# Patient Record
Sex: Female | Born: 1950 | Race: White | Hispanic: No | Smoking: Never smoker
Health system: Southern US, Community
[De-identification: ages and names within clinical notes are randomized; demographics above are authoritative.]

## PROBLEM LIST (undated history)

## (undated) DIAGNOSIS — M797 Fibromyalgia: Secondary | ICD-10-CM

## (undated) DIAGNOSIS — K589 Irritable bowel syndrome without diarrhea: Secondary | ICD-10-CM

## (undated) DIAGNOSIS — M1990 Unspecified osteoarthritis, unspecified site: Secondary | ICD-10-CM

## (undated) DIAGNOSIS — K219 Gastro-esophageal reflux disease without esophagitis: Secondary | ICD-10-CM

## (undated) DIAGNOSIS — J45909 Unspecified asthma, uncomplicated: Secondary | ICD-10-CM

## (undated) DIAGNOSIS — Z5189 Encounter for other specified aftercare: Secondary | ICD-10-CM

## (undated) DIAGNOSIS — K5790 Diverticulosis of intestine, part unspecified, without perforation or abscess without bleeding: Secondary | ICD-10-CM

## (undated) DIAGNOSIS — Z9889 Other specified postprocedural states: Secondary | ICD-10-CM

## (undated) DIAGNOSIS — R011 Cardiac murmur, unspecified: Secondary | ICD-10-CM

## (undated) DIAGNOSIS — I493 Ventricular premature depolarization: Secondary | ICD-10-CM

## (undated) DIAGNOSIS — I499 Cardiac arrhythmia, unspecified: Secondary | ICD-10-CM

## (undated) DIAGNOSIS — R112 Nausea with vomiting, unspecified: Secondary | ICD-10-CM

## (undated) DIAGNOSIS — J301 Allergic rhinitis due to pollen: Secondary | ICD-10-CM

## (undated) DIAGNOSIS — F5104 Psychophysiologic insomnia: Secondary | ICD-10-CM

## (undated) DIAGNOSIS — E785 Hyperlipidemia, unspecified: Secondary | ICD-10-CM

## (undated) DIAGNOSIS — D649 Anemia, unspecified: Secondary | ICD-10-CM

## (undated) DIAGNOSIS — D689 Coagulation defect, unspecified: Secondary | ICD-10-CM

## (undated) DIAGNOSIS — E039 Hypothyroidism, unspecified: Secondary | ICD-10-CM

## (undated) DIAGNOSIS — I10 Essential (primary) hypertension: Secondary | ICD-10-CM

## (undated) DIAGNOSIS — J349 Unspecified disorder of nose and nasal sinuses: Secondary | ICD-10-CM

## (undated) DIAGNOSIS — I4891 Unspecified atrial fibrillation: Secondary | ICD-10-CM

## (undated) HISTORY — DX: Cardiac murmur, unspecified: R01.1

## (undated) HISTORY — PX: HERNIA REPAIR: SHX51

## (undated) HISTORY — DX: Coagulation defect, unspecified: D68.9

## (undated) HISTORY — PX: BACK SURGERY: SHX140

## (undated) HISTORY — DX: Encounter for other specified aftercare: Z51.89

## (undated) HISTORY — PX: EYE SURGERY: SHX253

## (undated) HISTORY — PX: TUBAL LIGATION: SHX77

## (undated) HISTORY — PX: GIVENS CAPSULE STUDY: SHX5432

## (undated) HISTORY — PX: JOINT REPLACEMENT: SHX530

---

## 2005-07-05 ENCOUNTER — Other Ambulatory Visit: Payer: Self-pay

## 2005-07-05 ENCOUNTER — Emergency Department: Payer: Self-pay | Admitting: Unknown Physician Specialty

## 2006-11-07 HISTORY — PX: JOINT REPLACEMENT: SHX530

## 2007-09-11 ENCOUNTER — Other Ambulatory Visit: Payer: Self-pay

## 2007-09-11 ENCOUNTER — Ambulatory Visit: Payer: Self-pay | Admitting: Unknown Physician Specialty

## 2007-09-24 ENCOUNTER — Inpatient Hospital Stay: Payer: Self-pay | Admitting: Unknown Physician Specialty

## 2007-10-26 ENCOUNTER — Encounter: Payer: Self-pay | Admitting: Unknown Physician Specialty

## 2007-11-08 ENCOUNTER — Encounter: Payer: Self-pay | Admitting: Unknown Physician Specialty

## 2007-12-09 ENCOUNTER — Encounter: Payer: Self-pay | Admitting: Unknown Physician Specialty

## 2008-01-06 ENCOUNTER — Encounter: Payer: Self-pay | Admitting: Unknown Physician Specialty

## 2010-06-19 ENCOUNTER — Emergency Department: Payer: Self-pay | Admitting: Unknown Physician Specialty

## 2011-05-23 ENCOUNTER — Ambulatory Visit: Payer: Self-pay

## 2014-02-10 ENCOUNTER — Institutional Professional Consult (permissible substitution): Payer: Self-pay | Admitting: Pulmonary Disease

## 2016-10-11 ENCOUNTER — Other Ambulatory Visit: Payer: Self-pay | Admitting: Family Medicine

## 2016-10-11 DIAGNOSIS — I480 Paroxysmal atrial fibrillation: Secondary | ICD-10-CM | POA: Insufficient documentation

## 2016-10-11 DIAGNOSIS — K219 Gastro-esophageal reflux disease without esophagitis: Secondary | ICD-10-CM | POA: Insufficient documentation

## 2016-10-11 DIAGNOSIS — J301 Allergic rhinitis due to pollen: Secondary | ICD-10-CM | POA: Insufficient documentation

## 2016-10-11 DIAGNOSIS — M797 Fibromyalgia: Secondary | ICD-10-CM | POA: Insufficient documentation

## 2016-10-11 DIAGNOSIS — F5104 Psychophysiologic insomnia: Secondary | ICD-10-CM | POA: Insufficient documentation

## 2016-10-11 DIAGNOSIS — F3342 Major depressive disorder, recurrent, in full remission: Secondary | ICD-10-CM | POA: Insufficient documentation

## 2016-10-11 DIAGNOSIS — Z1231 Encounter for screening mammogram for malignant neoplasm of breast: Secondary | ICD-10-CM

## 2016-10-18 ENCOUNTER — Emergency Department
Admission: EM | Admit: 2016-10-18 | Discharge: 2016-10-18 | Disposition: A | Discharge status: Home / Self Care | Payer: Medicare Other | Attending: Emergency Medicine | Admitting: Emergency Medicine

## 2016-10-18 ENCOUNTER — Encounter: Payer: Self-pay | Admitting: Emergency Medicine

## 2016-10-18 ENCOUNTER — Ambulatory Visit (INDEPENDENT_AMBULATORY_CARE_PROVIDER_SITE_OTHER)
Admission: EM | Admit: 2016-10-18 | Discharge: 2016-10-18 | Disposition: A | Discharge status: Other Acute Inpatient Hospital | Payer: Medicare Other | Source: Home / Self Care | Attending: Family Medicine | Admitting: Family Medicine

## 2016-10-18 ENCOUNTER — Emergency Department: Payer: Medicare Other

## 2016-10-18 DIAGNOSIS — R0789 Other chest pain: Secondary | ICD-10-CM

## 2016-10-18 DIAGNOSIS — I498 Other specified cardiac arrhythmias: Secondary | ICD-10-CM | POA: Insufficient documentation

## 2016-10-18 DIAGNOSIS — Z79899 Other long term (current) drug therapy: Secondary | ICD-10-CM | POA: Insufficient documentation

## 2016-10-18 DIAGNOSIS — Z8679 Personal history of other diseases of the circulatory system: Secondary | ICD-10-CM

## 2016-10-18 DIAGNOSIS — J45909 Unspecified asthma, uncomplicated: Secondary | ICD-10-CM | POA: Insufficient documentation

## 2016-10-18 DIAGNOSIS — I10 Essential (primary) hypertension: Secondary | ICD-10-CM | POA: Insufficient documentation

## 2016-10-18 DIAGNOSIS — R002 Palpitations: Secondary | ICD-10-CM | POA: Insufficient documentation

## 2016-10-18 HISTORY — DX: Unspecified atrial fibrillation: I48.91

## 2016-10-18 HISTORY — DX: Unspecified asthma, uncomplicated: J45.909

## 2016-10-18 HISTORY — DX: Essential (primary) hypertension: I10

## 2016-10-18 LAB — CBC
HCT: 37.1 % (ref 35.0–47.0)
Hemoglobin: 12.3 g/dL (ref 12.0–16.0)
MCH: 28.1 pg (ref 26.0–34.0)
MCHC: 33.2 g/dL (ref 32.0–36.0)
MCV: 84.7 fL (ref 80.0–100.0)
PLATELETS: 340 10*3/uL (ref 150–440)
RBC: 4.39 MIL/uL (ref 3.80–5.20)
RDW: 13.7 % (ref 11.5–14.5)
WBC: 9.4 10*3/uL (ref 3.6–11.0)

## 2016-10-18 LAB — BASIC METABOLIC PANEL
Anion gap: 8 (ref 5–15)
BUN: 12 mg/dL (ref 6–20)
CHLORIDE: 109 mmol/L (ref 101–111)
CO2: 23 mmol/L (ref 22–32)
CREATININE: 0.7 mg/dL (ref 0.44–1.00)
Calcium: 9.9 mg/dL (ref 8.9–10.3)
GFR calc non Af Amer: >60 mL/min (ref >60)
Glucose, Bld: 92 mg/dL (ref 65–99)
Potassium: 3.7 mmol/L (ref 3.5–5.1)
SODIUM: 140 mmol/L (ref 135–145)

## 2016-10-18 LAB — TROPONIN I

## 2016-10-18 MED ORDER — ASPIRIN 81 MG PO CHEW
324.0000 mg | CHEWABLE_TABLET | Freq: Once | ORAL | Status: AC
  Administered 2016-10-18: 324 mg via ORAL

## 2016-10-18 NOTE — ED Triage Notes (Signed)
Patient c/o chest pain and palpitation that started 7am this morning.

## 2016-10-18 NOTE — Discharge Instructions (Signed)
Please seek medical attention for any high fevers, chest pain, shortness of breath, change in behavior, persistent vomiting, bloody stool or any other new or concerning symptoms.  

## 2016-10-18 NOTE — ED Provider Notes (Signed)
[facility] Emergency Department Provider Note  ____________________________________________   I have reviewed the triage vital signs and the nursing notes.   HISTORY  Chief Complaint Palpitations   History limited by: Not Limited   HPI Mary Irwin is a 65 y.o. female who presents to the emergency department today because of concern for palpitations. The palpitations started roughly 12 hours prior to my evaluation. The patient describes them as an odd feeling in her chest. There was no associated chest pain. There was no associated shortness of breath. The patient had just woken up when she first appreciated the odd feeling. The patient states that they were worsened when she sat up. The patient has had palpitations in the past. History of atrial fibrillation. Is on Cardizem. No recent illness or fevers. No excessive caffeine or alcohol use recently.   Past Medical History:  Diagnosis Date  . Asthma   . Atrial fibrillation ([place])   . Hypertension     There are no active problems to display for this patient.   History reviewed. No pertinent surgical history.  Prior to Admission medications   Medication Sig Start Date End Date Taking? Authorizing Provider  albuterol (PROVENTIL HFA;VENTOLIN HFA) 108 (90 Base) MCG/ACT inhaler Inhale 2 puffs into the lungs every 6 (six) hours as needed for wheezing or shortness of breath.    Historical Provider, MD  diltiazem (CARDIZEM CD) 120 MG 24 hr capsule Take 120 mg by mouth daily.    Historical Provider, MD  fluticasone furoate-vilanterol (BREO ELLIPTA) 200-25 MCG/INH AEPB Inhale 1 puff into the lungs daily.    Historical Provider, MD  gabapentin (NEURONTIN) 300 MG capsule Take 300 mg by mouth 2 (two) times daily.    Historical Provider, MD  lisinopril (PRINIVIL,ZESTRIL) 20 MG tablet Take 20 mg by mouth daily.    Historical Provider, MD  omeprazole (PRILOSEC) 40 MG capsule Take 40 mg by mouth daily.    Historical  Provider, MD    Allergies Patient has no known allergies.  No family history on file.  Social History Social History  Substance Use Topics  . Smoking status: Never Smoker  . Smokeless tobacco: Never Used  . Alcohol use No    Review of Systems  Constitutional: Negative for fever. Cardiovascular: Positive for palpitations. Negative for chest pain. Respiratory: Negative for shortness of breath. Gastrointestinal: Negative for abdominal pain, vomiting and diarrhea. Neurological: Negative for headaches, focal weakness or numbness.  10-point ROS otherwise negative.  ____________________________________________   PHYSICAL EXAM:  VITAL SIGNS: ED Triage Vitals  Enc Vitals Group     BP 10/18/16 1544 (!) 154/62     Pulse Rate 10/18/16 1544 75     Resp 10/18/16 1544 20     Temp 10/18/16 1544 98.1 F (36.7 C)     Temp Source 10/18/16 1544 Oral     SpO2 10/18/16 1544 99 %     Weight 10/18/16 1545 204 lb (92.5 kg)     Height 10/18/16 1545 5\' 5"  (1.651 m)     Head Circumference --      Peak Flow --      Pain Score --      Pain Loc --      Pain Edu? --      Excl. in [place]? --      Constitutional: Alert and oriented. Well appearing and in no distress. Eyes: Conjunctivae are normal. Normal extraocular movements. ENT   Head: Normocephalic and atraumatic.   Nose: No congestion/rhinnorhea.  Mouth/Throat: Mucous membranes are moist.   Neck: No stridor. Hematological/Lymphatic/Immunilogical: No cervical lymphadenopathy. Cardiovascular: Normal rate, regular rhythm.  No murmurs, rubs, or gallops. Did appreciate one skipped beat while listening to the heart. Respiratory: Normal respiratory effort without tachypnea nor retractions. Breath sounds are clear and equal bilaterally. No wheezes/rales/rhonchi. Musculoskeletal: Normal range of motion in all extremities. Neurologic:  Normal speech and language. No gross focal neurologic deficits are appreciated.  Skin:  Skin is  warm, dry and intact. No rash noted. Psychiatric: Mood and affect are normal. Speech and behavior are normal. Patient exhibits appropriate insight and judgment.  ____________________________________________    LABS (pertinent positives/negatives)  Labs Reviewed  BASIC METABOLIC PANEL  CBC  TROPONIN I     ____________________________________________   EKG  I, Nance Pear, attending physician, personally viewed and interpreted this EKG  EKG Time: 1424 Rate: 84 Rhythm: normal sinus rhythm Axis: normal Intervals: qtc 441 QRS: narrow ST changes: no st elevation Impression: normal ekg  ____________________________________________    RADIOLOGY  CXR IMPRESSION:  Mild chronic bronchitic changes. No alveolar pneumonia nor CHF.    Thoracic aortic atherosclerosis.   ____________________________________________   PROCEDURES  Procedures  ____________________________________________   INITIAL IMPRESSION / ASSESSMENT AND PLAN / ED COURSE  Pertinent labs & imaging results that were available during my care of the patient were reviewed by me and considered in my medical decision making (see chart for details).  Patient presented to the emergency department today because of concerns for palpitations. Patient states she does have history of A. fib. EKG here without any concerning findings. Blood work without any elevation troponin. Chest x-ray normal. This point did it sound like I heard one likely PVC on auscultation. The patient patient's EKG without any concerning findings. Will discharge. Will give patient cardiology follow-up information.  ____________________________________________   FINAL CLINICAL IMPRESSION(S) / ED DIAGNOSES  Palpitations  Note: This dictation was prepared with Dragon dictation. Any transcriptional errors that result from this process are unintentional      Nance Pear, MD 10/18/16 1920

## 2016-10-18 NOTE — ED Provider Notes (Signed)
[facility]    CSN: [id number] Arrival date & time: 10/18/16  1404     History   Chief Complaint Chief Complaint  Patient presents with  . Chest Pain  . Tachycardia    HPI Mary Irwin is a 65 y.o. female.   Patient is a 65 year old white female who's complaining of atypical chest pain. Patient reports the chest pain started this morning. She reports is a chest heaviness versus a true chest pain. It does not radiate. Should she feel heart fluttering and jumping. She does have a new diagnosis of A. fib which occurred this summer when she was living in [place]. She was transported by EMS to the local hospital there and actually admitted and started on Cardizem. She states that she was going up reported on a blood thinner but her insurance would not by it or pain for so she was put on 1 baby aspirin a day. Habits she does not smoke or dip snuff her to tobacco. She does have a history of multiple medical problems she has asthma A. fib fibromyalgia and hypothyroidism. 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 She has a sisters had A. fib but no history of sudden death in the immediate family. She once again reports this is a chest tightness and not really pain but she can't feel her heart skipping she states he has associated with a new primary care doctor since she's moved from [place] and they were going to refer to cardiologist referral has not taken place for the recent. Called her PCP twice today but she received no answer she was at the urgent care taking her sister to have an ultrasound of her hip who had recent hip replacement so she decided to come to the urgent care to be seen and evaluated.   The history is provided by the patient. No language interpreter was used.  Chest Pain  Pain location:  Unable to specify Pain quality: aching and shooting   Pain severity:  Moderate Onset quality:  Sudden Timing:  Constant Progression:  Waxing and  waning Chronicity:  New Context: breathing   Relieved by:  Nothing Worsened by:  Nothing Ineffective treatments:  None tried Associated symptoms: palpitations and shortness of breath     Past Medical History:  Diagnosis Date  . Asthma   . Atrial fibrillation ([place])   . Hypertension     There are no active problems to display for this patient.   History reviewed. No pertinent surgical history.  OB History    No data available       Home Medications    Prior to Admission medications   Medication Sig Start Date End Date Taking? Authorizing Provider  albuterol (PROVENTIL HFA;VENTOLIN HFA) 108 (90 Base) MCG/ACT inhaler Inhale 2 puffs into the lungs every 6 (six) hours as needed for wheezing or shortness of breath.   Yes Historical Provider, MD  diltiazem (CARDIZEM CD) 120 MG 24 hr capsule Take 120 mg by mouth daily.   Yes Historical Provider, MD  fluticasone furoate-vilanterol (BREO ELLIPTA) 200-25 MCG/INH AEPB Inhale 1 puff into the lungs daily.   Yes Historical Provider, MD  gabapentin (NEURONTIN) 300 MG capsule Take 300 mg by mouth 2 (two) times daily.   Yes Historical Provider, MD  lisinopril (PRINIVIL,ZESTRIL) 20 MG tablet Take 20 mg by mouth daily.   Yes Historical Provider, MD  omeprazole (PRILOSEC) 40 MG capsule Take 40 mg by mouth daily.   Yes Historical  Provider, MD    Family History History reviewed. No pertinent family history.  Social History Social History  Substance Use Topics  . Smoking status: Never Smoker  . Smokeless tobacco: Never Used  . Alcohol use No     Allergies   Patient has no known allergies.   Review of Systems Review of Systems  Respiratory: Positive for chest tightness and shortness of breath.   Cardiovascular: Positive for chest pain and palpitations.  All other systems reviewed and are negative.    Physical Exam Triage Vital Signs ED Triage Vitals  Enc Vitals Group     BP 10/18/16 1426 (!) 171/70     Pulse Rate 10/18/16  1426 83     Resp 10/18/16 1426 17     Temp 10/18/16 1426 98.4 F (36.9 C)     Temp Source 10/18/16 1426 Oral     SpO2 10/18/16 1426 100 %     Weight 10/18/16 1420 204 lb (92.5 kg)     Height 10/18/16 1420 5\' 5"  (1.651 m)     Head Circumference --      Peak Flow --      Pain Score 10/18/16 1422 4     Pain Loc --      Pain Edu? --      Excl. in [place]? --    No data found.   Updated Vital Signs BP (!) 171/70 (BP Location: Left Arm)   Pulse 83   Temp 98.4 F (36.9 C) (Oral)   Resp 17   Ht 5\' 5"  (1.651 m)   Wt 204 lb (92.5 kg)   SpO2 100%   BMI 33.95 kg/m   Visual Acuity Right Eye Distance:   Left Eye Distance:   Bilateral Distance:    Right Eye Near:   Left Eye Near:    Bilateral Near:     Physical Exam  Constitutional: She is oriented to person, place, and time. She appears well-developed and well-nourished.  HENT:  Head: Normocephalic and atraumatic.  Left Ear: External ear normal.  Eyes: Pupils are equal, round, and reactive to light.  Neck: Normal range of motion. Neck supple.  Cardiovascular: Normal rate and normal heart sounds.  An irregular rhythm present.  Pulmonary/Chest: Effort normal and breath sounds normal. No respiratory distress.  Abdominal: Soft. Bowel sounds are normal. She exhibits no distension.  Musculoskeletal: Normal range of motion. She exhibits no edema or tenderness.  Neurological: She is alert and oriented to person, place, and time. No cranial nerve deficit.  Skin: Skin is warm and dry.  Psychiatric: She has a normal mood and affect. Her behavior is normal.  Vitals reviewed.    UC Treatments / Results  Labs (all labs ordered are listed, but only abnormal results are displayed) Labs Reviewed - No data to display  EKG  EKG Interpretation None      ED ECG REPORT I, Nikoleta Dady H, the attending physician, personally viewed and interpreted this ECG.   Date: 10/18/2016  EKG Time:14:24:06  Rate: 82  Rhythm: there are no previous  tracings available for comparison, nonspecific ST and T waves changes, sinus arrthymia  Axis: 82  Intervals:none and none  ST&T Change:Nonspecific ST changes  Radiology No results found.  Procedures Procedures (including critical care time)  Medications Ordered in UC Medications  aspirin chewable tablet 324 mg (324 mg Oral Given 10/18/16 1457)     Initial Impression / Assessment and Plan / UC Course  I have reviewed the triage vital  signs and the nursing notes.  Pertinent labs & imaging results that were available during my care of the patient were reviewed by me and considered in my medical decision making (see chart for details).  Clinical Course    Explained patient EKG showed normal sinus rhythm with some sinus arrhythmia and nonspecific ST changes. No EKG from [place] is available. Explained this could be the beginning of something worse strongly suggest she goes to the ED of her choice [facility] ED was notified of patient's admit arrival and discussed with charge nurse Colletta [place]. Patient was given 4 baby aspirin as before she left. Patient states that she wants to take her sister home I suggested that she go to [facility] ED by ambulance which she refuses and since she's gone go by PCP I recommend that she gets we'll also drop her sister off and that she sees [facility] ED ASAP.   Final Clinical Impressions(s) / UC Diagnoses   Final diagnoses:  None    New Prescriptions Discharge Medication List as of 10/18/2016  3:00 PM       Note: This dictation was prepared with Dragon dictation along with smaller phrase technology. Any transcriptional errors that result from this process are unintentional.   Frederich Cha, MD 10/18/16 1534

## 2016-10-25 DIAGNOSIS — I10 Essential (primary) hypertension: Secondary | ICD-10-CM | POA: Insufficient documentation

## 2016-10-25 DIAGNOSIS — J45909 Unspecified asthma, uncomplicated: Secondary | ICD-10-CM | POA: Insufficient documentation

## 2016-10-25 DIAGNOSIS — E079 Disorder of thyroid, unspecified: Secondary | ICD-10-CM | POA: Insufficient documentation

## 2016-10-25 DIAGNOSIS — E785 Hyperlipidemia, unspecified: Secondary | ICD-10-CM | POA: Insufficient documentation

## 2017-01-03 ENCOUNTER — Ambulatory Visit: Admission: RE | Admit: 2017-01-03 | Payer: Medicare Other | Source: Ambulatory Visit

## 2017-01-05 ENCOUNTER — Ambulatory Visit
Admission: RE | Admit: 2017-01-05 | Discharge: 2017-01-05 | Disposition: A | Discharge status: Home / Self Care | Payer: Medicare Other | Source: Ambulatory Visit | Attending: Medical Oncology | Admitting: Medical Oncology

## 2017-01-05 ENCOUNTER — Other Ambulatory Visit: Payer: Self-pay | Admitting: Medical Oncology

## 2017-01-05 DIAGNOSIS — Z1231 Encounter for screening mammogram for malignant neoplasm of breast: Secondary | ICD-10-CM | POA: Diagnosis present

## 2017-02-01 ENCOUNTER — Ambulatory Visit
Admission: RE | Admit: 2017-02-01 | Discharge: 2017-02-01 | Disposition: A | Discharge status: Home / Self Care | Payer: Medicare Other | Source: Ambulatory Visit | Attending: Medical Oncology | Admitting: Medical Oncology

## 2017-02-01 ENCOUNTER — Other Ambulatory Visit: Payer: Self-pay | Admitting: Medical Oncology

## 2017-02-01 ENCOUNTER — Ambulatory Visit: Admission: RE | Admit: 2017-02-01 | Payer: Medicare Other | Source: Ambulatory Visit

## 2017-02-01 DIAGNOSIS — M79604 Pain in right leg: Secondary | ICD-10-CM

## 2017-02-01 DIAGNOSIS — M25562 Pain in left knee: Secondary | ICD-10-CM | POA: Diagnosis not present

## 2017-02-01 DIAGNOSIS — R2243 Localized swelling, mass and lump, lower limb, bilateral: Secondary | ICD-10-CM

## 2017-02-01 DIAGNOSIS — R609 Edema, unspecified: Secondary | ICD-10-CM | POA: Diagnosis not present

## 2017-02-01 DIAGNOSIS — M79605 Pain in left leg: Principal | ICD-10-CM

## 2017-02-01 DIAGNOSIS — Z780 Asymptomatic menopausal state: Secondary | ICD-10-CM

## 2017-02-01 DIAGNOSIS — Z0000 Encounter for general adult medical examination without abnormal findings: Secondary | ICD-10-CM

## 2017-02-01 DIAGNOSIS — Z13820 Encounter for screening for osteoporosis: Secondary | ICD-10-CM

## 2017-02-12 ENCOUNTER — Ambulatory Visit
Admission: EM | Admit: 2017-02-12 | Discharge: 2017-02-12 | Disposition: A | Discharge status: Home / Self Care | Payer: Medicare Other | Attending: Family | Admitting: Family

## 2017-02-12 DIAGNOSIS — M25551 Pain in right hip: Secondary | ICD-10-CM | POA: Diagnosis not present

## 2017-02-12 DIAGNOSIS — J4541 Moderate persistent asthma with (acute) exacerbation: Secondary | ICD-10-CM | POA: Diagnosis not present

## 2017-02-12 DIAGNOSIS — J069 Acute upper respiratory infection, unspecified: Secondary | ICD-10-CM | POA: Diagnosis not present

## 2017-02-12 DIAGNOSIS — B9789 Other viral agents as the cause of diseases classified elsewhere: Secondary | ICD-10-CM

## 2017-02-12 MED ORDER — BENZONATATE 100 MG PO CAPS: 100.0000 mg | ORAL_CAPSULE | Freq: Three times a day (TID) | ORAL | 0 refills | Status: DC | PRN

## 2017-02-12 MED ORDER — PREDNISONE 10 MG (21) PO TBPK: ORAL_TABLET | ORAL | 0 refills | Status: DC

## 2017-02-12 NOTE — Discharge Instructions (Signed)
Recommend start Prednisone 10mg  6 day dose pack as directed. May take Tessalon cough pills 1 every 8 hours as needed. Increase fluid intake to help loosen up mucus. Use Albuterol inhaler sparingly since it has caused irregular heart rate in the past. May continue Tylenol as needed for fever. Follow-up with your primary care provider in 3 to 4 days if not improving.

## 2017-02-12 NOTE — ED Provider Notes (Signed)
CSN: [id number]     Arrival date & time 02/12/17  1405 History   First MD Initiated Contact with Patient 02/12/17 1525     Chief Complaint  Patient presents with  . Cough  . Hip Pain    right   (Consider location/radiation/quality/duration/timing/severity/associated sxs/prior Treatment) 66 year old female presents with 2 major concerns. 1st is low grade fever and cough for the past 3 days. Cough was productive yesterday but now more of a dry cough. Denies any nasal congestion, sore throat, vomiting or diarrhea. Has taken Tylenol with some relief. No other family members have been ill. Has history of asthma and the cough is causing an increase in shortness of breath and wheezes. She is currently on Breo but is unable to take her Albuterol due to increase in palpitations from the inhaler recently. 2nd concern is right sided hip pain that has been occurring for the past week. She was sitting in a patio chair and when she tried to get up, she started experiencing low back pain that has spread to her right hip. No distinct injury but has had chronic back problems as well as both hips replaced about 10 years ago. She has been applying ice to her hip with some success. 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    The history is provided by the patient.    Past Medical History:  Diagnosis Date  . Asthma   . Atrial fibrillation ([place])   . Hypertension    History reviewed. No pertinent surgical history. Family History  Problem Relation Age of Onset  . Breast cancer Maternal Aunt 64   Social History  Substance Use Topics  . Smoking status: Never Smoker  . Smokeless tobacco: Never Used  . Alcohol use No   OB History    No data available     Review of Systems  Constitutional: Positive for fatigue and fever. Negative for appetite change and chills.  HENT: Positive for postnasal drip. Negative for congestion, ear discharge, ear pain, rhinorrhea, sinus pain,  sinus pressure, sore throat and trouble swallowing.   Eyes: Negative for discharge.  Respiratory: Positive for cough, chest tightness, shortness of breath and wheezing.   Cardiovascular: Negative for chest pain.  Gastrointestinal: Positive for nausea. Negative for abdominal pain, diarrhea and vomiting.  Genitourinary: Negative for decreased urine volume, difficulty urinating, dysuria and vaginal discharge.  Musculoskeletal: Positive for arthralgias, back pain and myalgias. Negative for joint swelling, neck pain and neck stiffness.  Skin: Negative for rash and wound.  Neurological: Negative for dizziness, seizures, syncope, weakness and headaches.  Hematological: Negative for adenopathy.    Allergies  Patient has no known allergies.  Home Medications   Prior to Admission medications   Medication Sig Start Date End Date Taking? Authorizing Provider  albuterol (PROVENTIL HFA;VENTOLIN HFA) 108 (90 Base) MCG/ACT inhaler Inhale 2 puffs into the lungs every 6 (six) hours as needed for wheezing or shortness of breath.    Historical Provider, MD  benzonatate (TESSALON) 100 MG capsule Take 1 capsule (100 mg total) by mouth 3 (three) times daily as needed for cough. 02/12/17   Katy Apo, NP  diltiazem (CARDIZEM CD) 120 MG 24 hr capsule Take 120 mg by mouth daily.    Historical Provider, MD  fluticasone furoate-vilanterol (BREO ELLIPTA) 200-25 MCG/INH AEPB Inhale 1 puff into the lungs daily.    Historical Provider, MD  gabapentin (NEURONTIN) 300 MG capsule Take 300 mg by mouth  2 (two) times daily.    Historical Provider, MD  lisinopril (PRINIVIL,ZESTRIL) 20 MG tablet Take 20 mg by mouth daily.    Historical Provider, MD  omeprazole (PRILOSEC) 40 MG capsule Take 40 mg by mouth daily.    Historical Provider, MD  predniSONE (STERAPRED UNI-PAK 21 TAB) 10 MG (21) TBPK tablet Take 6 tabs by mouth 1st day then decrease by 1 tablet daily until finished. 02/12/17   Katy Apo, NP   Meds Ordered and  Administered this Visit  Medications - No data to display  BP (!) 135/55 (BP Location: Left Arm)   Pulse (!) 105   Temp 98.4 F (36.9 C) (Oral)   Resp 18   Ht 5\' 4"  (1.626 m)   Wt 200 lb (90.7 kg)   SpO2 100%   BMI 34.33 kg/m  No data found.   Physical Exam  Constitutional: She is oriented to person, place, and time. She appears well-developed and well-nourished. No distress.  HENT:  Head: Normocephalic and atraumatic.  Right Ear: Hearing, tympanic membrane, external ear and ear canal normal.  Left Ear: Hearing, tympanic membrane, external ear and ear canal normal.  Nose: Nose normal. Right sinus exhibits no maxillary sinus tenderness and no frontal sinus tenderness. Left sinus exhibits no maxillary sinus tenderness and no frontal sinus tenderness.  Mouth/Throat: Uvula is midline, oropharynx is clear and moist and mucous membranes are normal.  Neck: Normal range of motion. Neck supple.  Cardiovascular: Regular rhythm and normal heart sounds.  Tachycardia present.   No murmur heard. Pulmonary/Chest: Effort normal. No respiratory distress. She has decreased breath sounds in the right upper field, the right lower field, the left upper field and the left lower field. She has no wheezes. She has rhonchi in the right upper field and the left upper field. She has no rales.  Musculoskeletal: She exhibits tenderness. She exhibits no edema or deformity.       Right hip: She exhibits tenderness. She exhibits normal range of motion, normal strength, no swelling, no crepitus and no deformity.       Legs: Has full range of motion of right hip but with pain on flexion and full extension. Tender along lower lumbar area as well as at iliac crest. No swelling, redness or distinct muscle spasms present. Pulses are normal distally with no distinct edema. No neuro deficits noted.   Lymphadenopathy:    She has no cervical adenopathy.  Neurological: She is alert and oriented to person, place, and time.  She has normal strength. No cranial nerve deficit or sensory deficit.  Skin: Skin is warm and dry. Capillary refill takes less than 2 seconds. No rash noted.  Psychiatric: She has a normal mood and affect. Her behavior is normal. Judgment and thought content normal.    Urgent Care Course     Procedures (including critical care time)  Labs Review Labs Reviewed - No data to display  Imaging Review No results found.   Visual Acuity Review  Right Eye Distance:   Left Eye Distance:   Bilateral Distance:    Right Eye Near:   Left Eye Near:    Bilateral Near:         MDM   1. Right hip pain   2. Moderate persistent asthma with acute exacerbation   3. Viral URI with cough    Discussed that she probably has a upper respiratory viral infection causing her cough. Recommend Tessalon cough pills 1 every 8 hours as needed. Increase  fluid intake to help loosen up mucus. May continue Tylenol as needed for fever. Due to asthma exacerbation and right hip pain, recommend trial Prednisone 10mg  6 day dose pack as directed. Minimize use of Albuterol since it aggravates irregular heart rate. Discussed that right lower back pain and hip pain are probably due to musculoskeletal strain. May continue applying ice as needed. Declines muscle relaxer. May need to see the Orthopedic again for further evaluation if hip pain persists. Also recommend follow-up with her PCP if cough does not improve within 3 to 4 days.     Katy Apo, NP 02/12/17 2233

## 2017-02-12 NOTE — ED Triage Notes (Addendum)
Pt c/o cough for the last 3 days, 101 fever at home yesterday. Pt has disc problems but about a week ago she was getting out of a chair and her back started to hurt, she has had both hips replaced about 10 years ago. and she has been keeping ice on it. But states her right hip is hurting. Feels like a pulled muscle in the hip

## 2017-02-13 ENCOUNTER — Ambulatory Visit: Admission: RE | Admit: 2017-02-13 | Payer: Medicare Other | Source: Ambulatory Visit

## 2017-02-20 ENCOUNTER — Ambulatory Visit
Admission: RE | Admit: 2017-02-20 | Discharge: 2017-02-20 | Disposition: A | Discharge status: Home / Self Care | Payer: Medicare Other | Source: Ambulatory Visit | Attending: Medical Oncology | Admitting: Medical Oncology

## 2017-02-20 DIAGNOSIS — Z13820 Encounter for screening for osteoporosis: Secondary | ICD-10-CM | POA: Insufficient documentation

## 2017-02-20 DIAGNOSIS — Z0000 Encounter for general adult medical examination without abnormal findings: Secondary | ICD-10-CM

## 2017-02-20 DIAGNOSIS — Z780 Asymptomatic menopausal state: Secondary | ICD-10-CM | POA: Insufficient documentation

## 2017-02-28 ENCOUNTER — Other Ambulatory Visit: Payer: Self-pay | Admitting: Orthopedic Surgery

## 2017-02-28 DIAGNOSIS — S22060A Wedge compression fracture of T7-T8 vertebra, initial encounter for closed fracture: Secondary | ICD-10-CM

## 2017-03-08 ENCOUNTER — Ambulatory Visit
Admission: RE | Admit: 2017-03-08 | Discharge: 2017-03-08 | Disposition: A | Discharge status: Home / Self Care | Payer: Medicare Other | Source: Ambulatory Visit | Attending: Orthopedic Surgery | Admitting: Orthopedic Surgery

## 2017-03-08 DIAGNOSIS — M47814 Spondylosis without myelopathy or radiculopathy, thoracic region: Secondary | ICD-10-CM | POA: Insufficient documentation

## 2017-03-08 DIAGNOSIS — S22060A Wedge compression fracture of T7-T8 vertebra, initial encounter for closed fracture: Secondary | ICD-10-CM | POA: Diagnosis present

## 2017-05-18 ENCOUNTER — Other Ambulatory Visit: Payer: Self-pay | Admitting: Orthopedic Surgery

## 2017-05-18 DIAGNOSIS — M5416 Radiculopathy, lumbar region: Secondary | ICD-10-CM

## 2017-05-22 ENCOUNTER — Encounter: Payer: Self-pay | Admitting: *Deleted

## 2017-05-23 ENCOUNTER — Ambulatory Visit
Admission: RE | Admit: 2017-05-23 | Discharge: 2017-05-23 | Disposition: A | Discharge status: Home / Self Care | Payer: Medicare Other | Source: Ambulatory Visit | Attending: Gastroenterology | Admitting: Gastroenterology

## 2017-05-23 ENCOUNTER — Ambulatory Visit: Payer: Medicare Other | Admitting: Anesthesiology

## 2017-05-23 ENCOUNTER — Encounter
Admission: RE | Disposition: A | Discharge status: Home / Self Care | Payer: Self-pay | Source: Ambulatory Visit | Attending: Gastroenterology

## 2017-05-23 ENCOUNTER — Encounter: Payer: Self-pay | Admitting: *Deleted

## 2017-05-23 DIAGNOSIS — K644 Residual hemorrhoidal skin tags: Secondary | ICD-10-CM | POA: Diagnosis not present

## 2017-05-23 DIAGNOSIS — R1013 Epigastric pain: Secondary | ICD-10-CM | POA: Insufficient documentation

## 2017-05-23 DIAGNOSIS — I10 Essential (primary) hypertension: Secondary | ICD-10-CM | POA: Insufficient documentation

## 2017-05-23 DIAGNOSIS — Q438 Other specified congenital malformations of intestine: Secondary | ICD-10-CM | POA: Diagnosis not present

## 2017-05-23 DIAGNOSIS — R1011 Right upper quadrant pain: Secondary | ICD-10-CM | POA: Insufficient documentation

## 2017-05-23 DIAGNOSIS — K219 Gastro-esophageal reflux disease without esophagitis: Secondary | ICD-10-CM | POA: Diagnosis not present

## 2017-05-23 DIAGNOSIS — Z7982 Long term (current) use of aspirin: Secondary | ICD-10-CM | POA: Diagnosis not present

## 2017-05-23 DIAGNOSIS — Z1211 Encounter for screening for malignant neoplasm of colon: Secondary | ICD-10-CM | POA: Diagnosis present

## 2017-05-23 DIAGNOSIS — E039 Hypothyroidism, unspecified: Secondary | ICD-10-CM | POA: Diagnosis not present

## 2017-05-23 DIAGNOSIS — J385 Laryngeal spasm: Secondary | ICD-10-CM | POA: Insufficient documentation

## 2017-05-23 DIAGNOSIS — Z79899 Other long term (current) drug therapy: Secondary | ICD-10-CM | POA: Diagnosis not present

## 2017-05-23 DIAGNOSIS — K5730 Diverticulosis of large intestine without perforation or abscess without bleeding: Secondary | ICD-10-CM | POA: Insufficient documentation

## 2017-05-23 DIAGNOSIS — R0902 Hypoxemia: Secondary | ICD-10-CM | POA: Insufficient documentation

## 2017-05-23 DIAGNOSIS — Z791 Long term (current) use of non-steroidal anti-inflammatories (NSAID): Secondary | ICD-10-CM | POA: Diagnosis not present

## 2017-05-23 DIAGNOSIS — I4891 Unspecified atrial fibrillation: Secondary | ICD-10-CM | POA: Diagnosis not present

## 2017-05-23 DIAGNOSIS — F329 Major depressive disorder, single episode, unspecified: Secondary | ICD-10-CM | POA: Diagnosis not present

## 2017-05-23 HISTORY — DX: Cardiac arrhythmia, unspecified: I49.9

## 2017-05-23 HISTORY — DX: Hypothyroidism, unspecified: E03.9

## 2017-05-23 HISTORY — PX: COLONOSCOPY WITH PROPOFOL: SHX5780

## 2017-05-23 HISTORY — PX: ESOPHAGOGASTRODUODENOSCOPY (EGD) WITH PROPOFOL: SHX5813

## 2017-05-23 SURGERY — COLONOSCOPY WITH PROPOFOL
Anesthesia: General

## 2017-05-23 MED ORDER — GLYCOPYRROLATE 0.2 MG/ML IJ SOLN
INTRAMUSCULAR | Status: AC
  Filled 2017-05-23: qty 1

## 2017-05-23 MED ORDER — ROCURONIUM BROMIDE 50 MG/5ML IV SOLN
INTRAVENOUS | Status: AC
  Filled 2017-05-23: qty 1

## 2017-05-23 MED ORDER — ESMOLOL HCL 100 MG/10ML IV SOLN
INTRAVENOUS | Status: DC | PRN
  Administered 2017-05-23: 30 mg via INTRAVENOUS

## 2017-05-23 MED ORDER — PROPOFOL 10 MG/ML IV BOLUS
INTRAVENOUS | Status: DC | PRN
  Administered 2017-05-23: 20 mg via INTRAVENOUS
  Administered 2017-05-23 (×2): 30 mg via INTRAVENOUS

## 2017-05-23 MED ORDER — SODIUM CHLORIDE 0.9 % IV SOLN: INTRAVENOUS | Status: DC

## 2017-05-23 MED ORDER — LIDOCAINE HCL (PF) 2 % IJ SOLN
INTRAMUSCULAR | Status: AC
  Filled 2017-05-23: qty 2

## 2017-05-23 MED ORDER — ESMOLOL HCL 100 MG/10ML IV SOLN
INTRAVENOUS | Status: AC
  Filled 2017-05-23: qty 10

## 2017-05-23 MED ORDER — PROPOFOL 10 MG/ML IV BOLUS
INTRAVENOUS | Status: AC
  Filled 2017-05-23: qty 20

## 2017-05-23 MED ORDER — PROPOFOL 500 MG/50ML IV EMUL
INTRAVENOUS | Status: AC
  Filled 2017-05-23: qty 50

## 2017-05-23 MED ORDER — LIDOCAINE HCL (CARDIAC) 20 MG/ML IV SOLN
INTRAVENOUS | Status: DC | PRN
  Administered 2017-05-23: 2 mL via INTRAVENOUS

## 2017-05-23 MED ORDER — SODIUM CHLORIDE 0.9 % IV SOLN
INTRAVENOUS | Status: DC
  Administered 2017-05-23: 1000 mL via INTRAVENOUS

## 2017-05-23 MED ORDER — PROPOFOL 500 MG/50ML IV EMUL
INTRAVENOUS | Status: DC | PRN
  Administered 2017-05-23: 120 ug/kg/min via INTRAVENOUS

## 2017-05-23 NOTE — Anesthesia Preprocedure Evaluation (Signed)
Anesthesia Evaluation  Patient identified by MRN, date of birth, ID band Patient awake    Reviewed: Allergy & Precautions, NPO status , Patient's Chart, lab work & pertinent test results  Airway Mallampati: III       Dental  (+) Teeth Intact   Pulmonary asthma , sleep apnea ,     + decreased breath sounds      Cardiovascular Exercise Tolerance: Good hypertension, Pt. on medications + dysrhythmias Atrial Fibrillation  Rhythm:Regular     Neuro/Psych negative neurological ROS     GI/Hepatic negative GI ROS, Neg liver ROS,   Endo/Other  Hypothyroidism Morbid obesity  Renal/GU negative Renal ROS     Musculoskeletal   Abdominal (+) + obese,   Peds  Hematology   Anesthesia Other Findings   Reproductive/Obstetrics                             Anesthesia Physical Anesthesia Plan  ASA: III  Anesthesia Plan: General   Post-op Pain Management:    Induction: Intravenous  PONV Risk Score and Plan: 0  Airway Management Planned: Natural Airway  Additional Equipment:   Intra-op Plan:   Post-operative Plan:   Informed Consent: I have reviewed the patients History and Physical, chart, labs and discussed the procedure including the risks, benefits and alternatives for the proposed anesthesia with the patient or authorized representative who has indicated his/her understanding and acceptance.     Plan Discussed with: Surgeon  Anesthesia Plan Comments:         Anesthesia Quick Evaluation

## 2017-05-23 NOTE — Anesthesia Postprocedure Evaluation (Signed)
Anesthesia Post Note  Patient: Mary Irwin  Procedure(s) Performed: Procedure(s) (LRB): COLONOSCOPY WITH PROPOFOL (N/A) ESOPHAGOGASTRODUODENOSCOPY (EGD) WITH PROPOFOL (N/A)  Patient location during evaluation: PACU Anesthesia Type: General Level of consciousness: awake Pain management: pain level controlled Vital Signs Assessment: post-procedure vital signs reviewed and stable Respiratory status: spontaneous breathing Cardiovascular status: stable Anesthetic complications: no     Last Vitals:  Vitals:   05/23/17 1132 05/23/17 1142  BP: 124/60 124/69  Pulse: 96   Resp: (!) 22 (!) 25  Temp: 36.8 C     Last Pain:  Vitals:   05/23/17 1132  TempSrc: Tympanic  PainSc: Asleep                 VAN STAVEREN,Islay Polanco

## 2017-05-23 NOTE — Anesthesia Post-op Follow-up Note (Signed)
Anesthesia QCDR form completed.        

## 2017-05-23 NOTE — H&P (Signed)
Outpatient short stay form Pre-procedure 05/23/2017 10:18 AM Mary Sails MD  Primary Physician: Ellison Carwin PA  Reason for visit:  EGD and colonoscopy  History of present illness:  Patient is a 66 year old female presenting today as above. She has complained of some reflux issues however also there is some epigastric discomfort as well as right upper quadrant discomfort. There is a fullness in this area. This will give her discomfort that then radiates around to her back "under my shoulder blade. She tolerated her prep well. She does take L liquids which is been held for 4 days. She takes no other blood thinning agents or aspirin products.    Current Facility-Administered Medications:  .  0.9 %  sodium chloride infusion, , Intravenous, Continuous, Mary Sails, MD .  0.9 %  sodium chloride infusion, , Intravenous, Continuous, Mary Sails, MD  Prescriptions Prior to Admission  Medication Sig Dispense Refill Last Dose  . apixaban (ELIQUIS) 5 MG TABS tablet Take 5 mg by mouth 2 (two) times daily.   05/18/2017 at 0800  . diltiazem (CARDIZEM CD) 120 MG 24 hr capsule Take 300 mg by mouth daily.    05/23/2017 at 0830  . gabapentin (NEURONTIN) 300 MG capsule Take 300 mg by mouth 2 (two) times daily.   05/23/2017 at 0830  . lisinopril (PRINIVIL,ZESTRIL) 20 MG tablet Take 20 mg by mouth daily.   05/23/2017 at 0830  . montelukast (SINGULAIR) 10 MG tablet Take 10 mg by mouth at bedtime.   05/23/2017 at 0830  . [DISCONTINUED] aspirin EC 81 MG tablet Take 81 mg by mouth daily.     [name] albuterol (PROVENTIL HFA;VENTOLIN HFA) 108 (90 Base) MCG/ACT inhaler Inhale 2 puffs into the lungs every 6 (six) hours as needed for wheezing or shortness of breath.     . fluticasone furoate-vilanterol (BREO ELLIPTA) 200-25 MCG/INH AEPB Inhale 1 puff into the lungs daily.     . [DISCONTINUED] benzonatate (TESSALON) 100 MG capsule Take 1 capsule (100 mg total) by mouth 3 (three) times daily as needed for cough.  21 capsule 0      No Known Allergies   Past Medical History:  Diagnosis Date  . Asthma   . Atrial fibrillation ([place])   . Depression   . Dysrhythmia    Atrial Fibrillation  . Hypertension   . Hypothyroidism     Review of systems:      Physical Exam    Heart and lungs: Regular rate and rhythm without rub or gallop, lungs are bilaterally clear    HEENT: Normocephalic atraumatic eyes are anicteric    Other:     Pertinant exam for procedure: Soft, tender to palpation in the epigastrium as well as the medial right upper quadrant. There are no masses or rebound. Bowel sounds are positive normoactive.    Planned proceedures: EGD, colonoscopy and indicated procedures. I have discussed the risks benefits and complications of procedures to include not limited to bleeding, infection, perforation and the risk of sedation and the patient wishes to proceed.  From patient's description of symptoms and physical examination today also feel that there is relatively a higher risk issue with the gallbladder problems. We will also arrange for her to have an ultrasound with HIDA/CCK study to rule out functional and/or stone disease.    Mary Sails, MD Gastroenterology 05/23/2017  10:18 AM

## 2017-05-23 NOTE — Transfer of Care (Signed)
Immediate Anesthesia Transfer of Care Note  Patient: Mary Irwin  Procedure(s) Performed: Procedure(s): COLONOSCOPY WITH PROPOFOL (N/A) ESOPHAGOGASTRODUODENOSCOPY (EGD) WITH PROPOFOL (N/A)  Patient Location: PACU  Anesthesia Type:General  Level of Consciousness: awake  Airway & Oxygen Therapy: Patient Spontanous Breathing and Patient connected to nasal cannula oxygen  Post-op Assessment: Report given to RN and Post -op Vital signs reviewed and stable  Post vital signs: Reviewed  Last Vitals:  Vitals:   05/23/17 1014  BP: (!) 140/50  Pulse: 90  Resp: 18  Temp: 37.2 C    Last Pain:  Vitals:   05/23/17 1014  TempSrc: Tympanic         Complications: No apparent anesthesia complications

## 2017-05-23 NOTE — Op Note (Signed)
[facility] Gastroenterology Patient Name: Mary Irwin Procedure Date: 05/23/2017 10:22 AM MRN: [id number] Account #: [id number] Date of Birth: 04-27-1951 Admit Type: Outpatient Age: 66 Room: [facility] ENDO ROOM 3 Gender: Female Note Status: Finalized Procedure:            Upper GI endoscopy Indications:          Epigastric abdominal pain, Abdominal pain in the right                        upper quadrant, Gastro-esophageal reflux disease Providers:            Lollie Sails, MD Referring MD:         Nani Ravens (Referring MD) Medicines:            Monitored Anesthesia Care Complications:        No immediate complications. Procedure:            Pre-Anesthesia Assessment:                       - ASA Grade Assessment: III - A patient with severe                        systemic disease.                       After obtaining informed consent, the endoscope was                        passed under direct vision. Throughout the procedure,                        the patient's blood pressure, pulse, and oxygen                        saturations were monitored continuously. The procedure                        was aborted. The scope was not inserted. Medications                        were given. The upper GI endoscopy was unusually                        difficult due to patient intolerance of esophageal                        intubation, with oxygen desaturation and mild                        laryngospasm. Findings:      The scope was placed into the hypopharynx above the esophagus and       patient had repeated laryngospasm and desaturation. Procedure aborted. Impression:           - No specimens collected. Recommendation:       - Discharge patient to home.                       - Do an upper GI series at appointment to be scheduled. Diagnosis Code(s):    --- Professional ---  R10.13, Epigastric pain                       R10.11, Right upper  quadrant pain                       K21.9, Gastro-esophageal reflux disease without                        esophagitis Lollie Sails, MD 05/23/2017 11:39:41 AM This report has been signed electronically. Number of Addenda: 0 Note Initiated On: 05/23/2017 10:22 AM      [facility]

## 2017-05-23 NOTE — Op Note (Signed)
[facility] Gastroenterology Patient Name: Mary Irwin Procedure Date: 05/23/2017 10:21 AM MRN: [id number] Account #: [id number] Date of Birth: 21-Jul-1951 Admit Type: Outpatient Age: 66 Room: [facility] ENDO ROOM 3 Gender: Female Note Status: Finalized Procedure:            Colonoscopy Indications:          Screening for colorectal malignant neoplasm Providers:            Lollie Sails, MD Referring MD:         Nani Ravens (Referring MD) Medicines:            Monitored Anesthesia Care Complications:        No immediate complications. Procedure:            Pre-Anesthesia Assessment:                       - ASA Grade Assessment: III - A patient with severe                        systemic disease.                       After obtaining informed consent, the colonoscope was                        passed under direct vision. Throughout the procedure,                        the patient's blood pressure, pulse, and oxygen                        saturations were monitored continuously. The                        Colonoscope was introduced through the anus and                        advanced to the the cecum, identified by appendiceal                        orifice and ileocecal valve. The colonoscopy was                        performed with moderate difficulty due to a tortuous                        colon. Successful completion of the procedure was aided                        by changing the patient to a supine position, changing                        the patient to a prone position and using manual                        pressure. The patient tolerated the procedure well. The                        quality of the bowel preparation was good. Findings:      Multiple medium-mouthed diverticula were found  in the sigmoid colon and       distal descending colon.      The sigmoid colon and transverse colon were significantly redundant.      The digital rectal exam was  normal otherwise.      Non-bleeding external hemorrhoids were found during perianal exam. The       hemorrhoids were medium-sized. Impression:           - Diverticulosis in the sigmoid colon and in the distal                        descending colon.                       - Redundant colon.                       - No specimens collected. Recommendation:       - Discharge patient to home.                       - Use Analpram HC Cream 2.5%: Apply externally TID for                        10 days. Procedure Code(s):    --- Professional ---                       [phone], Colonoscopy, flexible; diagnostic, including                        collection of specimen(s) by brushing or washing, when                        performed (separate procedure) Diagnosis Code(s):    --- Professional ---                       Z12.11, Encounter for screening for malignant neoplasm                        of colon                       K57.30, Diverticulosis of large intestine without                        perforation or abscess without bleeding                       Q43.8, Other specified congenital malformations of                        intestine CPT copyright 2016 American Medical Association. All rights reserved. The codes documented in this report are preliminary and upon coder review may  be revised to meet current compliance requirements. Lollie Sails, MD 05/23/2017 11:34:14 AM This report has been signed electronically. Number of Addenda: 0 Note Initiated On: 05/23/2017 10:21 AM Scope Withdrawal Time: 0 hours 6 minutes 36 seconds  Total Procedure Duration: 0 hours 22 minutes 59 seconds       [facility]

## 2017-05-24 ENCOUNTER — Encounter: Payer: Self-pay | Admitting: Gastroenterology

## 2017-05-25 ENCOUNTER — Other Ambulatory Visit: Payer: Self-pay | Admitting: Gastroenterology

## 2017-05-25 DIAGNOSIS — R1011 Right upper quadrant pain: Secondary | ICD-10-CM

## 2017-05-25 DIAGNOSIS — R1013 Epigastric pain: Secondary | ICD-10-CM

## 2017-05-30 ENCOUNTER — Ambulatory Visit
Admission: RE | Admit: 2017-05-30 | Discharge: 2017-05-30 | Disposition: A | Discharge status: Home / Self Care | Payer: Medicare Other | Source: Ambulatory Visit | Attending: Orthopedic Surgery | Admitting: Orthopedic Surgery

## 2017-05-30 DIAGNOSIS — M4807 Spinal stenosis, lumbosacral region: Secondary | ICD-10-CM | POA: Insufficient documentation

## 2017-05-30 DIAGNOSIS — M5136 Other intervertebral disc degeneration, lumbar region: Secondary | ICD-10-CM | POA: Diagnosis not present

## 2017-05-30 DIAGNOSIS — Z9889 Other specified postprocedural states: Secondary | ICD-10-CM | POA: Diagnosis not present

## 2017-05-30 DIAGNOSIS — M5137 Other intervertebral disc degeneration, lumbosacral region: Secondary | ICD-10-CM | POA: Insufficient documentation

## 2017-05-30 DIAGNOSIS — M5416 Radiculopathy, lumbar region: Secondary | ICD-10-CM | POA: Diagnosis present

## 2017-06-01 ENCOUNTER — Ambulatory Visit
Admission: RE | Admit: 2017-06-01 | Discharge: 2017-06-01 | Disposition: A | Discharge status: Home / Self Care | Payer: Medicare Other | Source: Ambulatory Visit | Attending: Gastroenterology | Admitting: Gastroenterology

## 2017-06-01 ENCOUNTER — Observation Stay
Admission: AD | Admit: 2017-06-01 | Discharge: 2017-06-02 | Disposition: A | Discharge status: Home / Self Care | Payer: Medicare Other | Source: Ambulatory Visit | Attending: Internal Medicine | Admitting: Internal Medicine

## 2017-06-01 DIAGNOSIS — R1011 Right upper quadrant pain: Secondary | ICD-10-CM

## 2017-06-01 DIAGNOSIS — Z79899 Other long term (current) drug therapy: Secondary | ICD-10-CM | POA: Insufficient documentation

## 2017-06-01 DIAGNOSIS — D62 Acute posthemorrhagic anemia: Secondary | ICD-10-CM | POA: Diagnosis not present

## 2017-06-01 DIAGNOSIS — R1013 Epigastric pain: Secondary | ICD-10-CM

## 2017-06-01 DIAGNOSIS — I10 Essential (primary) hypertension: Secondary | ICD-10-CM | POA: Insufficient documentation

## 2017-06-01 DIAGNOSIS — F329 Major depressive disorder, single episode, unspecified: Secondary | ICD-10-CM | POA: Diagnosis not present

## 2017-06-01 DIAGNOSIS — E039 Hypothyroidism, unspecified: Secondary | ICD-10-CM | POA: Insufficient documentation

## 2017-06-01 DIAGNOSIS — J45909 Unspecified asthma, uncomplicated: Secondary | ICD-10-CM | POA: Diagnosis not present

## 2017-06-01 DIAGNOSIS — D509 Iron deficiency anemia, unspecified: Secondary | ICD-10-CM | POA: Diagnosis present

## 2017-06-01 DIAGNOSIS — Z96653 Presence of artificial knee joint, bilateral: Secondary | ICD-10-CM | POA: Diagnosis not present

## 2017-06-01 DIAGNOSIS — D649 Anemia, unspecified: Secondary | ICD-10-CM | POA: Diagnosis present

## 2017-06-01 DIAGNOSIS — I4891 Unspecified atrial fibrillation: Secondary | ICD-10-CM | POA: Insufficient documentation

## 2017-06-01 DIAGNOSIS — K219 Gastro-esophageal reflux disease without esophagitis: Secondary | ICD-10-CM | POA: Diagnosis not present

## 2017-06-01 DIAGNOSIS — Z7901 Long term (current) use of anticoagulants: Secondary | ICD-10-CM | POA: Diagnosis not present

## 2017-06-01 LAB — BASIC METABOLIC PANEL
Anion gap: 9 (ref 5–15)
BUN: 11 mg/dL (ref 6–20)
CALCIUM: 9.6 mg/dL (ref 8.9–10.3)
CHLORIDE: 113 mmol/L — AB (ref 101–111)
CO2: 22 mmol/L (ref 22–32)
CREATININE: 0.73 mg/dL (ref 0.44–1.00)
Glucose, Bld: 92 mg/dL (ref 65–99)
Potassium: 3.4 mmol/L — ABNORMAL LOW (ref 3.5–5.1)
SODIUM: 144 mmol/L (ref 135–145)

## 2017-06-01 LAB — URINALYSIS, COMPLETE (UACMP) WITH MICROSCOPIC
BILIRUBIN URINE: NEGATIVE
Bacteria, UA: NONE SEEN
GLUCOSE, UA: NEGATIVE mg/dL
Hgb urine dipstick: NEGATIVE
Ketones, ur: NEGATIVE mg/dL
Nitrite: NEGATIVE
PH: 7 (ref 5.0–8.0)
Protein, ur: NEGATIVE mg/dL
SPECIFIC GRAVITY, URINE: 1.017 (ref 1.005–1.030)

## 2017-06-01 LAB — ABO/RH: ABO/RH(D): A POS

## 2017-06-01 LAB — CBC
HEMATOCRIT: 18.8 % — AB (ref 35.0–47.0)
HEMOGLOBIN: 5.5 g/dL — AB (ref 12.0–16.0)
MCH: 17.7 pg — ABNORMAL LOW (ref 26.0–34.0)
MCHC: 29.2 g/dL — AB (ref 32.0–36.0)
MCV: 60.7 fL — ABNORMAL LOW (ref 80.0–100.0)
Platelets: 546 10*3/uL — ABNORMAL HIGH (ref 150–440)
RBC: 3.11 MIL/uL — AB (ref 3.80–5.20)
RDW: 18.5 % — ABNORMAL HIGH (ref 11.5–14.5)
WBC: 7.9 10*3/uL (ref 3.6–11.0)

## 2017-06-01 LAB — IRON AND TIBC
IRON: 11 ug/dL — AB (ref 28–170)
Saturation Ratios: 2 % — ABNORMAL LOW (ref 10.4–31.8)
TIBC: 594 ug/dL — AB (ref 250–450)
UIBC: 583 ug/dL

## 2017-06-01 LAB — PROTIME-INR
INR: 1.22
PROTHROMBIN TIME: 15.5 s — AB (ref 11.4–15.2)

## 2017-06-01 LAB — APTT: APTT: 31 s (ref 24–36)

## 2017-06-01 LAB — FOLATE: FOLATE: 14.8 ng/mL (ref >5.9)

## 2017-06-01 LAB — FERRITIN: FERRITIN: 4 ng/mL — AB (ref 11–307)

## 2017-06-01 LAB — PREPARE RBC (CROSSMATCH)

## 2017-06-01 MED ORDER — MONTELUKAST SODIUM 10 MG PO TABS
10.0000 mg | ORAL_TABLET | Freq: Every day | ORAL | Status: DC
  Administered 2017-06-01: 10 mg via ORAL
  Filled 2017-06-01: qty 1

## 2017-06-01 MED ORDER — SODIUM CHLORIDE 0.9 % IV SOLN
Freq: Once | INTRAVENOUS | Status: AC
  Administered 2017-06-01: 20:00:00 via INTRAVENOUS

## 2017-06-01 MED ORDER — DILTIAZEM HCL ER COATED BEADS 300 MG PO CP24
300.0000 mg | ORAL_CAPSULE | Freq: Every day | ORAL | Status: DC
  Administered 2017-06-02: 300 mg via ORAL
  Filled 2017-06-01 (×2): qty 1

## 2017-06-01 MED ORDER — ONDANSETRON HCL 4 MG PO TABS: 4.0000 mg | ORAL_TABLET | Freq: Four times a day (QID) | ORAL | Status: DC | PRN

## 2017-06-01 MED ORDER — ALBUTEROL SULFATE (2.5 MG/3ML) 0.083% IN NEBU: 2.5000 mg | INHALATION_SOLUTION | Freq: Four times a day (QID) | RESPIRATORY_TRACT | Status: DC | PRN

## 2017-06-01 MED ORDER — ONDANSETRON HCL 4 MG/2ML IJ SOLN: 4.0000 mg | Freq: Four times a day (QID) | INTRAMUSCULAR | Status: DC | PRN

## 2017-06-01 MED ORDER — PANTOPRAZOLE SODIUM 40 MG IV SOLR
40.0000 mg | Freq: Two times a day (BID) | INTRAVENOUS | Status: DC
  Administered 2017-06-01 – 2017-06-02 (×2): 40 mg via INTRAVENOUS
  Filled 2017-06-01 (×2): qty 40

## 2017-06-01 MED ORDER — FLUTICASONE FUROATE-VILANTEROL 200-25 MCG/INH IN AEPB
1.0000 | INHALATION_SPRAY | Freq: Every day | RESPIRATORY_TRACT | Status: DC
  Administered 2017-06-02: 11:00:00 1 via RESPIRATORY_TRACT
  Filled 2017-06-01: qty 28

## 2017-06-01 MED ORDER — LISINOPRIL 20 MG PO TABS
20.0000 mg | ORAL_TABLET | Freq: Every day | ORAL | Status: DC
  Administered 2017-06-02: 20 mg via ORAL
  Filled 2017-06-01 (×2): qty 1

## 2017-06-01 MED ORDER — ALBUTEROL SULFATE HFA 108 (90 BASE) MCG/ACT IN AERS: 2.0000 | INHALATION_SPRAY | Freq: Four times a day (QID) | RESPIRATORY_TRACT | Status: DC | PRN

## 2017-06-01 MED ORDER — ACETAMINOPHEN 650 MG RE SUPP: 650.0000 mg | Freq: Four times a day (QID) | RECTAL | Status: DC | PRN

## 2017-06-01 MED ORDER — GABAPENTIN 300 MG PO CAPS
300.0000 mg | ORAL_CAPSULE | Freq: Two times a day (BID) | ORAL | Status: DC
  Administered 2017-06-01 – 2017-06-02 (×2): 300 mg via ORAL
  Filled 2017-06-01 (×2): qty 1

## 2017-06-01 MED ORDER — ACETAMINOPHEN 325 MG PO TABS: 650.0000 mg | ORAL_TABLET | Freq: Four times a day (QID) | ORAL | Status: DC | PRN

## 2017-06-01 NOTE — H&P (Signed)
Mary Irwin is an 66 y.o. female.   Chief Complaint: Anemia HPI: This is 66 year old female has a history of asthma. She was at a routine follow-up appointment with Dr. Gust Brooms office today. She told him about some low-grade fever she had had. As far as workup he ordered a CBC and found she had a hemoglobin of 5. It was repeated and was the same. I do not have access to those labs. She was referred over to the hospitalist service for direct admit. Upon arrival of floor she said that this was a surprise to her and that she's been asymptomatic. No dizziness or weakness. She has a colonoscopy last week by Dr. Anastasio Champion which did not show any evidence of GI bleeding. The upper endoscopy had to be aborted because of laryngeal spasm. Since then she's had an ultrasound showed some gallstones and had an upper GI which showed some reflux. She is currently on Eliquis but is not been taking aspirin since starting that. Denies any NSAID use.  Past Medical History:  Diagnosis Date  . Asthma   . Atrial fibrillation ([place])   . Depression   . Dysrhythmia    Atrial Fibrillation  . Hypertension   . Hypothyroidism     Past Surgical History:  Procedure Laterality Date  . BACK SURGERY    . COLONOSCOPY WITH PROPOFOL N/A 05/23/2017   Procedure: COLONOSCOPY WITH PROPOFOL;  Surgeon: Lollie Sails, MD;  Location[facility] ENDOSCOPY;  Service: Endoscopy;  Laterality: N/A;  . ESOPHAGOGASTRODUODENOSCOPY (EGD) WITH PROPOFOL N/A 05/23/2017   Procedure: ESOPHAGOGASTRODUODENOSCOPY (EGD) WITH PROPOFOL;  Surgeon: Lollie Sails, MD;  Location: [facility] ENDOSCOPY;  Service: Endoscopy;  Laterality: N/A;  . JOINT REPLACEMENT Bilateral    Total Knee Replacement    Family History  Problem Relation Age of Onset  . Breast cancer Maternal Aunt 93  . Hypertension Mother    Social History:  reports that she has never smoked. She has never used smokeless tobacco. She reports that she does not drink alcohol or use  drugs.  Allergies: No Known Allergies  Medications Prior to Admission  Medication Sig Dispense Refill  . albuterol (PROVENTIL HFA;VENTOLIN HFA) 108 (90 Base) MCG/ACT inhaler Inhale 2 puffs into the lungs every 6 (six) hours as needed for wheezing or shortness of breath.    [name] apixaban (ELIQUIS) 5 MG TABS tablet Take 5 mg by mouth 2 (two) times daily.    [name] diltiazem (CARDIZEM CD) 120 MG 24 hr capsule Take 300 mg by mouth daily.     . fluticasone furoate-vilanterol (BREO ELLIPTA) 200-25 MCG/INH AEPB Inhale 1 puff into the lungs daily.    [name] gabapentin (NEURONTIN) 300 MG capsule Take 300 mg by mouth 2 (two) times daily.    [name] lisinopril (PRINIVIL,ZESTRIL) 20 MG tablet Take 20 mg by mouth daily.    . montelukast (SINGULAIR) 10 MG tablet Take 10 mg by mouth at bedtime.      No results found for this or any previous visit (from the past 48 hour(s)). US Abdomen Complete  Result Date: 06/01/2017 CLINICAL DATA:  Epigastric abdominal pain. EXAM: ABDOMEN ULTRASOUND COMPLETE COMPARISON:  None. FINDINGS: Gallbladder: Multiple gallstones which are mobile. No sonographic Murphy's sign. Gallbladder wall thickness normal. Largest single dimension of a gallstone measures 1.3 cm. Common bile duct: Diameter: Normal measuring 5.2 mm Liver: Increased echogenicity, possible steatosis. IVC: No abnormality visualized. Pancreas: Visualized portion unremarkable. Spleen: Mild splenomegaly, 12.6 cm. Right Kidney: Length: 10.5 cm. Echogenicity within normal limits. No mass or  hydronephrosis visualized. Left Kidney: Length: 11.1 cm. Echogenicity within normal limits. No mass or hydronephrosis visualized. Abdominal aorta: No aneurysm visualized. Other findings: None. IMPRESSION: Cholelithiasis without signs of acute cholecystitis or biliary ductal dilatation. Suspected hepatic steatosis. Electronically Signed   By: Elsie Stain M.D.   On: 06/01/2017 08:35   Dg Ugi W/high Density W/kub  Result Date: 06/01/2017 CLINICAL DATA:   Right upper quadrant pain and discomfort with burning sensation in the epigastrium as well as lower abdomen. Chronic diarrhea, increasing shortness of breath. History of asthma, atrial fibrillation. Incomplete endoscopy 1 week ago. EXAM: UPPER GI SERIES WITH KUB TECHNIQUE: After obtaining a scout radiograph a routine upper GI series was performed using thin and high density barium. Effervescent crystals and a barium tablet were administered. FLUOROSCOPY TIME:  Fluoroscopy Time:  1 minutes, 12 seconds Radiation Exposure Index (if provided by the fluoroscopic device): 1346 micro Gy per meters square Number of Acquired Spot Images: 13 ARDS KUB. COMPARISON:  None in PACs FINDINGS: The patient was suffering from diarrhea at the time of the examination and the study was completed as promptly as possible. The scout image revealed a normal bowel gas pattern. No abnormal soft tissue calcifications are observed. 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 The patient ingested thick and thin barium and gas forming crystals with some difficulty. The volume of barium administered was adequate for the study. The thoracic esophagus demonstrated normal distensibility. No stricture was observed. Reflux was observed a spontaneously. There was a tiny reducible hiatal hernia. The stomach distended well. The mucosal fold pattern was normal. Gastric emptying was prompt. The duodenal bulb and Celsius sweep were normal. There was a diverticulum noted in the second portion of the duodenum. In the prone position esophageal motility appeared normal. The barium tablet passed promptly from the mouth to the stomach. IMPRESSION: Small amount of gastroesophageal reflex. Tiny reducible hiatal hernia. No evidence of esophageal stricture or esophagitis. Normal appearance of the stomach and duodenum. Electronically Signed   By: David  [place] M.D.   On: 06/01/2017 09:01    Review of Systems  Constitutional: Negative for chills and fever.  HENT: Negative  for hearing loss.   Eyes: Negative for blurred vision.  Respiratory: Negative for cough.   Cardiovascular: Negative for chest pain.  Gastrointestinal: Positive for nausea. Negative for vomiting.  Genitourinary: Negative for dysuria.  Musculoskeletal: Negative for joint pain.  Skin: Negative for rash.  Neurological: Negative for dizziness.    Blood pressure (!) 150/58, pulse 84, temperature 99 F (37.2 C), temperature source Oral, resp. rate 17, SpO2 100 %. Physical Exam  Constitutional: She is oriented to person, place, and time.  Pale looking female in no acute distress  HENT:  Head: Normocephalic and atraumatic.  Mouth/Throat: Oropharynx is clear and moist. No oropharyngeal exudate.  Eyes: Pupils are equal, round, and reactive to light. Conjunctivae are normal. No scleral icterus.  Neck: Neck supple. No JVD present. No tracheal deviation present. No thyromegaly present.  Cardiovascular: Normal rate.   Murmur heard. Respiratory: Breath sounds normal. No respiratory distress. She has no wheezes. She has no rales. She exhibits no tenderness.  GI: Soft. Bowel sounds are normal. She exhibits no distension and no mass. There is no tenderness. There is no rebound and no guarding.  Musculoskeletal: Normal range of motion. She exhibits no edema.  Lymphadenopathy:    She has no cervical adenopathy.  Neurological: She is alert and oriented to person, place, and time. No cranial nerve deficit. Coordination normal.  Skin:  Skin is warm and dry.     Assessment/Plan 1. Anemia. Etiology unknown at this point. However with being on Eliquis for atrial fibrillation GI bleeding has to be a top consideration. Doubt lower GI since she had a colonoscopy last week showed no evidence of bleeding. Upper endoscopy had to be aborted. For now we'll go ahead and transfuse HER-2 units of packed red blood cells. Hold her Eliquis. Guaiac her stools. Consult GI to further evaluate for upper endoscopy bleeding. We'll  also get iron studies, B12 and folate. 2. Atrial fibrillation. Rate is stable. Holding Eliquis for the above. 3. Hypertension. Appears to be controlled. 4. Hypothyroidism. Continue current medications. 5. History of low-grade fevers. Currently afebrile. We'll go and check urinalysis. She had chest x-ray at Dr. Gust Brooms office but did not have access to those results this evening. Follow up on those tomorrow.  Total time spent 45 minutes  Baxter Hire, MD 06/01/2017, 6:53 PM

## 2017-06-02 DIAGNOSIS — D62 Acute posthemorrhagic anemia: Secondary | ICD-10-CM | POA: Diagnosis not present

## 2017-06-02 LAB — CBC
HCT: 26.4 % — ABNORMAL LOW (ref 35.0–47.0)
Hemoglobin: 8.5 g/dL — ABNORMAL LOW (ref 12.0–16.0)
MCH: 22.2 pg — AB (ref 26.0–34.0)
MCHC: 32.1 g/dL (ref 32.0–36.0)
MCV: 69 fL — AB (ref 80.0–100.0)
PLATELETS: 455 10*3/uL — AB (ref 150–440)
RBC: 3.83 MIL/uL (ref 3.80–5.20)
RDW: 27.8 % — AB (ref 11.5–14.5)
WBC: 8.1 10*3/uL (ref 3.6–11.0)

## 2017-06-02 MED ORDER — SODIUM CHLORIDE 0.9 % IV SOLN
510.0000 mg | Freq: Once | INTRAVENOUS | Status: AC
  Administered 2017-06-02: 510 mg via INTRAVENOUS
  Filled 2017-06-02 (×2): qty 17

## 2017-06-02 MED ORDER — GUAIFENESIN-DM 100-10 MG/5ML PO SYRP
5.0000 mL | ORAL_SOLUTION | ORAL | Status: DC | PRN
  Administered 2017-06-02: 5 mL via ORAL
  Filled 2017-06-02: qty 5

## 2017-06-02 MED ORDER — BOOST / RESOURCE BREEZE PO LIQD
1.0000 | Freq: Three times a day (TID) | ORAL | Status: DC
  Administered 2017-06-02: 1 via ORAL

## 2017-06-02 NOTE — Care Management Obs Status (Signed)
[facility] NOTIFICATION   Patient Details  Name: FARZANA KOCI MRN: [id number] Date of Birth: Apr 26, 1951   Medicare Observation Status Notification Given:  No (admitted obs less than 24 hours)    Beverly Sessions, RN 06/02/2017, 2:37 PM

## 2017-06-02 NOTE — Progress Notes (Signed)
Initial Nutrition Assessment  DOCUMENTATION CODES:   Obesity unspecified  INTERVENTION:   Boost Breeze po TID, each supplement provides 250 kcal and 9 grams of protein  Snacks  NUTRITION DIAGNOSIS:   Increased nutrient needs related to acute illness (anemia ), possible GIB as evidenced by increased estimated needs from protein.  GOAL:   Patient will meet greater than or equal to 90% of their needs  MONITOR:   PO intake, Supplement acceptance, Labs, Weight trends  REASON FOR ASSESSMENT:   Malnutrition Screening Tool    ASSESSMENT:   66 y/o female with diverticulosis and gallstones admitted with anemia s/p colonoscopy 7/17    Met with pt in room today. Pt reports poor appetite and oral intake since 7/17 after her colonoscopy. Pt reports that her appetite has not returned since having this procedure. Pt was noted to have diverticulosis at this time. An EGD was scheduled to be performed on the same day but pt was not able to tolerate it due to laryngeal spasms. Per chart, pt has lost 11lbs(5%) in 3 months; this is not significant. Pt reports that her appetite is improved today and she is eating 100% of her meals currently. Pt does not like milky supplements; RD will order Boost Breeze and snacks. Pt with hypokalemia; monitor and supplement as needed per MD discretion.    Medications reviewed and include: protonix  Labs reviewed: K 3.4(L) Hgb 8.5(L), Hct 26.4(L)  Nutrition-Focused physical exam completed. Findings are no fat depletion, no muscle depletion, and moderate edema in BLE.   Diet Order:  Diet regular Room service appropriate? Yes; Fluid consistency: Thin  Skin:  Reviewed, no issues  Last BM:  none since admit   Height:   Ht Readings from Last 1 Encounters:  06/02/17 '5\' 4"'$  (1.626 m)    Weight:   Wt Readings from Last 1 Encounters:  06/02/17 189 lb 8 oz (86 kg)    Ideal Body Weight:  54.5 kg  BMI:  Body mass index is 32.53 kg/m.  Estimated  Nutritional Needs:   Kcal:  1600-1900kcal/day   Protein:  86-95g/day   Fluid:  >1.6L/day   EDUCATION NEEDS:   Education needs addressed  Koleen Distance MS, RD, LDN Pager #[phone] After Hours Pager: [phone]

## 2017-06-02 NOTE — Discharge Instructions (Addendum)
Stop taking Eliquis till you follow up with Dr. Gustavo Lah.  Call your doctor or return to ED if you see blood in stool, black stool, feel lightheaded, palpitations.  You will need blood work to check your Hemoglobin level with your doctor in 1 week

## 2017-06-02 NOTE — Progress Notes (Signed)
06/02/2017  4:47 PM  [name] to be D/C'd Home per MD order.  Discussed prescriptions and follow up appointments with the patient. Prescriptions given to patient, medication list explained in detail. Pt verbalized understanding.  Allergies as of 06/02/2017   No Known Allergies     Medication List    STOP taking these medications   ELIQUIS 5 MG Tabs tablet Generic drug:  apixaban     TAKE these medications   albuterol 108 (90 Base) MCG/ACT inhaler Commonly known as:  PROVENTIL HFA;VENTOLIN HFA Inhale 2 puffs into the lungs every 6 (six) hours as needed for wheezing or shortness of breath.   BREO ELLIPTA 200-25 MCG/INH Aepb Generic drug:  fluticasone furoate-vilanterol Inhale 1 puff into the lungs daily.   diltiazem 120 MG 24 hr capsule Commonly known as:  CARDIZEM CD Take 300 mg by mouth daily.   gabapentin 300 MG capsule Commonly known as:  NEURONTIN Take 300 mg by mouth 2 (two) times daily.   lisinopril 20 MG tablet Commonly known as:  PRINIVIL,ZESTRIL Take 20 mg by mouth daily.   montelukast 10 MG tablet Commonly known as:  SINGULAIR Take 10 mg by mouth at bedtime.       Vitals:   06/02/17 0625 06/02/17 1333  BP: (!) 127/59 (!) 130/47  Pulse: 74 74  Resp: 15 18  Temp: 98.6 F (37 C) 98.7 F (37.1 C)    Skin clean, dry and intact without evidence of skin break down, no evidence of skin tears noted. IV catheter discontinued intact. Site without signs and symptoms of complications. Dressing and pressure applied. Pt denies pain at this time. No complaints noted.  An After Visit Summary was printed and given to the patient. Patient escorted via [place], and D/C home via private auto.  Mary Irwin

## 2017-06-03 LAB — TYPE AND SCREEN
ABO/RH(D): A POS
ANTIBODY SCREEN: NEGATIVE
UNIT DIVISION: 0
Unit division: 0

## 2017-06-03 LAB — BPAM RBC
BLOOD PRODUCT EXPIRATION DATE: 201808112359
Blood Product Expiration Date: 201808142359
ISSUE DATE / TIME: 201807262242
ISSUE DATE / TIME: 201807270253
UNIT TYPE AND RH: 6200
Unit Type and Rh: 6200

## 2017-06-05 NOTE — Discharge Summary (Signed)
[facility] NAME: Mary Irwin    MR#:  [id number]  DATE OF BIRTH:  1950-12-17  DATE OF ADMISSION:  06/01/2017 ADMITTING PHYSICIAN: Theodoro Grist, MD  DATE OF DISCHARGE: 06/02/2017  5:00 PM  PRIMARY CARE PHYSICIAN: Nani Ravens, PA-C   ADMISSION DIAGNOSIS:  low hemaglobin 5.4   DISCHARGE DIAGNOSIS:  Active Problems:   Anemia   SECONDARY DIAGNOSIS:   Past Medical History:  Diagnosis Date  . Asthma   . Atrial fibrillation ([place])   . Depression   . Dysrhythmia    Atrial Fibrillation  . Hypertension   . Hypothyroidism      ADMITTING HISTORY  Chief Complaint: Anemia HPI: This is 66 year old female has a history of asthma. She was at a routine follow-up appointment with Dr. Gust Brooms office today. She told him about some low-grade fever she had had. As far as workup he ordered a CBC and found she had a hemoglobin of 5. It was repeated and was the same. I do not have access to those labs. She was referred over to the hospitalist service for direct admit. Upon arrival of floor she said that this was a surprise to her and that she's been asymptomatic. No dizziness or weakness. She has a colonoscopy last week by Dr. Anastasio Champion which did not show any evidence of GI bleeding. The upper endoscopy had to be aborted because of laryngeal spasm. Since then she's had an ultrasound showed some gallstones and had an upper GI which showed some reflux. She is currently on Eliquis but is not been taking aspirin since starting that. Denies any NSAID use.  HOSPITAL COURSE:   * Anemia with possible GI bleed. Stool did not show any blood but no other source found. Transfuse 2 units packed RBC and hemoglobin increased from 5.5-8.5. Had recent colonoscopy which was normal. EGD attempted but significant spasms and scope couldn't pass through. At this time patient's Eliquis is being stopped. Iron infusion given. Follow-up with GI as outpatient. No acute  bleeding. Patient is stable for discharge. Advised to return if she sees any black stools or blood in stool. Needs a hemoglobin check as outpatient within a week.  CONSULTS OBTAINED:    DRUG ALLERGIES:  No Known Allergies  DISCHARGE MEDICATIONS:   Discharge Medication List as of 06/02/2017  2:38 PM    CONTINUE these medications which have NOT CHANGED   Details  albuterol (PROVENTIL HFA;VENTOLIN HFA) 108 (90 Base) MCG/ACT inhaler Inhale 2 puffs into the lungs every 6 (six) hours as needed for wheezing or shortness of breath., Historical Med    diltiazem (CARDIZEM CD) 120 MG 24 hr capsule Take 300 mg by mouth daily. , Historical Med    fluticasone furoate-vilanterol (BREO ELLIPTA) 200-25 MCG/INH AEPB Inhale 1 puff into the lungs daily., Historical Med    gabapentin (NEURONTIN) 300 MG capsule Take 300 mg by mouth 2 (two) times daily., Historical Med    lisinopril (PRINIVIL,ZESTRIL) 20 MG tablet Take 20 mg by mouth daily., Historical Med    montelukast (SINGULAIR) 10 MG tablet Take 10 mg by mouth at bedtime., Historical Med      STOP taking these medications     apixaban (ELIQUIS) 5 MG TABS tablet         Today   VITAL SIGNS:  Blood pressure (!) 130/47, pulse 74, temperature 98.7 F (37.1 C), temperature source Oral, resp. rate 18, height 5\' 4"  (1.626 m), weight 86 kg (189 lb 8  oz), SpO2 100 %.  I/O:  No intake or output data in the 24 hours ending 06/05/17 1508  PHYSICAL EXAMINATION:  Physical Exam  GENERAL:  66 y.o.-year-old patient lying in the bed with no acute distress.  LUNGS: Normal breath sounds bilaterally, no wheezing, rales,rhonchi or crepitation. No use of accessory muscles of respiration.  CARDIOVASCULAR: S1, S2 normal. No murmurs, rubs, or gallops.  ABDOMEN: Soft, non-tender, non-distended. Bowel sounds present. No organomegaly or mass.  NEUROLOGIC: Moves all 4 extremities. PSYCHIATRIC: The patient is alert and oriented x 3.  SKIN: No obvious rash,  lesion, or ulcer.   DATA REVIEW:   CBC  Recent Labs Lab 06/02/17 0708  WBC 8.1  HGB 8.5*  HCT 26.4*  PLT 455*    Chemistries   Recent Labs Lab 06/01/17 1927  NA 144  K 3.4*  CL 113*  CO2 22  GLUCOSE 92  BUN 11  CREATININE 0.73  CALCIUM 9.6    Cardiac Enzymes No results for input(s): TROPONINI in the last 168 hours.  Microbiology Results  No results found for this or any previous visit.  RADIOLOGY:  No results found.  Follow up with PCP in 1 week.  Management plans discussed with the patient, family and they are in agreement.  CODE STATUS:  Code Status History    Date Active Date Inactive Code Status Order ID Comments User Context   06/01/2017  6:54 PM 06/02/2017  8:00 PM Full Code [id number]  Baxter Hire, MD Inpatient      TOTAL TIME TAKING CARE OF THIS PATIENT ON DAY OF DISCHARGE: more than 30 minutes.   Hillary Bow R M.D on 06/05/2017 at 3:08 PM  Between 7am to 6pm - Pager - [phone]  After 6pm go to [email or web address] - password [email or web address] [facility]  Office  [phone]  CC: Primary care physician; Nani Ravens, PA-C  Note: This dictation was prepared with Dragon dictation along with smaller phrase technology. Any transcriptional errors that result from this process are unintentional.

## 2017-06-06 ENCOUNTER — Encounter
Admission: RE | Admit: 2017-06-06 | Discharge: 2017-06-06 | Disposition: A | Discharge status: Home / Self Care | Payer: Medicare Other | Source: Ambulatory Visit | Attending: Gastroenterology | Admitting: Gastroenterology

## 2017-06-06 DIAGNOSIS — R1011 Right upper quadrant pain: Secondary | ICD-10-CM | POA: Insufficient documentation

## 2017-06-06 DIAGNOSIS — R1013 Epigastric pain: Secondary | ICD-10-CM | POA: Insufficient documentation

## 2017-06-06 LAB — METHYLMALONIC ACID, SERUM: METHYLMALONIC ACID, QUANTITATIVE: 115 nmol/L (ref 0–378)

## 2017-06-27 ENCOUNTER — Ambulatory Visit
Admission: RE | Admit: 2017-06-27 | Discharge: 2017-06-27 | Disposition: A | Discharge status: Home / Self Care | Payer: Medicare Other | Source: Ambulatory Visit | Attending: Medical Oncology | Admitting: Medical Oncology

## 2017-06-27 ENCOUNTER — Other Ambulatory Visit: Payer: Self-pay | Admitting: Medical Oncology

## 2017-06-27 DIAGNOSIS — R1030 Lower abdominal pain, unspecified: Secondary | ICD-10-CM | POA: Diagnosis present

## 2017-06-27 DIAGNOSIS — R938 Abnormal findings on diagnostic imaging of other specified body structures: Secondary | ICD-10-CM | POA: Insufficient documentation

## 2017-07-28 ENCOUNTER — Encounter: Payer: Self-pay | Admitting: *Deleted

## 2017-07-31 ENCOUNTER — Ambulatory Visit: Payer: Medicare Other | Admitting: Anesthesiology

## 2017-07-31 ENCOUNTER — Ambulatory Visit
Admission: RE | Admit: 2017-07-31 | Discharge: 2017-07-31 | Disposition: A | Discharge status: Home / Self Care | Payer: Medicare Other | Source: Ambulatory Visit | Attending: Gastroenterology | Admitting: Gastroenterology

## 2017-07-31 ENCOUNTER — Encounter
Admission: RE | Disposition: A | Discharge status: Home / Self Care | Payer: Self-pay | Source: Ambulatory Visit | Attending: Gastroenterology

## 2017-07-31 DIAGNOSIS — M797 Fibromyalgia: Secondary | ICD-10-CM | POA: Diagnosis not present

## 2017-07-31 DIAGNOSIS — I10 Essential (primary) hypertension: Secondary | ICD-10-CM | POA: Insufficient documentation

## 2017-07-31 DIAGNOSIS — M1990 Unspecified osteoarthritis, unspecified site: Secondary | ICD-10-CM | POA: Insufficient documentation

## 2017-07-31 DIAGNOSIS — D509 Iron deficiency anemia, unspecified: Secondary | ICD-10-CM | POA: Diagnosis not present

## 2017-07-31 DIAGNOSIS — I4891 Unspecified atrial fibrillation: Secondary | ICD-10-CM | POA: Insufficient documentation

## 2017-07-31 DIAGNOSIS — K210 Gastro-esophageal reflux disease with esophagitis: Secondary | ICD-10-CM | POA: Insufficient documentation

## 2017-07-31 DIAGNOSIS — Z79899 Other long term (current) drug therapy: Secondary | ICD-10-CM | POA: Insufficient documentation

## 2017-07-31 DIAGNOSIS — E039 Hypothyroidism, unspecified: Secondary | ICD-10-CM | POA: Insufficient documentation

## 2017-07-31 HISTORY — DX: Fibromyalgia: M79.7

## 2017-07-31 HISTORY — DX: Anemia, unspecified: D64.9

## 2017-07-31 HISTORY — DX: Gastro-esophageal reflux disease without esophagitis: K21.9

## 2017-07-31 HISTORY — DX: Unspecified osteoarthritis, unspecified site: M19.90

## 2017-07-31 HISTORY — PX: ESOPHAGOGASTRODUODENOSCOPY (EGD) WITH PROPOFOL: SHX5813

## 2017-07-31 SURGERY — ESOPHAGOGASTRODUODENOSCOPY (EGD) WITH PROPOFOL
Anesthesia: General

## 2017-07-31 MED ORDER — MIDAZOLAM HCL 2 MG/2ML IJ SOLN
INTRAMUSCULAR | Status: DC | PRN
  Administered 2017-07-31: 2 mg via INTRAVENOUS

## 2017-07-31 MED ORDER — SODIUM CHLORIDE 0.9 % IV SOLN: INTRAVENOUS | Status: DC

## 2017-07-31 MED ORDER — FENTANYL CITRATE (PF) 100 MCG/2ML IJ SOLN
INTRAMUSCULAR | Status: AC
  Filled 2017-07-31: qty 2

## 2017-07-31 MED ORDER — FENTANYL CITRATE (PF) 100 MCG/2ML IJ SOLN
INTRAMUSCULAR | Status: DC | PRN
  Administered 2017-07-31: 50 ug via INTRAVENOUS

## 2017-07-31 MED ORDER — PROPOFOL 500 MG/50ML IV EMUL
INTRAVENOUS | Status: AC
  Filled 2017-07-31: qty 50

## 2017-07-31 MED ORDER — GLYCOPYRROLATE 0.2 MG/ML IJ SOLN
INTRAMUSCULAR | Status: DC | PRN
  Administered 2017-07-31: 0.1 mg via INTRAVENOUS

## 2017-07-31 MED ORDER — SODIUM CHLORIDE 0.9 % IV SOLN
INTRAVENOUS | Status: DC
  Administered 2017-07-31: 13:00:00 via INTRAVENOUS

## 2017-07-31 MED ORDER — LIDOCAINE HCL (PF) 2 % IJ SOLN
INTRAMUSCULAR | Status: AC
  Filled 2017-07-31: qty 2

## 2017-07-31 MED ORDER — GLYCOPYRROLATE 0.2 MG/ML IJ SOLN
INTRAMUSCULAR | Status: AC
  Filled 2017-07-31: qty 1

## 2017-07-31 MED ORDER — PROPOFOL 500 MG/50ML IV EMUL
INTRAVENOUS | Status: DC | PRN
  Administered 2017-07-31: 120 ug/kg/min via INTRAVENOUS

## 2017-07-31 MED ORDER — LIDOCAINE HCL (CARDIAC) 20 MG/ML IV SOLN
INTRAVENOUS | Status: DC | PRN
  Administered 2017-07-31: 30 mg via INTRAVENOUS

## 2017-07-31 MED ORDER — MIDAZOLAM HCL 2 MG/2ML IJ SOLN
INTRAMUSCULAR | Status: AC
  Filled 2017-07-31: qty 2

## 2017-07-31 NOTE — Anesthesia Post-op Follow-up Note (Signed)
Anesthesia QCDR form completed.        

## 2017-07-31 NOTE — Anesthesia Preprocedure Evaluation (Signed)
Anesthesia Evaluation  Patient identified by MRN, date of birth, ID band Patient awake    Reviewed: Allergy & Precautions, H&P , NPO status , Patient's Chart, lab work & pertinent test results, reviewed documented beta blocker date and time   Airway Mallampati: II   Neck ROM: full    Dental  (+) Poor Dentition, Teeth Intact   Pulmonary neg pulmonary ROS, asthma ,    Pulmonary exam normal        Cardiovascular hypertension, negative cardio ROS Normal cardiovascular exam+ dysrhythmias Atrial Fibrillation  Rhythm:regular Rate:Normal     Neuro/Psych negative neurological ROS  negative psych ROS   GI/Hepatic negative GI ROS, Neg liver ROS, GERD  Medicated,  Endo/Other  negative endocrine ROSHypothyroidism   Renal/GU negative Renal ROS  negative genitourinary   Musculoskeletal   Abdominal   Peds  Hematology negative hematology ROS (+) anemia ,   Anesthesia Other Findings Past Medical History: No date: Anemia No date: Arthritis No date: Asthma No date: Atrial fibrillation ([facility]) No date: Dysrhythmia     Comment:  Atrial Fibrillation No date: Fibromyalgia No date: GERD (gastroesophageal reflux disease) No date: Hypertension No date: Hypothyroidism Past Surgical History: No date: BACK SURGERY 05/23/2017: COLONOSCOPY WITH PROPOFOL; N/A     Comment:  Procedure: COLONOSCOPY WITH PROPOFOL;  Surgeon:               Lollie Sails, MD;  Location: [facility] ENDOSCOPY;                Service: Endoscopy;  Laterality: N/A; 05/23/2017: ESOPHAGOGASTRODUODENOSCOPY (EGD) WITH PROPOFOL; N/A     Comment:  Procedure: ESOPHAGOGASTRODUODENOSCOPY (EGD) WITH               PROPOFOL;  Surgeon: Lollie Sails, MD;  Location:               [facility] ENDOSCOPY;  Service: Endoscopy;  Laterality: N/A; No date: JOINT REPLACEMENT; Bilateral     Comment:  Total Knee Replacement BMI    Body Mass Index:  31.41 kg/m      Reproductive/Obstetrics negative OB ROS                             Anesthesia Physical Anesthesia Plan  ASA: III  Anesthesia Plan: General   Post-op Pain Management:    Induction:   PONV Risk Score and Plan:   Airway Management Planned:   Additional Equipment:   Intra-op Plan:   Post-operative Plan:   Informed Consent: I have reviewed the patients History and Physical, chart, labs and discussed the procedure including the risks, benefits and alternatives for the proposed anesthesia with the patient or authorized representative who has indicated his/her understanding and acceptance.   Dental Advisory Given  Plan Discussed with: CRNA  Anesthesia Plan Comments:         Anesthesia Quick Evaluation

## 2017-07-31 NOTE — Anesthesia Procedure Notes (Signed)
Performed by: COOK-MARTIN, Corneshia Hines Pre-anesthesia Checklist: Patient identified, Emergency Drugs available, Suction available, Patient being monitored and Timeout performed Patient Re-evaluated:Patient Re-evaluated prior to induction Oxygen Delivery Method: Nasal cannula Preoxygenation: Pre-oxygenation with 100% oxygen Induction Type: IV induction Airway Equipment and Method: Bite block Placement Confirmation: CO2 detector and positive ETCO2       

## 2017-07-31 NOTE — Op Note (Addendum)
[facility] Gastroenterology Patient Name: Mary Irwin Procedure Date: 07/31/2017 12:41 PM MRN: [id number] Account #: [id number] Date of Birth: April 11, 1951 Admit Type: Outpatient Age: 66 Room: [facility] ENDO ROOM 1 Gender: Female Note Status: Finalized Procedure:            Upper GI endoscopy Indications:          Iron deficiency anemia, Gastro-esophageal reflux disease Providers:            Lollie Sails, MD Referring MD:         Nani Ravens (Referring MD) Medicines:            Monitored Anesthesia Care Complications:        No immediate complications. Procedure:            Pre-Anesthesia Assessment:                       - ASA Grade Assessment: III - A patient with severe                        systemic disease.                       After obtaining informed consent, the endoscope was                        passed under direct vision. Throughout the procedure,                        the patient's blood pressure, pulse, and oxygen                        saturations were monitored continuously. The Endoscope                        was introduced through the mouth, and advanced to the                        third part of duodenum. The upper GI endoscopy was                        accomplished without difficulty. The patient tolerated                        the procedure well. Findings:      The examined esophagus was normal.      The Z-line was regular. Biopsies were taken with a cold forceps for       histology.      The entire examined stomach was normal.      The cardia and gastric fundus were normal on retroflexion.      The examined duodenum was normal. One very tiny Possible AVM is noted in       the second portion, however no evidence of bleeding. Impression:           - Normal esophagus.                       - Z-line regular. Biopsied.                       - Normal stomach.                       -  Normal examined duodenum. Recommendation:       -  Discharge patient to home.                       - Perform a small bowel follow through at appointment                        to be scheduled.                       - To visualize the small bowel, perform video capsule                        endoscopy at appointment to be scheduled. Procedure Code(s):    --- Professional ---                       [phone], Esophagogastroduodenoscopy, flexible, transoral;                        with biopsy, single or multiple Diagnosis Code(s):    --- Professional ---                       D50.9, Iron deficiency anemia, unspecified                       K21.9, Gastro-esophageal reflux disease without                        esophagitis CPT copyright 2016 American Medical Association. All rights reserved. The codes documented in this report are preliminary and upon coder review may  be revised to meet current compliance requirements. Lollie Sails, MD 07/31/2017 1:00:10 PM This report has been signed electronically. Number of Addenda: 0 Note Initiated On: 07/31/2017 12:41 PM      [facility]

## 2017-07-31 NOTE — Transfer of Care (Signed)
Immediate Anesthesia Transfer of Care Note  Patient: Mary Irwin  Procedure(s) Performed: Procedure(s): ESOPHAGOGASTRODUODENOSCOPY (EGD) WITH PROPOFOL (N/A)  Patient Location: PACU  Anesthesia Type:General  Level of Consciousness: awake and sedated  Airway & Oxygen Therapy: Patient Spontanous Breathing and Patient connected to nasal cannula oxygen  Post-op Assessment: Report given to RN and Post -op Vital signs reviewed and stable  Post vital signs: Reviewed and stable  Last Vitals:  Vitals:   07/31/17 1224  BP: (!) 141/76  Pulse: 81  Resp: 18  Temp: 37.2 C  SpO2: 100%    Last Pain:  Vitals:   07/31/17 1224  TempSrc: Tympanic         Complications: No apparent anesthesia complications

## 2017-07-31 NOTE — H&P (Signed)
Outpatient short stay form Pre-procedure 07/31/2017 12:37 PM Lollie Sails MD  Primary Physician: Ellison Carwin PA  Reason for visit:  EGD  History of present illness:  Patient is a 65 year old female presenting today as above. He has a history of being found with anemia, symptomatic with a hemoglobin of 6.5. This was a little over a month ago. She has been transfused and this is been relatively stable since then she was noted to be microcytic. She had been on some L Oquist for atrial fibrillation that has been stopped for about a month. She denies use of any aspirin or blood thinning agents otherwise as well as use of NSAIDs. He is currently taking Protonix 40 mg once a day.  There is no nausea vomiting or abdominal pain. She has had some weight loss of about 30 pounds or past for 5 months. She seen no black stools or blood in the stool.    Current Facility-Administered Medications:  .  0.9 %  sodium chloride infusion, , Intravenous, Continuous, Lollie Sails, MD .  0.9 %  sodium chloride infusion, , Intravenous, Continuous, Lollie Sails, MD  Prescriptions Prior to Admission  Medication Sig Dispense Refill Last Dose  . albuterol (PROVENTIL HFA;VENTOLIN HFA) 108 (90 Base) MCG/ACT inhaler Inhale 2 puffs into the lungs every 6 (six) hours as needed for wheezing or shortness of breath.   Past Month at Unknown time  . diltiazem (CARDIZEM CD) 120 MG 24 hr capsule Take 300 mg by mouth daily.    07/30/2017 at Unknown time  . fluticasone furoate-vilanterol (BREO ELLIPTA) 200-25 MCG/INH AEPB Inhale 1 puff into the lungs daily.   07/31/2017 at Unknown time  . gabapentin (NEURONTIN) 300 MG capsule Take 300 mg by mouth 2 (two) times daily.   07/30/2017 at Unknown time  . lisinopril (PRINIVIL,ZESTRIL) 20 MG tablet Take 20 mg by mouth daily.   07/30/2017 at Unknown time  . montelukast (SINGULAIR) 10 MG tablet Take 10 mg by mouth at bedtime.   07/30/2017 at Unknown time     No Known  Allergies   Past Medical History:  Diagnosis Date  . Anemia   . Arthritis   . Asthma   . Atrial fibrillation ([place])   . Dysrhythmia    Atrial Fibrillation  . Fibromyalgia   . GERD (gastroesophageal reflux disease)   . Hypertension   . Hypothyroidism     Review of systems:      Physical Exam    Heart and lungs: Regular rate and rhythm without rub or gallop, lungs are bilaterally clear    HEENT: Normocephalic atraumatic eyes are anicteric    Other:     Pertinant exam for procedure: Soft nontender nondistended bowel sounds positive normoactive.    Planned proceedures: EGD and indicated procedures. I have discussed the risks benefits and complications of procedures to include not limited to bleeding, infection, perforation and the risk of sedation and the patient wishes to proceed.    Lollie Sails, MD Gastroenterology 07/31/2017  12:37 PM

## 2017-08-01 ENCOUNTER — Encounter: Payer: Self-pay | Admitting: Gastroenterology

## 2017-08-01 LAB — SURGICAL PATHOLOGY

## 2017-08-08 ENCOUNTER — Other Ambulatory Visit: Payer: Self-pay | Admitting: Gastroenterology

## 2017-08-08 DIAGNOSIS — D509 Iron deficiency anemia, unspecified: Secondary | ICD-10-CM

## 2017-08-15 ENCOUNTER — Ambulatory Visit
Admission: RE | Admit: 2017-08-15 | Discharge: 2017-08-15 | Disposition: A | Discharge status: Home / Self Care | Payer: Medicare Other | Source: Ambulatory Visit | Attending: Gastroenterology | Admitting: Gastroenterology

## 2017-08-15 DIAGNOSIS — D509 Iron deficiency anemia, unspecified: Secondary | ICD-10-CM | POA: Diagnosis not present

## 2017-08-17 NOTE — Anesthesia Postprocedure Evaluation (Signed)
Anesthesia Post Note  Patient: Mary Irwin  Procedure(s) Performed: ESOPHAGOGASTRODUODENOSCOPY (EGD) WITH PROPOFOL (N/A )  Patient location during evaluation: PACU Anesthesia Type: General Level of consciousness: awake and alert Pain management: pain level controlled Vital Signs Assessment: post-procedure vital signs reviewed and stable Respiratory status: spontaneous breathing, nonlabored ventilation, respiratory function stable and patient connected to nasal cannula oxygen Cardiovascular status: blood pressure returned to baseline and stable Postop Assessment: no apparent nausea or vomiting Anesthetic complications: no     Last Vitals:  Vitals:   07/31/17 1322 07/31/17 1332  BP: 138/78 134/79  Pulse: 84 82  Resp: 17 15  Temp:    SpO2: 98% 99%    Last Pain:  Vitals:   08/01/17 0724  TempSrc:   PainSc: 0-No pain                 Molli Barrows

## 2017-09-20 ENCOUNTER — Encounter: Payer: Self-pay | Admitting: Emergency Medicine

## 2017-09-20 ENCOUNTER — Observation Stay
Admission: EM | Admit: 2017-09-20 | Discharge: 2017-09-23 | Disposition: A | Discharge status: Home / Self Care | Payer: Medicare Other | Attending: General Surgery | Admitting: General Surgery

## 2017-09-20 ENCOUNTER — Other Ambulatory Visit: Payer: Self-pay

## 2017-09-20 ENCOUNTER — Emergency Department: Payer: Medicare Other

## 2017-09-20 DIAGNOSIS — M1990 Unspecified osteoarthritis, unspecified site: Secondary | ICD-10-CM | POA: Insufficient documentation

## 2017-09-20 DIAGNOSIS — Z803 Family history of malignant neoplasm of breast: Secondary | ICD-10-CM | POA: Insufficient documentation

## 2017-09-20 DIAGNOSIS — Z96653 Presence of artificial knee joint, bilateral: Secondary | ICD-10-CM | POA: Insufficient documentation

## 2017-09-20 DIAGNOSIS — E039 Hypothyroidism, unspecified: Secondary | ICD-10-CM | POA: Diagnosis not present

## 2017-09-20 DIAGNOSIS — I4891 Unspecified atrial fibrillation: Secondary | ICD-10-CM | POA: Diagnosis not present

## 2017-09-20 DIAGNOSIS — D649 Anemia, unspecified: Secondary | ICD-10-CM | POA: Diagnosis not present

## 2017-09-20 DIAGNOSIS — K219 Gastro-esophageal reflux disease without esophagitis: Secondary | ICD-10-CM | POA: Diagnosis not present

## 2017-09-20 DIAGNOSIS — J45909 Unspecified asthma, uncomplicated: Secondary | ICD-10-CM | POA: Insufficient documentation

## 2017-09-20 DIAGNOSIS — Z79899 Other long term (current) drug therapy: Secondary | ICD-10-CM | POA: Diagnosis not present

## 2017-09-20 DIAGNOSIS — G8929 Other chronic pain: Secondary | ICD-10-CM | POA: Diagnosis not present

## 2017-09-20 DIAGNOSIS — I10 Essential (primary) hypertension: Secondary | ICD-10-CM | POA: Diagnosis not present

## 2017-09-20 DIAGNOSIS — M797 Fibromyalgia: Secondary | ICD-10-CM | POA: Diagnosis not present

## 2017-09-20 DIAGNOSIS — K819 Cholecystitis, unspecified: Secondary | ICD-10-CM | POA: Diagnosis not present

## 2017-09-20 DIAGNOSIS — K828 Other specified diseases of gallbladder: Secondary | ICD-10-CM | POA: Insufficient documentation

## 2017-09-20 DIAGNOSIS — K8012 Calculus of gallbladder with acute and chronic cholecystitis without obstruction: Principal | ICD-10-CM | POA: Insufficient documentation

## 2017-09-20 DIAGNOSIS — Z8249 Family history of ischemic heart disease and other diseases of the circulatory system: Secondary | ICD-10-CM | POA: Diagnosis not present

## 2017-09-20 LAB — COMPREHENSIVE METABOLIC PANEL
ALBUMIN: 4.4 g/dL (ref 3.5–5.0)
ALT: 16 U/L (ref 14–54)
AST: 18 U/L (ref 15–41)
Alkaline Phosphatase: 116 U/L (ref 38–126)
Anion gap: 9 (ref 5–15)
BUN: 13 mg/dL (ref 6–20)
CHLORIDE: 108 mmol/L (ref 101–111)
CO2: 23 mmol/L (ref 22–32)
CREATININE: 0.64 mg/dL (ref 0.44–1.00)
Calcium: 9.8 mg/dL (ref 8.9–10.3)
GFR calc Af Amer: >60 mL/min (ref >60)
GFR calc non Af Amer: >60 mL/min (ref >60)
GLUCOSE: 85 mg/dL (ref 65–99)
POTASSIUM: 3.9 mmol/L (ref 3.5–5.1)
SODIUM: 140 mmol/L (ref 135–145)
Total Bilirubin: 0.5 mg/dL (ref 0.3–1.2)
Total Protein: 8.1 g/dL (ref 6.5–8.1)

## 2017-09-20 LAB — URINALYSIS, COMPLETE (UACMP) WITH MICROSCOPIC
Bacteria, UA: NONE SEEN
Bilirubin Urine: NEGATIVE
GLUCOSE, UA: NEGATIVE mg/dL
HGB URINE DIPSTICK: NEGATIVE
KETONES UR: NEGATIVE mg/dL
LEUKOCYTES UA: NEGATIVE
Nitrite: NEGATIVE
PROTEIN: NEGATIVE mg/dL
Specific Gravity, Urine: 1.006 (ref 1.005–1.030)
pH: 6 (ref 5.0–8.0)

## 2017-09-20 LAB — CBC
HEMATOCRIT: 41 % (ref 35.0–47.0)
Hemoglobin: 13 g/dL (ref 12.0–16.0)
MCH: 26.5 pg (ref 26.0–34.0)
MCHC: 31.8 g/dL — ABNORMAL LOW (ref 32.0–36.0)
MCV: 83.4 fL (ref 80.0–100.0)
PLATELETS: 334 10*3/uL (ref 150–440)
RBC: 4.92 MIL/uL (ref 3.80–5.20)
RDW: 14 % (ref 11.5–14.5)
WBC: 10.4 10*3/uL (ref 3.6–11.0)

## 2017-09-20 LAB — LIPASE, BLOOD: LIPASE: 22 U/L (ref 11–51)

## 2017-09-20 MED ORDER — DIPHENHYDRAMINE HCL 12.5 MG/5ML PO ELIX
12.5000 mg | ORAL_SOLUTION | Freq: Four times a day (QID) | ORAL | Status: DC | PRN
  Filled 2017-09-20: qty 5

## 2017-09-20 MED ORDER — LISINOPRIL 10 MG PO TABS
ORAL_TABLET | ORAL | Status: AC
  Administered 2017-09-20: 20 mg via ORAL
  Filled 2017-09-20: qty 2

## 2017-09-20 MED ORDER — MONTELUKAST SODIUM 10 MG PO TABS
10.0000 mg | ORAL_TABLET | Freq: Every day | ORAL | Status: DC
  Administered 2017-09-20 – 2017-09-22 (×3): 10 mg via ORAL
  Filled 2017-09-20 (×4): qty 1

## 2017-09-20 MED ORDER — MORPHINE SULFATE (PF) 4 MG/ML IV SOLN: 4.0000 mg | INTRAVENOUS | Status: DC | PRN

## 2017-09-20 MED ORDER — ZOLPIDEM TARTRATE 5 MG PO TABS: 5.0000 mg | ORAL_TABLET | Freq: Every evening | ORAL | Status: DC | PRN

## 2017-09-20 MED ORDER — LACTATED RINGERS IV SOLN
INTRAVENOUS | Status: DC
  Administered 2017-09-20 – 2017-09-21 (×2): via INTRAVENOUS

## 2017-09-20 MED ORDER — HYDRALAZINE HCL 20 MG/ML IJ SOLN: 10.0000 mg | INTRAMUSCULAR | Status: DC | PRN

## 2017-09-20 MED ORDER — ONDANSETRON 4 MG PO TBDP: 4.0000 mg | ORAL_TABLET | Freq: Four times a day (QID) | ORAL | Status: DC | PRN

## 2017-09-20 MED ORDER — LISINOPRIL 20 MG PO TABS
20.0000 mg | ORAL_TABLET | Freq: Every day | ORAL | Status: DC
  Administered 2017-09-20 – 2017-09-23 (×3): 20 mg via ORAL
  Filled 2017-09-20 (×2): qty 1

## 2017-09-20 MED ORDER — ALBUTEROL SULFATE (2.5 MG/3ML) 0.083% IN NEBU: 3.0000 mL | INHALATION_SOLUTION | Freq: Four times a day (QID) | RESPIRATORY_TRACT | Status: DC | PRN

## 2017-09-20 MED ORDER — ONDANSETRON HCL 4 MG/2ML IJ SOLN
4.0000 mg | Freq: Four times a day (QID) | INTRAMUSCULAR | Status: DC | PRN
  Administered 2017-09-22: 4 mg via INTRAVENOUS
  Filled 2017-09-20: qty 2

## 2017-09-20 MED ORDER — DILTIAZEM HCL ER COATED BEADS 120 MG PO CP24
120.0000 mg | ORAL_CAPSULE | Freq: Every day | ORAL | Status: DC
  Administered 2017-09-20 – 2017-09-23 (×3): 120 mg via ORAL
  Filled 2017-09-20 (×3): qty 1

## 2017-09-20 MED ORDER — DIPHENHYDRAMINE HCL 50 MG/ML IJ SOLN: 12.5000 mg | Freq: Four times a day (QID) | INTRAMUSCULAR | Status: DC | PRN

## 2017-09-20 MED ORDER — PIPERACILLIN-TAZOBACTAM 3.375 G IVPB
3.3750 g | Freq: Three times a day (TID) | INTRAVENOUS | Status: DC
  Administered 2017-09-20 – 2017-09-23 (×7): 3.375 g via INTRAVENOUS
  Filled 2017-09-20 (×7): qty 50

## 2017-09-20 MED ORDER — PANTOPRAZOLE SODIUM 40 MG IV SOLR
40.0000 mg | Freq: Every day | INTRAVENOUS | Status: DC
  Administered 2017-09-20 – 2017-09-22 (×3): 40 mg via INTRAVENOUS
  Filled 2017-09-20 (×3): qty 40

## 2017-09-20 NOTE — H&P (Signed)
Patient ID: Mary Irwin, female   DOB: Jun 03, 1951, 66 y.o.   MRN: [id number]  CC: Abdominal pain  HPI Mary Irwin is a 66 y.o. female presents the ER today with a 1 day history of upper and right-sided abdominal pain.  Patient reports she has chronic abdominal pain but this is different.  Pain has always been on the right side and is of a stabbing in nature.  It has not moved or radiated.  Nothing is made it better or worse.  She ate lunch today without any change in the pain.  Pain has however progressed and worsened throughout today prompting her to come to the emergency department.  She has had an extensive workup as an outpatient for GI without any answers per the patient.  Patient currently denies any fevers, chills, chest pain, shortness of breath, vomiting, diarrhea, constipation.  She is otherwise in her usual state of health.  HPI  Past Medical History:  Diagnosis Date  . Anemia   . Arthritis   . Asthma   . Atrial fibrillation ([place])   . Dysrhythmia    Atrial Fibrillation  . Fibromyalgia   . GERD (gastroesophageal reflux disease)   . Hypertension   . Hypothyroidism     Past Surgical History:  Procedure Laterality Date  . BACK SURGERY    . JOINT REPLACEMENT Bilateral    Total Knee Replacement    Family History  Problem Relation Age of Onset  . Breast cancer Maternal Aunt 41  . Hypertension Mother     Social History Social History   Tobacco Use  . Smoking status: Never Smoker  . Smokeless tobacco: Never Used  Substance Use Topics  . Alcohol use: No  . Drug use: No    No Known Allergies  No current facility-administered medications for this encounter.    Current Outpatient Medications  Medication Sig Dispense Refill  . diltiazem (CARDIZEM CD) 120 MG 24 hr capsule Take 120 mg daily by mouth.     . ferrous sulfate 325 (65 FE) MG EC tablet Take 325 mg daily by mouth.    . fluticasone furoate-vilanterol (BREO ELLIPTA) 200-25 MCG/INH AEPB Inhale 1 puff into  the lungs daily.    [name] gabapentin (NEURONTIN) 300 MG capsule Take 300 mg by mouth 2 (two) times daily.    [name] lisinopril (PRINIVIL,ZESTRIL) 20 MG tablet Take 20 mg by mouth daily.    . montelukast (SINGULAIR) 10 MG tablet Take 10 mg by mouth at bedtime.    . pantoprazole (PROTONIX) 40 MG tablet Take 40 mg 2 (two) times daily by mouth.    [name] albuterol (PROVENTIL HFA;VENTOLIN HFA) 108 (90 Base) MCG/ACT inhaler Inhale 2 puffs into the lungs every 6 (six) hours as needed for wheezing or shortness of breath.       Review of Systems A multi-point review of systems was asked and was negative except for the findings documented in the HPI  Physical Exam Blood pressure (!) 157/61, pulse 86, temperature 98.7 F (37.1 C), temperature source Oral, resp. rate 18, height 5\' 4"  (1.626 m), weight 83 kg (183 lb), SpO2 99 %. CONSTITUTIONAL: Resting in bed no acute distress. EYES: Pupils are equal, round, and reactive to light, Sclera are non-icteric. EARS, NOSE, MOUTH AND THROAT: The oropharynx is clear. The oral mucosa is pink and moist. Hearing is intact to voice. LYMPH NODES:  Lymph nodes in the neck are normal. RESPIRATORY:  Lungs are clear. There is normal respiratory effort, with equal breath  sounds bilaterally, and without pathologic use of accessory muscles. CARDIOVASCULAR: Heart is regular without murmurs, gallops, or rubs. GI: The abdomen is soft, mildly tender in the right upper quadrant but with a negative Murphy sign, and nondistended. There are no palpable masses. There is no hepatosplenomegaly. There are normal bowel sounds in all quadrants. GU: Rectal deferred.   MUSCULOSKELETAL: Normal muscle strength and tone. No cyanosis or edema.   SKIN: Turgor is good and there are no pathologic skin lesions or ulcers. NEUROLOGIC: Motor and sensation is grossly normal. Cranial nerves are grossly intact. PSYCH:  Oriented to person, place and time. Affect is normal.  Data Reviewed Images and labs reviewed.   All labs are within normal limits to include a normal white blood cell count and normal LFTs.  She also had a normal urinalysis.  Right upper quadrant ultrasound shows a distended gallbladder with numerous gallstones.  There is also gallbladder wall thickening with edema and trace pericholecystic fluid but normal common bile duct. I have personally reviewed the patient's imaging, laboratory findings and medical records.    Assessment    Acute cholecystitis    Plan    66 year old female with a history and ultrasound consistent with acute cholecystitis.  Even though her labs are normal her ultrasound is fairly convincing.  Long conversation about the diagnosis with the patient as well as the treatment options.  Also had a frank conversation with the patient about the OR schedule for the next 24 hours to be completely booked.  Discussed the indications for laparoscopic cholecystectomy and the procedure in brief.  Currently our plan will be to bring the patient under observation for IV fluids and IV antibiotics.  Discussed that should her pain completely resolved and then she would be treated with an outpatient course of oral antibiotics.  Should her pain not resolve discussed that she would need to stay in the hospital until she could undergo a cholecystectomy.  Patient voiced understanding and accept this plan.     Time spent with the patient was 50 minutes, with more than 50% of the time spent in face-to-face education, counseling and care coordination.     Clayburn Pert, MD FACS General Surgeon 09/20/2017, 8:30 PM

## 2017-09-20 NOTE — ED Notes (Signed)
Pt presents with RUQ pain. States she has ongoing abdominal pain but that this feels different. Pt's ongoing pain has been in her mid-abdominal area. Pt states she has had nausea for a few months as well. Pt has had colonoscopy, endoscopy, MRI, ultrasound, but no diagnosis for this ongoing pain.

## 2017-09-20 NOTE — ED Provider Notes (Signed)
[facility] Emergency Department Provider Note  Time seen: 6:07 PM  I have reviewed the triage vital signs and the nursing notes.   HISTORY  Chief Complaint Abdominal Pain    HPI Mary Irwin is a 66 y.o. female with a past medical history of anemia, asthma, atrial fibrillation, fibromyalgia, hypertension, gallstones, presents to the emergency department for right upper quadrant abdominal pain.  According to the patient around 5:00 this morning she awoke with upper abdominal discomfort and nausea.  Denies any vomiting, diarrhea, dysuria, fever.  Patient sees GI medicine chronic abdominal pain but states today that the abdominal pain occurred in the upper abdomen when usually it occurs in the lower abdomen.  Patient saw her primary care doctor who noted the patient to have right upper quadrant tenderness and send her to the emergency department for further evaluation.  Patient continues to state moderate right upper quadrant pain, denies any lower abdominal pain more than normal.  Describes as a dull pain.  Past Medical History:  Diagnosis Date  . Anemia   . Arthritis   . Asthma   . Atrial fibrillation ([place])   . Dysrhythmia    Atrial Fibrillation  . Fibromyalgia   . GERD (gastroesophageal reflux disease)   . Hypertension   . Hypothyroidism     Patient Active Problem List   Diagnosis Date Noted  . Anemia 06/01/2017    Past Surgical History:  Procedure Laterality Date  . BACK SURGERY    . JOINT REPLACEMENT Bilateral    Total Knee Replacement    Prior to Admission medications   Medication Sig Start Date End Date Taking? Authorizing Provider  albuterol (PROVENTIL HFA;VENTOLIN HFA) 108 (90 Base) MCG/ACT inhaler Inhale 2 puffs into the lungs every 6 (six) hours as needed for wheezing or shortness of breath.    [provider]  diltiazem (CARDIZEM CD) 120 MG 24 hr capsule Take 300 mg by mouth daily.     [provider]  fluticasone  furoate-vilanterol (BREO ELLIPTA) 200-25 MCG/INH AEPB Inhale 1 puff into the lungs daily.    [provider]  gabapentin (NEURONTIN) 300 MG capsule Take 300 mg by mouth 2 (two) times daily.    [provider]  lisinopril (PRINIVIL,ZESTRIL) 20 MG tablet Take 20 mg by mouth daily.    [provider]  montelukast (SINGULAIR) 10 MG tablet Take 10 mg by mouth at bedtime.    [provider]    No Known Allergies  Family History  Problem Relation Age of Onset  . Breast cancer Maternal Aunt 62  . Hypertension Mother     Social History Social History   Tobacco Use  . Smoking status: Never Smoker  . Smokeless tobacco: Never Used  Substance Use Topics  . Alcohol use: No  . Drug use: No    Review of Systems Constitutional: Negative for fever. Cardiovascular: Negative for chest pain. Respiratory: Negative for shortness of breath. Gastrointestinal: Moderate right upper quadrant abdominal pain.  Negative for vomiting or diarrhea Genitourinary: Negative for dysuria. Neurological: Negative for headache All other ROS negative  ____________________________________________   PHYSICAL EXAM:  VITAL SIGNS: ED Triage Vitals  Enc Vitals Group     BP 09/20/17 1526 (!) 157/61     Pulse Rate 09/20/17 1526 86     Resp 09/20/17 1526 18     Temp 09/20/17 1526 98.7 F (37.1 C)     Temp Source 09/20/17 1526 Oral     SpO2 09/20/17 1526  99 %     Weight 09/20/17 1526 183 lb (83 kg)     Height 09/20/17 1526 5\' 4"  (1.626 m)     Head Circumference --      Peak Flow --      Pain Score 09/20/17 1525 6     Pain Loc --      Pain Edu? --      Excl. in [place]? --     Constitutional: Alert and oriented. Well appearing and in no distress. Eyes: Normal exam ENT   Head: Normocephalic and atraumatic.   Mouth/Throat: Mucous membranes are moist. Cardiovascular: Normal rate, regular rhythm. No murmurs, rubs, or gallops. Respiratory: Normal respiratory effort without  tachypnea nor retractions. Breath sounds are clear Gastrointestinal: Soft, moderate right upper quadrant tenderness to palpation, no rebound or guarding.  No distention.  Mild lower abdominal tenderness but the patient states this is chronic. Musculoskeletal: Nontender with normal range of motion in all extremities Neurologic:  Normal speech and language. No gross focal neurologic deficits Skin:  Skin is warm, dry and intact.  Psychiatric: Mood and affect are normal.  ____________________________________________    RADIOLOGY  Ultrasound shows gallbladder wall thickening with pericholecystic fluid and positive Murphy sign  ____________________________________________   INITIAL IMPRESSION / ASSESSMENT AND PLAN / ED COURSE  Pertinent labs & imaging results that were available during my care of the patient were reviewed by me and considered in my medical decision making (see chart for details).  Patient presents to the emergency department for right upper quadrant abdominal tenderness beginning around 5:00 this morning.  States the pain is improved but she continues to be tender to palpation in the right upper quadrant.  Patient's labs including LFTs are normal.  Normal white blood cell count.  We will obtain an ultrasound of the right upper quadrant to further evaluate as the patient has known gallstones per patient.  Patient agreeable to this plan of care.  I reviewed the patient's records including recent discharge summary from 06/02/17 in which the patient had significant anemia with a hemoglobin of 5.4, was transfused but no source of bleeding identified.  Patient's hemoglobin today is normal.  Likely acute cholecystitis.  Discussed the patient with general surgery who will be down to see the patient.  ____________________________________________   FINAL CLINICAL IMPRESSION(S) / ED DIAGNOSES  Right upper quadrant pain Acute cholecystitis   Harvest Dark, MD 09/20/17 2001

## 2017-09-20 NOTE — ED Triage Notes (Signed)
Patient presents to the ED with diffuse abdominal pain with severe right upper quadrant tenderness.  Patient was sent to the ED by her PCP.  Patient states pain woke her up this morning at 5am and is worse when she leans forward.  Patient reports intermittent abdominal pain x 4 months that she has been seeing GI for, but states this feels somewhat different.  Patient denies vomiting, diarrhea, or fever.

## 2017-09-21 LAB — CBC
HEMATOCRIT: 37.3 % (ref 35.0–47.0)
HEMOGLOBIN: 12 g/dL (ref 12.0–16.0)
MCH: 26.8 pg (ref 26.0–34.0)
MCHC: 32.2 g/dL (ref 32.0–36.0)
MCV: 83.2 fL (ref 80.0–100.0)
Platelets: 296 10*3/uL (ref 150–440)
RBC: 4.48 MIL/uL (ref 3.80–5.20)
RDW: 13.2 % (ref 11.5–14.5)
WBC: 6.7 10*3/uL (ref 3.6–11.0)

## 2017-09-21 LAB — COMPREHENSIVE METABOLIC PANEL
ALBUMIN: 3.6 g/dL (ref 3.5–5.0)
ALT: 13 U/L — ABNORMAL LOW (ref 14–54)
ANION GAP: 9 (ref 5–15)
AST: 17 U/L (ref 15–41)
Alkaline Phosphatase: 106 U/L (ref 38–126)
BUN: 10 mg/dL (ref 6–20)
CALCIUM: 9.2 mg/dL (ref 8.9–10.3)
CHLORIDE: 109 mmol/L (ref 101–111)
CO2: 24 mmol/L (ref 22–32)
Creatinine, Ser: 0.8 mg/dL (ref 0.44–1.00)
GFR calc Af Amer: >60 mL/min (ref >60)
GFR calc non Af Amer: >60 mL/min (ref >60)
GLUCOSE: 87 mg/dL (ref 65–99)
Potassium: 3.7 mmol/L (ref 3.5–5.1)
SODIUM: 142 mmol/L (ref 135–145)
Total Bilirubin: 0.7 mg/dL (ref 0.3–1.2)
Total Protein: 6.5 g/dL (ref 6.5–8.1)

## 2017-09-21 LAB — PROTIME-INR
INR: 1.03
PROTHROMBIN TIME: 13.4 s (ref 11.4–15.2)

## 2017-09-21 LAB — APTT: aPTT: 31 seconds (ref 24–36)

## 2017-09-21 LAB — PHOSPHORUS: Phosphorus: 4.7 mg/dL — ABNORMAL HIGH (ref 2.5–4.6)

## 2017-09-21 LAB — SURGICAL PCR SCREEN
MRSA, PCR: NEGATIVE
Staphylococcus aureus: NEGATIVE

## 2017-09-21 LAB — MAGNESIUM: MAGNESIUM: 1.8 mg/dL (ref 1.7–2.4)

## 2017-09-21 MED ORDER — KETOROLAC TROMETHAMINE 30 MG/ML IJ SOLN
15.0000 mg | Freq: Four times a day (QID) | INTRAMUSCULAR | Status: DC
  Administered 2017-09-21 (×3): 15 mg via INTRAVENOUS
  Administered 2017-09-22: 30 mg via INTRAVENOUS
  Administered 2017-09-22 – 2017-09-23 (×5): 15 mg via INTRAVENOUS
  Filled 2017-09-21 (×8): qty 1

## 2017-09-21 MED ORDER — KCL IN DEXTROSE-NACL 20-5-0.45 MEQ/L-%-% IV SOLN
INTRAVENOUS | Status: DC
  Administered 2017-09-21 – 2017-09-23 (×4): via INTRAVENOUS
  Filled 2017-09-21 (×7): qty 1000

## 2017-09-21 MED ORDER — MORPHINE SULFATE (PF) 2 MG/ML IV SOLN: 2.0000 mg | INTRAVENOUS | Status: DC | PRN

## 2017-09-21 NOTE — Progress Notes (Signed)
09/21/2017  Subjective: Patient admitted last night with acute cholecystitis.  This morning, she still having some right upper quadrant pain.  Denies any nausea at the time.  Vital signs: Temp:  [98 F (36.7 C)-98.7 F (37.1 C)] 98.5 F (36.9 C) (11/15 0947) Pulse Rate:  [72-88] 80 (11/15 0947) Resp:  [16-18] 16 (11/15 0947) BP: (140-158)/(52-66) 147/63 (11/15 0947) SpO2:  [98 %-99 %] 99 % (11/15 0947) Weight:  [83 kg (183 lb)] 83 kg (183 lb) (11/14 1526)   Intake/Output: 11/14 0701 - 11/15 0700 In: 831 [I.V.:731; IV Piggyback:100] Out: 400 [Urine:400] Last BM Date: 09/20/17  Physical Exam: Constitutional: No acute distress Abdomen: Soft, nondistended, tender to palpation in the right upper quadrant.  Labs:  Recent Labs    09/20/17 1531 09/21/17 0531  WBC 10.4 6.7  HGB 13.0 12.0  HCT 41.0 37.3  PLT 334 296   Recent Labs    09/20/17 1531 09/21/17 0531  NA 140 142  K 3.9 3.7  CL 108 109  CO2 23 24  GLUCOSE 85 87  BUN 13 10  CREATININE 0.64 0.80  CALCIUM 9.8 9.2   Recent Labs    09/21/17 0531  LABPROT 13.4  INR 1.03    Imaging: US Abdomen Limited Ruq  Result Date: 09/20/2017 CLINICAL DATA:  Right upper quadrant pain for several months. Increase of pain today. EXAM: ULTRASOUND ABDOMEN LIMITED RIGHT UPPER QUADRANT COMPARISON:  None. FINDINGS: Gallbladder: The gallbladder is normally distended. There are layering gallstones. A gallstone at the gallbladder neck was noted to be immobile. The gallbladder wall is edematous measuring 7.8 mm in maximum diameter with a small amount of pericholecystic fluid. The sonographer reports positive sonographic Murphy's sign. Common bile duct: Diameter: 4.0 mm Liver: No focal lesion identified. Within normal limits in parenchymal echogenicity. Portal vein is patent on color Doppler imaging with normal direction of blood flow towards the liver. IMPRESSION: Edematous gallbladder wall with small amount of pericholecystic fluid and  several layering gallstones, with immobile gallstone at the gallbladder neck. Findings are concerning for acute calculus cholecystitis. These results were called by telephone at the time of interpretation on 09/20/2017 at 7:45 pm to Dr. Harvest Dark , who verbally acknowledged these results. Electronically Signed   By: Fidela Salisbury M.D.   On: 09/20/2017 19:46    Assessment/Plan: 66 year old female with acute cholecystitis.  -Currently her labs were normal with normal LFTs and normal white blood cell count. -Discussed with the patient that at this point there is no time in the operating room that we could do her case today.  We will continue management conservatively with IV antibiotics and n.p.o. diet with IV fluid hydration.  If she were to improve clinically with conservative measures, then will transition to oral antibiotics and advance her diet.  However if there is no improvement then patient will be added to the schedule likely for tomorrow given the lack of OR availability for cholecystectomy.   Melvyn Neth, [place]

## 2017-09-21 NOTE — Care Management Obs Status (Signed)
[place] NOTIFICATION   Patient Details  Name: CRISTYN CROSSNO MRN: [id number] Date of Birth: [birth year]   Medicare Observation Status Notification Given:  Yes(Hard copy signed by patient.  send to HIM)    Beverly Sessions, RN 09/21/2017, 4:16 PM

## 2017-09-21 NOTE — Progress Notes (Signed)
09/21/17 4:58 pm  Patient not feeling better this afternoon.  Still having right upper quadrant tenderness on exam.  Patient aware that there is no time for surgery today and her case has been added to tomorrow afternoon.  Patient does want to try to drink liquids.  Clears ordered tonight and NPO after midnight.   Olean Ree, MD

## 2017-09-22 ENCOUNTER — Observation Stay: Payer: Medicare Other | Admitting: Certified Registered Nurse Anesthetist

## 2017-09-22 ENCOUNTER — Encounter: Payer: Self-pay | Admitting: *Deleted

## 2017-09-22 ENCOUNTER — Encounter
Admission: EM | Disposition: A | Discharge status: Home / Self Care | Payer: Self-pay | Source: Home / Self Care | Attending: Emergency Medicine

## 2017-09-22 DIAGNOSIS — K819 Cholecystitis, unspecified: Secondary | ICD-10-CM | POA: Diagnosis not present

## 2017-09-22 HISTORY — PX: CHOLECYSTECTOMY: SHX55

## 2017-09-22 LAB — COMPREHENSIVE METABOLIC PANEL
ALBUMIN: 3.3 g/dL — AB (ref 3.5–5.0)
ALK PHOS: 92 U/L (ref 38–126)
ALT: 14 U/L (ref 14–54)
ANION GAP: 6 (ref 5–15)
AST: 14 U/L — ABNORMAL LOW (ref 15–41)
BUN: 11 mg/dL (ref 6–20)
CALCIUM: 8.8 mg/dL — AB (ref 8.9–10.3)
CHLORIDE: 111 mmol/L (ref 101–111)
CO2: 25 mmol/L (ref 22–32)
Creatinine, Ser: 0.73 mg/dL (ref 0.44–1.00)
GFR calc non Af Amer: >60 mL/min (ref >60)
GLUCOSE: 113 mg/dL — AB (ref 65–99)
POTASSIUM: 3.9 mmol/L (ref 3.5–5.1)
SODIUM: 142 mmol/L (ref 135–145)
Total Bilirubin: 0.6 mg/dL (ref 0.3–1.2)
Total Protein: 6.2 g/dL — ABNORMAL LOW (ref 6.5–8.1)

## 2017-09-22 SURGERY — LAPAROSCOPIC CHOLECYSTECTOMY WITH INTRAOPERATIVE CHOLANGIOGRAM
Anesthesia: General | Site: Abdomen | Wound class: Clean Contaminated

## 2017-09-22 MED ORDER — PROPOFOL 10 MG/ML IV BOLUS
INTRAVENOUS | Status: AC
  Filled 2017-09-22: qty 20

## 2017-09-22 MED ORDER — ROCURONIUM BROMIDE 100 MG/10ML IV SOLN
INTRAVENOUS | Status: DC | PRN
  Administered 2017-09-22: 30 mg via INTRAVENOUS
  Administered 2017-09-22: 50 mg via INTRAVENOUS

## 2017-09-22 MED ORDER — FENTANYL CITRATE (PF) 100 MCG/2ML IJ SOLN
INTRAMUSCULAR | Status: DC | PRN
  Administered 2017-09-22: 100 ug via INTRAVENOUS

## 2017-09-22 MED ORDER — FAMOTIDINE 20 MG PO TABS
ORAL_TABLET | ORAL | Status: AC
  Administered 2017-09-22: 20 mg via ORAL
  Filled 2017-09-22: qty 1

## 2017-09-22 MED ORDER — FENTANYL CITRATE (PF) 100 MCG/2ML IJ SOLN
INTRAMUSCULAR | Status: AC
  Filled 2017-09-22: qty 2

## 2017-09-22 MED ORDER — BUPIVACAINE-EPINEPHRINE (PF) 0.25% -1:200000 IJ SOLN
INTRAMUSCULAR | Status: AC
  Filled 2017-09-22: qty 30

## 2017-09-22 MED ORDER — LACTATED RINGERS IV SOLN
INTRAVENOUS | Status: DC
  Administered 2017-09-22: 100 mL/h via INTRAVENOUS
  Administered 2017-09-22: 14:00:00 via INTRAVENOUS

## 2017-09-22 MED ORDER — FAMOTIDINE 20 MG PO TABS
20.0000 mg | ORAL_TABLET | Freq: Once | ORAL | Status: AC
  Administered 2017-09-22: 20 mg via ORAL

## 2017-09-22 MED ORDER — SUGAMMADEX SODIUM 200 MG/2ML IV SOLN
INTRAVENOUS | Status: DC | PRN
  Administered 2017-09-22: 200 mg via INTRAVENOUS

## 2017-09-22 MED ORDER — PROPOFOL 500 MG/50ML IV EMUL
INTRAVENOUS | Status: DC | PRN
  Administered 2017-09-22: 150 ug/kg/min via INTRAVENOUS

## 2017-09-22 MED ORDER — ONDANSETRON HCL 4 MG/2ML IJ SOLN: 4.0000 mg | Freq: Once | INTRAMUSCULAR | Status: DC | PRN

## 2017-09-22 MED ORDER — OXYCODONE HCL 5 MG PO TABS
5.0000 mg | ORAL_TABLET | ORAL | Status: DC | PRN
  Administered 2017-09-22: 10 mg via ORAL
  Filled 2017-09-22: qty 2

## 2017-09-22 MED ORDER — CEFAZOLIN SODIUM-DEXTROSE 2-3 GM-%(50ML) IV SOLR
INTRAVENOUS | Status: DC | PRN
  Administered 2017-09-22: 2 g via INTRAVENOUS

## 2017-09-22 MED ORDER — ACETAMINOPHEN 10 MG/ML IV SOLN
INTRAVENOUS | Status: DC | PRN
  Administered 2017-09-22: 1000 mg via INTRAVENOUS

## 2017-09-22 MED ORDER — FENTANYL CITRATE (PF) 100 MCG/2ML IJ SOLN: 25.0000 ug | INTRAMUSCULAR | Status: DC | PRN

## 2017-09-22 MED ORDER — MIDAZOLAM HCL 2 MG/2ML IJ SOLN
INTRAMUSCULAR | Status: DC | PRN
  Administered 2017-09-22: 2 mg via INTRAVENOUS

## 2017-09-22 MED ORDER — MIDAZOLAM HCL 2 MG/2ML IJ SOLN
INTRAMUSCULAR | Status: AC
  Filled 2017-09-22: qty 2

## 2017-09-22 MED ORDER — ACETAMINOPHEN 500 MG PO TABS: 1000.0000 mg | ORAL_TABLET | Freq: Four times a day (QID) | ORAL | Status: DC | PRN

## 2017-09-22 MED ORDER — LIDOCAINE HCL (CARDIAC) 20 MG/ML IV SOLN
INTRAVENOUS | Status: DC | PRN
  Administered 2017-09-22: 50 mg via INTRAVENOUS

## 2017-09-22 MED ORDER — PROPOFOL 10 MG/ML IV BOLUS
INTRAVENOUS | Status: DC | PRN
  Administered 2017-09-22: 140 mg via INTRAVENOUS

## 2017-09-22 MED ORDER — DEXAMETHASONE SODIUM PHOSPHATE 10 MG/ML IJ SOLN
INTRAMUSCULAR | Status: DC | PRN
  Administered 2017-09-22: 10 mg via INTRAVENOUS

## 2017-09-22 MED ORDER — BUPIVACAINE-EPINEPHRINE (PF) 0.25% -1:200000 IJ SOLN
INTRAMUSCULAR | Status: DC | PRN
  Administered 2017-09-22: 20 mL via PERINEURAL

## 2017-09-22 SURGICAL SUPPLY — 43 items
APPLIER CLIP 5 13 M/L LIGAMAX5 (MISCELLANEOUS) ×3
BLADE SURG 15 STRL LF DISP TIS (BLADE) ×1 IMPLANT
BLADE SURG 15 STRL SS (BLADE) ×2
CANISTER SUCT 1200ML W/VALVE (MISCELLANEOUS) ×3 IMPLANT
CATH CHOLANGI 4FR 420404F (CATHETERS) IMPLANT
CHLORAPREP W/TINT 26ML (MISCELLANEOUS) ×3 IMPLANT
CLIP APPLIE 5 13 M/L LIGAMAX5 (MISCELLANEOUS) ×1 IMPLANT
CONRAY 60ML FOR OR (MISCELLANEOUS) IMPLANT
DERMABOND ADVANCED (GAUZE/BANDAGES/DRESSINGS) ×2
DERMABOND ADVANCED .7 DNX12 (GAUZE/BANDAGES/DRESSINGS) ×1 IMPLANT
DRAPE C-ARM XRAY 36X54 (DRAPES) IMPLANT
ELECT REM PT RETURN 9FT ADLT (ELECTROSURGICAL) ×3
ELECTRODE REM PT RTRN 9FT ADLT (ELECTROSURGICAL) ×1 IMPLANT
FILTER LAP SMOKE EVAC STRL (MISCELLANEOUS) ×3 IMPLANT
GLOVE SURG SYN 7.0 (GLOVE) ×3 IMPLANT
GLOVE SURG SYN 7.5  E (GLOVE) ×2
GLOVE SURG SYN 7.5 E (GLOVE) ×1 IMPLANT
GOWN STRL REUS W/ TWL LRG LVL3 (GOWN DISPOSABLE) ×5 IMPLANT
GOWN STRL REUS W/TWL LRG LVL3 (GOWN DISPOSABLE) ×10
IRRIGATION STRYKERFLOW (MISCELLANEOUS) ×1 IMPLANT
IRRIGATOR STRYKERFLOW (MISCELLANEOUS) ×3
IV CATH ANGIO 12GX3 LT BLUE (NEEDLE) ×3 IMPLANT
IV NS 1000ML (IV SOLUTION)
IV NS 1000ML BAXH (IV SOLUTION) IMPLANT
JACKSON PRATT 10 (INSTRUMENTS) IMPLANT
L-HOOK LAP DISP 36CM (ELECTROSURGICAL) ×3
LABEL OR SOLS (LABEL) ×3 IMPLANT
LHOOK LAP DISP 36CM (ELECTROSURGICAL) ×1 IMPLANT
NDL SAFETY 22GX1.5 (NEEDLE) ×3 IMPLANT
NEEDLE HYPO 22GX1.5 SAFETY (NEEDLE) ×3 IMPLANT
PACK LAP CHOLECYSTECTOMY (MISCELLANEOUS) ×3 IMPLANT
PENCIL ELECTRO HAND CTR (MISCELLANEOUS) ×3 IMPLANT
POUCH SPECIMEN RETRIEVAL 10MM (ENDOMECHANICALS) ×3 IMPLANT
SCISSORS METZENBAUM CVD 33 (INSTRUMENTS) ×3 IMPLANT
SLEEVE ADV FIXATION 5X100MM (TROCAR) ×9 IMPLANT
SPONGE VERSALON 4X4 4PLY (MISCELLANEOUS) IMPLANT
SUT MNCRL 4-0 (SUTURE) ×2
SUT MNCRL 4-0 27XMFL (SUTURE) ×1
SUT VICRYL 0 AB UR-6 (SUTURE) ×3 IMPLANT
SUTURE MNCRL 4-0 27XMF (SUTURE) ×1 IMPLANT
TROCAR BALLN GELPORT 12X130M (ENDOMECHANICALS) ×3 IMPLANT
TROCAR Z-THREAD OPTICAL 5X100M (TROCAR) ×3 IMPLANT
TUBING INSUFFLATOR HI FLOW (MISCELLANEOUS) ×3 IMPLANT

## 2017-09-22 NOTE — Anesthesia Post-op Follow-up Note (Signed)
Anesthesia QCDR form completed.        

## 2017-09-22 NOTE — Progress Notes (Signed)
09/22/2017  Subjective: Patient had some worsening pain with clears yesterday.    Vital signs: Temp:  [97 F (36.1 C)-98.1 F (36.7 C)] 97 F (36.1 C) (11/16 1022) Pulse Rate:  [70-87] 87 (11/16 1022) Resp:  [16-18] 17 (11/16 1022) BP: (134-174)/(51-72) 174/72 (11/16 1022) SpO2:  [97 %-99 %] 99 % (11/16 1022) Weight:  [83 kg (183 lb)] 83 kg (183 lb) (11/16 1022)   Intake/Output: 11/15 0701 - 11/16 0700 In: 3049 [P.O.:360; I.V.:2484; IV Piggyback:205] Out: 1600 [Urine:1600] Last BM Date: 09/22/17  Physical Exam: Constitutional: No acute distress Abdomen:  Soft, nondistended, tender to palpation in right upper quadrant.  Labs:  Recent Labs    09/20/17 1531 09/21/17 0531  WBC 10.4 6.7  HGB 13.0 12.0  HCT 41.0 37.3  PLT 334 296   Recent Labs    09/21/17 0531 09/22/17 0352  NA 142 142  K 3.7 3.9  CL 109 111  CO2 24 25  GLUCOSE 87 113*  BUN 10 11  CREATININE 0.80 0.73  CALCIUM 9.2 8.8*   Recent Labs    09/21/17 0531  LABPROT 13.4  INR 1.03    Imaging: No results found.  Assessment/Plan: 66 yo female with cholecystitis  --will add her on to schedule today for laparoscopic cholecystectomy.  Patient understands that pending OR availability, may or may not be able to do her surgery today. --risks of bleeding, infection, injury to surrounding structures were discussed with patient and she's willing to proceed.   Melvyn Neth, [place]

## 2017-09-22 NOTE — Anesthesia Preprocedure Evaluation (Signed)
Anesthesia Evaluation  Patient identified by MRN, date of birth, ID band Patient awake    Reviewed: Allergy & Precautions, H&P , NPO status , Patient's Chart, lab work & pertinent test results, reviewed documented beta blocker date and time   History of Anesthesia Complications Negative for: history of anesthetic complications  Airway Mallampati: III  TM Distance: >3 FB Neck ROM: full    Dental  (+) Caps, Missing, Loose, Dental Advidsory Given, Poor Dentition   Pulmonary neg shortness of breath, asthma , neg sleep apnea, neg COPD, neg recent URI,           Cardiovascular Exercise Tolerance: Good hypertension, (-) angina(-) CAD, (-) Past MI, (-) Cardiac Stents and (-) CABG + dysrhythmias Atrial Fibrillation (-) Valvular Problems/Murmurs     Neuro/Psych neg Seizures  Neuromuscular disease negative psych ROS   GI/Hepatic Neg liver ROS, GERD  ,  Endo/Other  neg diabetesHypothyroidism   Renal/GU negative Renal ROS  negative genitourinary   Musculoskeletal   Abdominal   Peds  Hematology  (+) Blood dyscrasia, anemia ,   Anesthesia Other Findings Past Medical History: No date: Anemia No date: Arthritis No date: Asthma No date: Atrial fibrillation ([facility]) No date: Dysrhythmia     Comment:  Atrial Fibrillation No date: Fibromyalgia No date: GERD (gastroesophageal reflux disease) No date: Hypertension No date: Hypothyroidism   Reproductive/Obstetrics negative OB ROS                             Anesthesia Physical Anesthesia Plan  ASA: III  Anesthesia Plan: General   Post-op Pain Management:    Induction: Intravenous  PONV Risk Score and Plan: 3 and Ondansetron, Dexamethasone and Propofol infusion  Airway Management Planned: Oral ETT  Additional Equipment:   Intra-op Plan:   Post-operative Plan: Extubation in OR  Informed Consent: I have reviewed the patients History and Physical,  chart, labs and discussed the procedure including the risks, benefits and alternatives for the proposed anesthesia with the patient or authorized representative who has indicated his/her understanding and acceptance.   Dental Advisory Given  Plan Discussed with: Anesthesiologist, CRNA and Surgeon  Anesthesia Plan Comments:         Anesthesia Quick Evaluation

## 2017-09-22 NOTE — Transfer of Care (Signed)
Immediate Anesthesia Transfer of Care Note  Patient: Mary Irwin  Procedure(s) Performed: LAPAROSCOPIC CHOLECYSTECTOMY WITH INTRAOPERATIVE CHOLANGIOGRAM (N/A Abdomen)  Patient Location: PACU  Anesthesia Type:General  Level of Consciousness: awake and alert   Airway & Oxygen Therapy: Patient Spontanous Breathing and Patient connected to face mask oxygen  Post-op Assessment: Report given to RN  Post vital signs: Reviewed and stable  Last Vitals:  Vitals:   09/22/17 1535 09/22/17 1536  BP: (!) (P) 153/54 (!) 153/54  Pulse: (!) (P) 118 (!) 117  Resp: (!) (P) 27 (!) 27  Temp: (P) 36.8 C 37 C  SpO2: (P) 95% 95%    Last Pain:  Vitals:   09/22/17 1022  TempSrc: Tympanic  PainSc: 0-No pain         Complications: No apparent anesthesia complications

## 2017-09-22 NOTE — Anesthesia Postprocedure Evaluation (Signed)
Anesthesia Post Note  Patient: Mary Irwin  Procedure(s) Performed: LAPAROSCOPIC CHOLECYSTECTOMY WITH INTRAOPERATIVE CHOLANGIOGRAM (N/A Abdomen)  Patient location during evaluation: PACU Anesthesia Type: General Level of consciousness: awake and alert and oriented Pain management: pain level controlled Vital Signs Assessment: post-procedure vital signs reviewed and stable Respiratory status: spontaneous breathing Cardiovascular status: blood pressure returned to baseline Anesthetic complications: no     Last Vitals:  Vitals:   09/22/17 1658 09/22/17 2037  BP: (!) 141/55 (!) 140/57  Pulse: (!) 102 (!) 109  Resp:  20  Temp: 36.8 C 37 C  SpO2: 94% 93%    Last Pain:  Vitals:   09/22/17 2212  TempSrc:   PainSc: 8                  Saranne Crislip

## 2017-09-22 NOTE — Anesthesia Procedure Notes (Signed)
Procedure Name: Intubation Date/Time: 09/22/2017 2:10 PM Performed by: Lesle Reek, CRNA Pre-anesthesia Checklist: Timeout performed, Patient being monitored, Suction available, Emergency Drugs available and Patient identified Patient Re-evaluated:Patient Re-evaluated prior to induction Oxygen Delivery Method: Circle system utilized Preoxygenation: Pre-oxygenation with 100% oxygen Induction Type: IV induction Ventilation: Mask ventilation without difficulty Laryngoscope Size: Mac and 3 Grade View: Grade II Tube type: Oral Tube size: 7.0 mm Airway Equipment and Method: Stylet Placement Confirmation: ETT inserted through vocal cords under direct vision,  positive ETCO2,  breath sounds checked- equal and bilateral and CO2 detector Secured at: 22 cm

## 2017-09-22 NOTE — Op Note (Signed)
  Procedure Date:  09/22/2017  Pre-operative Diagnosis:  Acute cholecystitis  Post-operative Diagnosis:  Acute cholecystitis  Procedure:  Laparoscopic cholecystectomy  Surgeon:  Melvyn Neth, MD  Anesthesia:  General endotracheal  Estimated Blood Loss:  10 ml  Specimens:  gallbladder  Complications:  None  Indications for Procedure:  This is a 66 y.o. female who presents with abdominal pain and workup revealing acute cholecystitis.  The benefits, complications, treatment options, and expected outcomes were discussed with the patient. The risks of bleeding, infection, recurrence of symptoms, failure to resolve symptoms, bile duct damage, bile duct leak, retained common bile duct stone, bowel injury, and need for further procedures were all discussed with the patient and she was willing to proceed.  Description of Procedure: The patient was correctly identified in the preoperative area and brought into the operating room.  The patient was placed supine with VTE prophylaxis in place.  Appropriate time-outs were performed.  Anesthesia was induced and the patient was intubated.  Appropriate antibiotics were infused.  The abdomen was prepped and draped in a sterile fashion. An infraumbilical incision was made. A cutdown technique was used to enter the abdominal cavity without injury, and a Hasson trocar was inserted.  Pneumoperitoneum was obtained with appropriate opening pressures.  A 5-mm port was placed in the subxiphoid area and two 5-mm ports were placed in the right upper quadrant under direct visualization.  The gallbladder was identified.  The fundus was grasped and retracted cephalad.  Adhesions were lysed bluntly and with electrocautery. The infundibulum was grasped and retracted laterally, exposing the peritoneum overlying the gallbladder.  This was incised with electrocautery and extended on either side of the gallbladder.  The cystic duct and cystic artery were clearly identified  and bluntly dissected.  Both were clipped twice proximally and once distally, cutting in between.  The gallbladder was taken from the gallbladder fossa in a retrograde fashion with electrocautery. While doing so, there was bile spillage.  The gallbladder was placed in an Endocatch bag and brought out via the umbilical incision. The liver bed was inspected and any bleeding was controlled with electrocautery. The right upper quadrant was then inspected again revealing intact clips, no bleeding, and no ductal injury.  The area was thoroughly irrigated to clear fluid return.  The 5 mm ports were removed under direct visualization and the Hasson trocar was removed.  The fascial opening was closed using 0 vicryl suture.  Local anesthetic was infused in all incisions and the incisions were closed with 4-0 Monocryl.  The wounds were cleaned and sealed with DermaBond.  The patient was emerged from anesthesia and extubated and brought to the recovery room for further management.  The patient tolerated the procedure well and all counts were correct at the end of the case.   Melvyn Neth, MD

## 2017-09-23 ENCOUNTER — Encounter: Payer: Self-pay | Admitting: Surgery

## 2017-09-23 MED ORDER — OXYCODONE HCL 5 MG PO TABS: 5.0000 mg | ORAL_TABLET | ORAL | 0 refills | Status: DC | PRN

## 2017-09-23 MED ORDER — IBUPROFEN 600 MG PO TABS: 600.0000 mg | ORAL_TABLET | Freq: Three times a day (TID) | ORAL | 0 refills | Status: DC | PRN

## 2017-09-23 NOTE — Discharge Summary (Signed)
Patient ID: Mary Irwin MRN: [id number] DOB/AGE: 05-05-1951 66 y.o.  Admit date: 09/20/2017 Discharge date: 09/23/2017   Discharge Diagnoses:  Active Problems:   Cholecystitis   Procedures:  Laparoscopic cholecystectomy  Hospital Course:  Patient was admitted on 11/14 with acute cholecystitis.  She went to the OR on 11/16 and had a laparoscopic cholecystectomy.  Post-operatively, she did well and her diet was advanced.  Her pain was well controlled.  She was ambulating and voiding without issues.  On exam, she was in no acute distress with stable vital signs.  Her abdomen was soft, nondistended, appropriately tender to palpation over the incisions.  Incisions were clean, dry, and intact with DermaBond in place.  Consults: None  Disposition: 01-Home or Self Care  Discharge Instructions    Call MD for:  difficulty breathing, headache or visual disturbances   Complete by:  As directed    Call MD for:  persistant nausea and vomiting   Complete by:  As directed    Call MD for:  redness, tenderness, or signs of infection (pain, swelling, redness, odor or green/yellow discharge around incision site)   Complete by:  As directed    Call MD for:  severe uncontrolled pain   Complete by:  As directed    Call MD for:  temperature >100.4   Complete by:  As directed    Diet - low sodium heart healthy   Complete by:  As directed    Discharge instructions   Complete by:  As directed    1.  Patient may shower, but do not scrub wound heavily and dab dry only. 2.  Do not submerge wounds in pool/tub 3.  Do not apply ointments or hydrogen peroxide to the wounds.   Driving Restrictions   Complete by:  As directed    Do not drive while taking narcotics for pain control.   Increase activity slowly   Complete by:  As directed    Lifting restrictions   Complete by:  As directed    No heavy lifting or pushing of more than 10-15 lbs for 4 weeks.   No dressing needed   Complete by:  As directed      Allergies as of 09/23/2017   No Known Allergies     Medication List    TAKE these medications   albuterol 108 (90 Base) MCG/ACT inhaler Commonly known as:  PROVENTIL HFA;VENTOLIN HFA Inhale 2 puffs into the lungs every 6 (six) hours as needed for wheezing or shortness of breath.   BREO ELLIPTA 200-25 MCG/INH Aepb Generic drug:  fluticasone furoate-vilanterol Inhale 1 puff into the lungs daily.   diltiazem 120 MG 24 hr capsule Commonly known as:  CARDIZEM CD Take 120 mg daily by mouth.   ferrous sulfate 325 (65 FE) MG EC tablet Take 325 mg daily by mouth.   gabapentin 300 MG capsule Commonly known as:  NEURONTIN Take 300 mg by mouth 2 (two) times daily.   ibuprofen 600 MG tablet Commonly known as:  ADVIL,MOTRIN Take 1 tablet (600 mg total) every 8 (eight) hours as needed by mouth.   lisinopril 20 MG tablet Commonly known as:  PRINIVIL,ZESTRIL Take 20 mg by mouth daily.   montelukast 10 MG tablet Commonly known as:  SINGULAIR Take 10 mg by mouth at bedtime.   oxyCODONE 5 MG immediate release tablet Commonly known as:  Oxy IR/ROXICODONE Take 1-2 tablets (5-10 mg total) every 4 (four) hours as needed by mouth for moderate pain or  severe pain.   pantoprazole 40 MG tablet Commonly known as:  PROTONIX Take 40 mg 2 (two) times daily by mouth.      Follow-up Information    Craig Ionescu, Jacqulyn Bath, MD Follow up in 3 week(s).   Specialty:  Surgery Why:  Follow up in 2-3 weeks. Contact information: [address] [place] [address] [phone]

## 2017-09-23 NOTE — Progress Notes (Signed)
IV was removed. Discharge instructions, follow-up appointments, and prescriptions were provided to the pt. All questions answered. The pt was taken downstairs via wheelchair by RN.

## 2017-09-26 LAB — SURGICAL PATHOLOGY

## 2017-10-10 ENCOUNTER — Other Ambulatory Visit: Payer: Self-pay

## 2017-10-12 ENCOUNTER — Encounter: Payer: Self-pay | Admitting: Surgery

## 2017-10-12 ENCOUNTER — Ambulatory Visit (INDEPENDENT_AMBULATORY_CARE_PROVIDER_SITE_OTHER): Payer: Medicare Other | Admitting: Surgery

## 2017-10-12 VITALS — BP 162/84 | HR 73 | Temp 98.1°F | Ht 64.0 in | Wt 193.0 lb

## 2017-10-12 DIAGNOSIS — Z09 Encounter for follow-up examination after completed treatment for conditions other than malignant neoplasm: Secondary | ICD-10-CM

## 2017-10-12 NOTE — Progress Notes (Signed)
10/12/2017  HPI: Patient is s/p laparoscopic cholecystectomy on 11/16.  She presents for follow up.  She reports that she still has symptoms that were there preop.  Before surgery, she had been undergoing workup for abdominal pain and had had prior ultrasounds, small bowel follow-through, barium swallow, and EGD which were all negative for any significant pathology.  She thought that when she was admitted to the hospital last month for her cholecystitis that she would get much better but this has not happened.  She reports that she still has some chest fullness sometimes causes her shortness of breath that is constant in nature.  She does have some nausea but no vomiting.  Otherwise denies any fevers or chills.  Of note the symptoms were present before her cholecystitis was diagnosed.  Vital signs: BP (!) 162/84   Pulse 73   Temp 98.1 F (36.7 C) (Oral)   Ht 5\' 4"  (1.626 m)   Wt 87.5 kg (193 lb)   BMI 33.13 kg/m    Physical Exam: Constitutional: No acute distress Abdomen: Soft, nondistended, nontender to palpation.  Incisions are clean dry and intact and healing well.  Assessment/Plan: 66 year old female status post laparoscopic cholecystectomy.  -Discussed with the patient at this point her symptoms do not appear to be related to her gallbladder.  She did have cholecystitis both on ultrasound and intraoperatively and her pathology report confirms this. - She does have a history of asthma and recommended to her that perhaps she should contact her pulmonologist for further evaluation of this to see if this could be contributing to any of her symptoms. -She also has a history of GERD and currently takes Protonix.  Recommended that she give a different antacid to try to see if this could help with her symptoms as well. -Otherwise no further surgical needs at this point.  The patient may follow-up with [place] on an as-needed basis.   Melvyn Neth, [place]

## 2017-10-12 NOTE — Patient Instructions (Signed)
You may want to try a different antacid to see if your symptoms improve.  Please contact Dr.Flemmings office for the breathing issues.  GENERAL POST-OPERATIVE PATIENT INSTRUCTIONS   WOUND CARE INSTRUCTIONS:  Keep a dry clean dressing on the wound if there is drainage. The initial bandage may be removed after 24 hours.  Once the wound has quit draining you may leave it open to air.  If clothing rubs against the wound or causes irritation and the wound is not draining you may cover it with a dry dressing during the daytime.  Try to keep the wound dry and avoid ointments on the wound unless directed to do so.  If the wound becomes bright red and painful or starts to drain infected material that is not clear, please contact your physician immediately.  If the wound is mildly pink and has a thick firm ridge underneath it, this is normal, and is referred to as a healing ridge.  This will resolve over the next 4-6 weeks.  BATHING: You may shower if you have been informed of this by your surgeon. However, Please do not submerge in a tub, hot tub, or pool until incisions are completely sealed or have been told by your surgeon that you may do so.  DIET:  You may eat any foods that you can tolerate.  It is a good idea to eat a high fiber diet and take in plenty of fluids to prevent constipation.  If you do become constipated you may want to take a mild laxative or take ducolax tablets on a daily basis until your bowel habits are regular.  Constipation can be very uncomfortable, along with straining, after recent surgery.  ACTIVITY:  You are encouraged to cough and deep breath or use your incentive spirometer if you were given one, every 15-30 minutes when awake.  This will help prevent respiratory complications and low grade fevers post-operatively if you had a general anesthetic.  You may want to hug a pillow when coughing and sneezing to add additional support to the surgical area, if you had abdominal or  chest surgery, which will decrease pain during these times.  You are encouraged to walk and engage in light activity for the next two weeks.  You should not lift more than 20 pounds, until 11/03/17/ as it could put you at increased risk for complications.  Twenty pounds is roughly equivalent to a plastic bag of groceries. At that time- Listen to your body when lifting, if you have pain when lifting, stop and then try again in a few days. Soreness after doing exercises or activities of daily living is normal as you get back in to your normal routine.  MEDICATIONS:  Try to take narcotic medications and anti-inflammatory medications, such as tylenol, ibuprofen, naprosyn, etc., with food.  This will minimize stomach upset from the medication.  Should you develop nausea and vomiting from the pain medication, or develop a rash, please discontinue the medication and contact your physician.  You should not drive, make important decisions, or operate machinery when taking narcotic pain medication.  SUNBLOCK Use sun block to incision area over the next year if this area will be exposed to sun. This helps decrease scarring and will allow you avoid a permanent darkened area over your incision.  QUESTIONS:  Please feel free to call our office if you have any questions, and we will be glad to assist you. [phone]

## 2018-05-08 ENCOUNTER — Other Ambulatory Visit: Payer: Self-pay | Admitting: Gastroenterology

## 2018-05-08 DIAGNOSIS — R6881 Early satiety: Secondary | ICD-10-CM

## 2018-05-08 DIAGNOSIS — R110 Nausea: Secondary | ICD-10-CM

## 2018-06-02 ENCOUNTER — Ambulatory Visit
Admission: RE | Admit: 2018-06-02 | Discharge: 2018-06-02 | Disposition: A | Discharge status: Home / Self Care | Payer: Medicare Other | Source: Ambulatory Visit | Attending: Gastroenterology | Admitting: Gastroenterology

## 2018-06-02 DIAGNOSIS — R110 Nausea: Secondary | ICD-10-CM | POA: Insufficient documentation

## 2018-06-02 DIAGNOSIS — R6881 Early satiety: Secondary | ICD-10-CM | POA: Diagnosis not present

## 2018-06-02 MED ORDER — TECHNETIUM TC 99M SULFUR COLLOID
2.0000 | Freq: Once | INTRAVENOUS | Status: AC | PRN
  Administered 2018-06-02: 2.26 via ORAL

## 2018-08-06 DIAGNOSIS — I493 Ventricular premature depolarization: Secondary | ICD-10-CM | POA: Insufficient documentation

## 2019-02-18 ENCOUNTER — Ambulatory Visit: Admit: 2019-02-18 | Payer: Medicare Other | Admitting: Ophthalmology

## 2019-02-18 SURGERY — PHACOEMULSIFICATION, CATARACT, WITH IOL INSERTION
Anesthesia: Topical | Laterality: Right

## 2019-03-18 IMAGING — RF DG UGI W/ HIGH DENSITY W/KUB
14 of 15 series · 14 of 15 positions shown · non-contrast
Comparison: None in PACs

CLINICAL DATA: Right upper quadrant pain and discomfort with
burning sensation in the epigastrium as well as lower abdomen.
Chronic diarrhea, increasing shortness of breath. History of asthma,
atrial fibrillation. Incomplete endoscopy 1 week ago.

EXAM:
UPPER GI SERIES WITH KUB
TECHNIQUE: After obtaining a scout radiograph a routine upper GI series was
performed using thin and high density barium. Effervescent crystals
and a barium tablet were administered.
FLUOROSCOPY TIME:  Fluoroscopy Time:  1 minutes, 12 seconds
Radiation Exposure Index (if provided by the fluoroscopic device):
0104 micro Gy per meters square
Number of Acquired Spot Images: 13 ARDS KUB.

[Series 1: t abdomen supine · 0.15mm/px · 1 of 1 slices shown (1 of 2)]
[im 1/1]
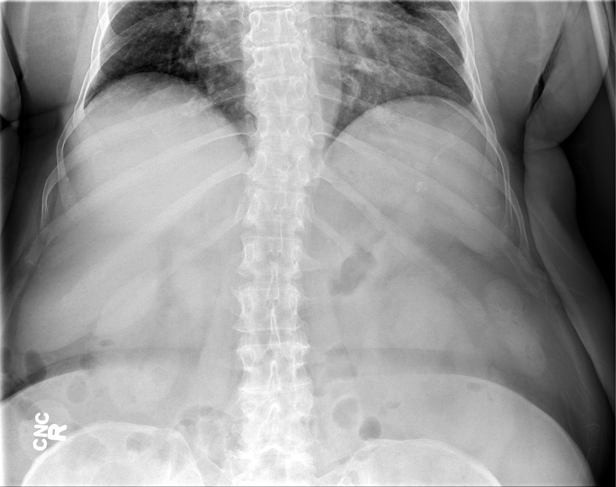

[Series 2: t abdomen supine · 0.15mm/px · 1 of 1 slices shown (2 of 2)]
[im 1/1]
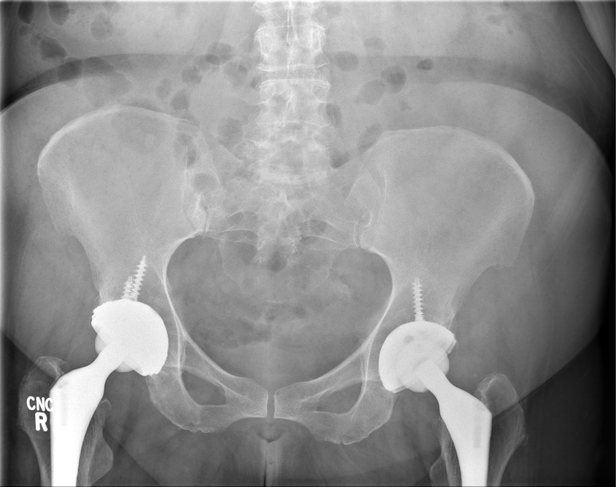

[Series 3: fluoro_barium singleshot_bw · 0.18mm/px · 1 of 1 slices shown (1 of 12)]
[im 1/1]
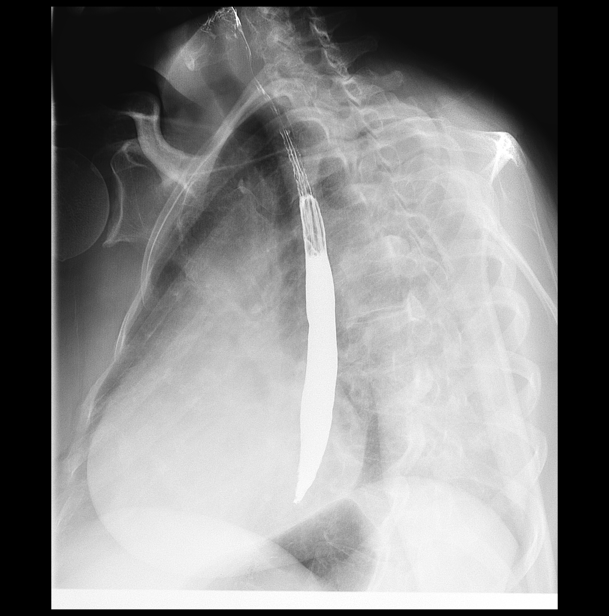

[Series 4: fluoro_barium singleshot_bw · 0.18mm/px · 1 of 1 slices shown (2 of 12)]
[im 1/1]
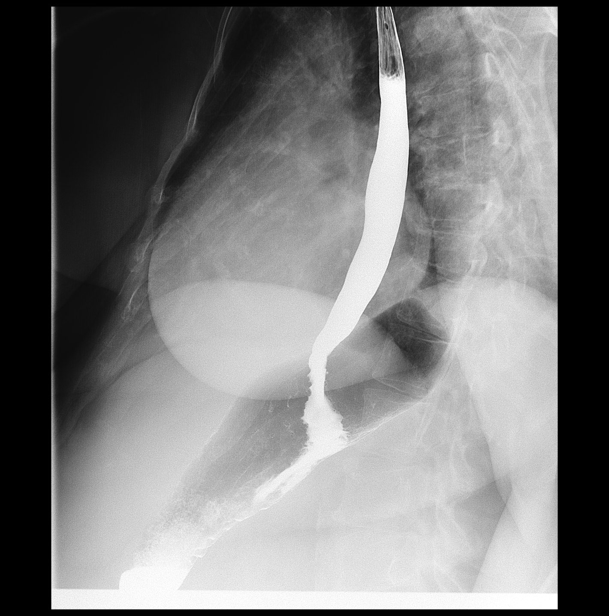

[Series 5: fluoro_barium singleshot_bw · 0.19mm/px · 1 of 1 slices shown (3 of 12)]
[im 1/1]
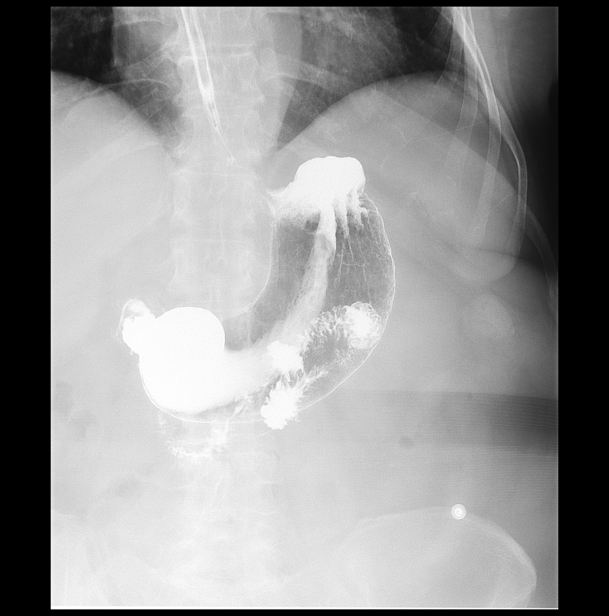

[Series 6: fluoro_barium singleshot_bw · 0.19mm/px · 1 of 1 slices shown (4 of 12)]
[im 1/1]
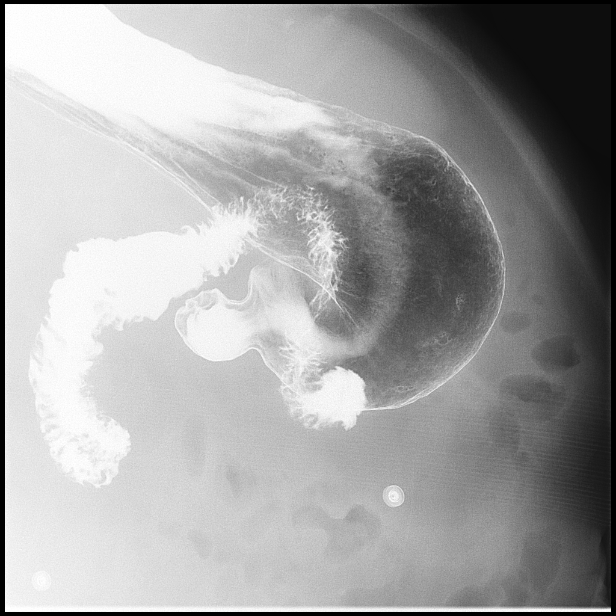

[Series 7: fluoro_barium singleshot_bw · 0.19mm/px · 1 of 1 slices shown (5 of 12)]
[im 1/1]
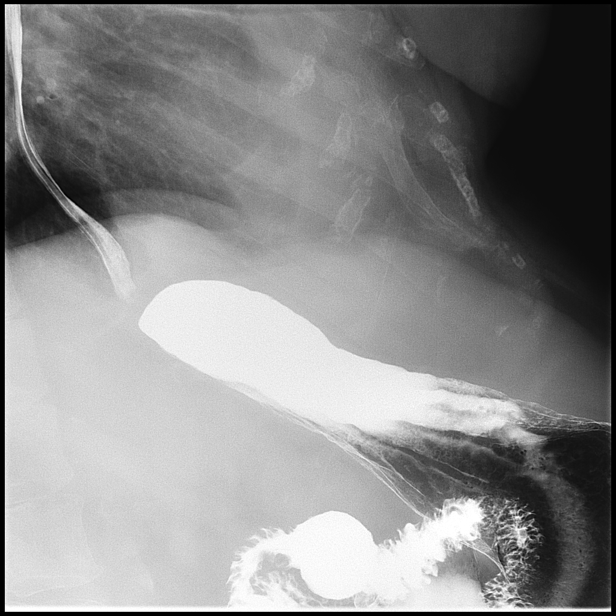

[Series 9: fluoro_barium singleshot_bw · 0.19mm/px · 1 of 1 slices shown (6 of 12)]
[im 1/1]
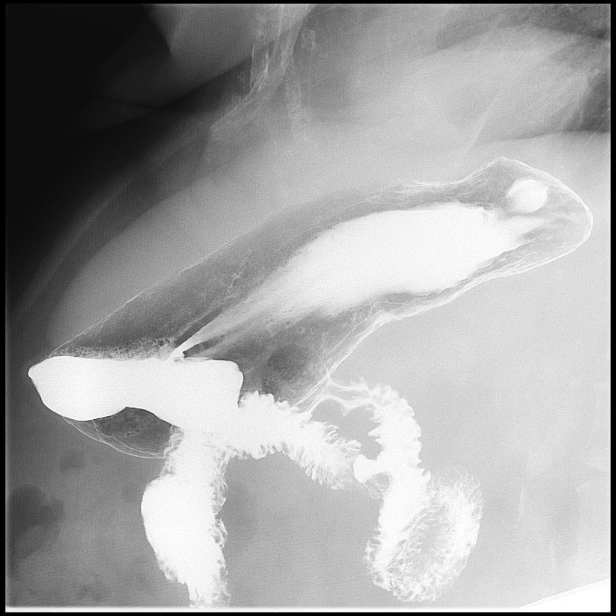

[Series 10: fluoro_barium singleshot_bw · 0.19mm/px · 1 of 1 slices shown (7 of 12)]
[im 1/1]
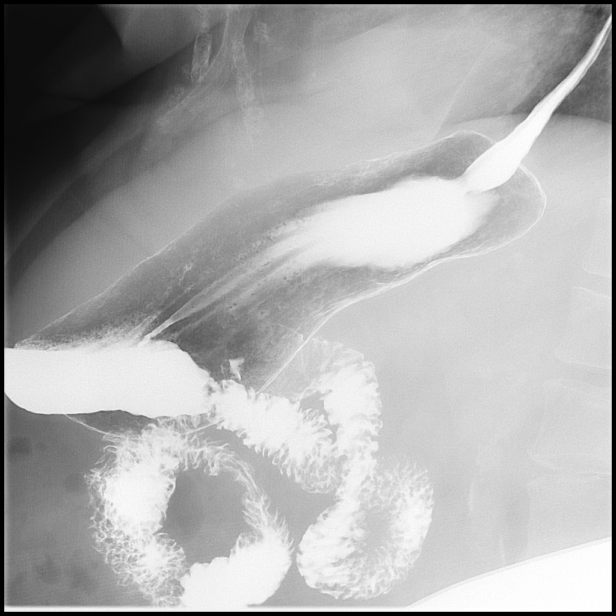

[Series 11: fluoro_barium singleshot_bw · 0.19mm/px · 1 of 1 slices shown (8 of 12)]
[im 1/1]
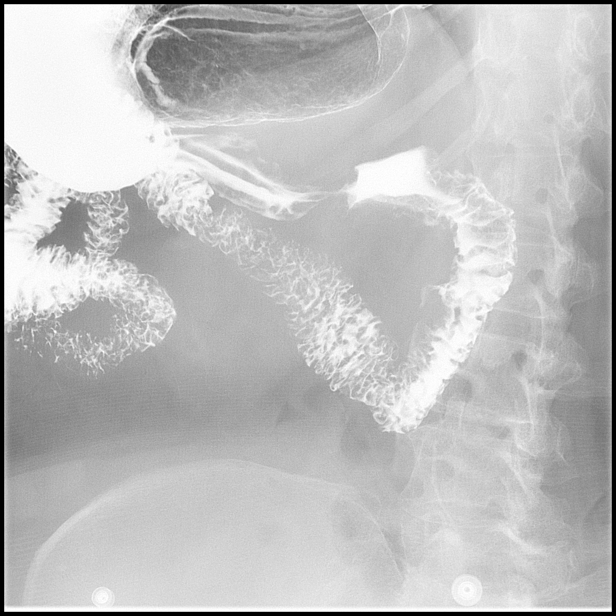

[Series 12: fluoro_barium singleshot_bw · 0.19mm/px · 1 of 1 slices shown (9 of 12)]
[im 1/1]
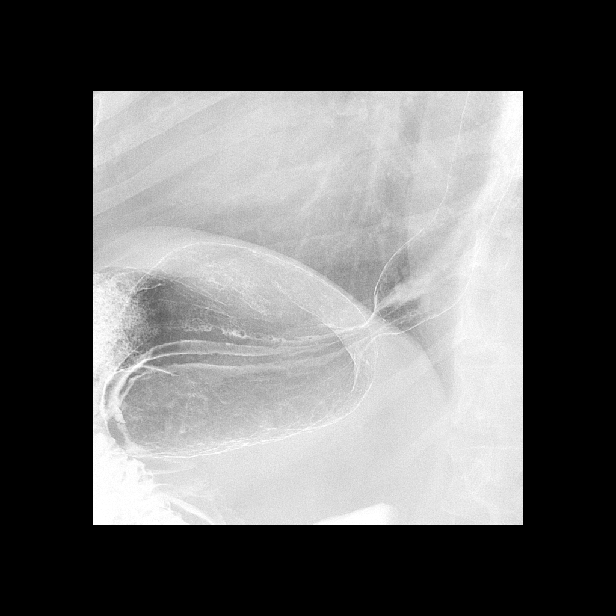

[Series 13: fluoro_barium singleshot_bw · 0.19mm/px · 1 of 1 slices shown (10 of 12)]
[im 1/1]
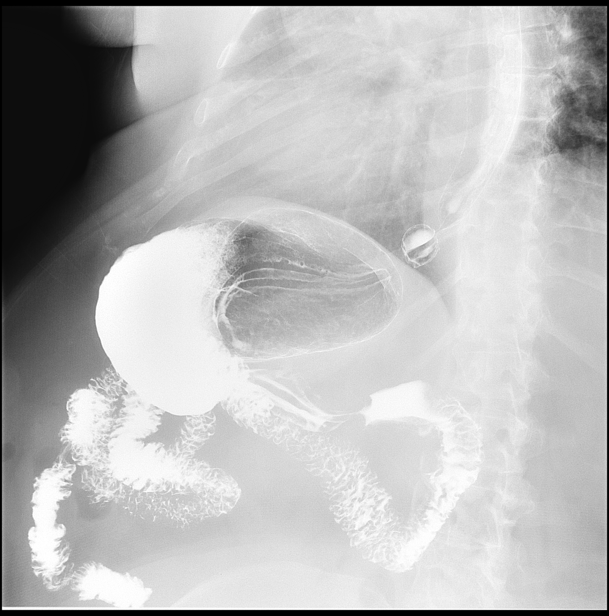

[Series 14: fluoro_barium singleshot_bw · 0.19mm/px · 1 of 1 slices shown (11 of 12)]
[im 1/1]
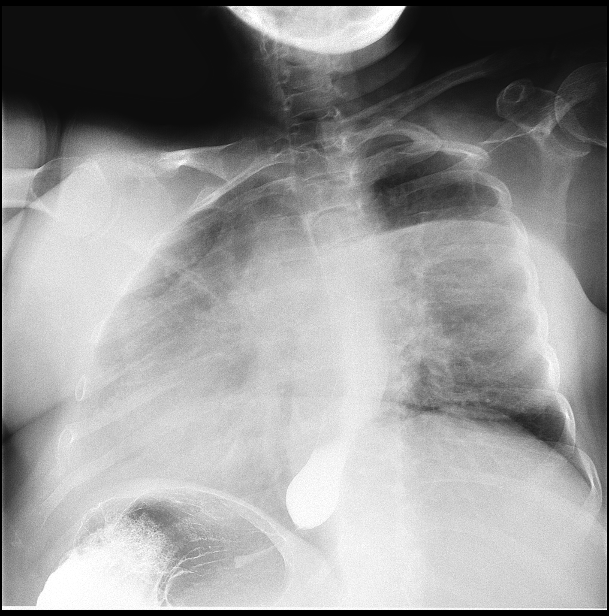

[Series 15: fluoro_barium singleshot_bw · 0.19mm/px · 1 of 1 slices shown (12 of 12)]
[im 1/1]
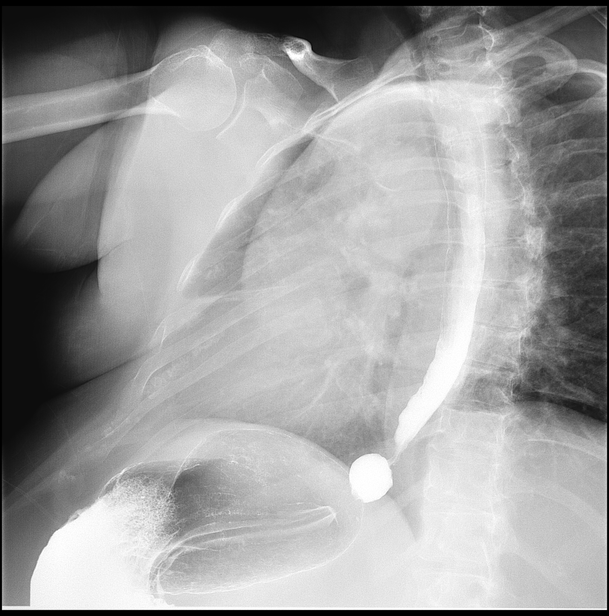

[14 of 15 positions shown; findings below may reference images not displayed]

FINDINGS: The patient was suffering from diarrhea at the time of the
examination and the study was completed as promptly as possible. The
scout image revealed a normal bowel gas pattern. No abnormal soft
tissue calcifications are observed. There prosthetic hips
bilaterally.

The patient ingested thick and thin barium and gas forming crystals
with some difficulty. The volume of barium administered was adequate
for the study. The thoracic esophagus demonstrated normal
distensibility. No stricture was observed. Reflux was observed a
spontaneously. There was a tiny reducible hiatal hernia.

The stomach distended well. The mucosal fold pattern was normal.
Gastric emptying was prompt. The duodenal bulb and Celsius sweep
were normal. There was a diverticulum noted in the second portion of
the duodenum. In the prone position esophageal motility appeared
normal. The barium tablet passed promptly from the mouth to the
stomach.
IMPRESSION: Small amount of gastroesophageal reflex. Tiny reducible hiatal
hernia. No evidence of esophageal stricture or esophagitis.

Normal appearance of the stomach and duodenum.

## 2019-04-16 ENCOUNTER — Other Ambulatory Visit: Payer: Self-pay

## 2019-04-16 ENCOUNTER — Encounter: Payer: Self-pay | Admitting: *Deleted

## 2019-04-18 ENCOUNTER — Other Ambulatory Visit: Payer: Self-pay

## 2019-04-18 ENCOUNTER — Encounter
Admission: RE | Admit: 2019-04-18 | Discharge: 2019-04-18 | Disposition: A | Discharge status: Home / Self Care | Payer: Medicare HMO | Source: Ambulatory Visit | Attending: Ophthalmology | Admitting: Ophthalmology

## 2019-04-18 DIAGNOSIS — Z1159 Encounter for screening for other viral diseases: Secondary | ICD-10-CM | POA: Diagnosis present

## 2019-04-18 NOTE — Discharge Instructions (Signed)

## 2019-04-19 LAB — NOVEL CORONAVIRUS, NAA (HOSP ORDER, SEND-OUT TO REF LAB; TAT 18-24 HRS): SARS-CoV-2, NAA: NOT DETECTED

## 2019-04-22 ENCOUNTER — Ambulatory Visit: Payer: Medicare HMO | Admitting: Anesthesiology

## 2019-04-22 ENCOUNTER — Encounter
Admission: RE | Disposition: A | Discharge status: Home / Self Care | Payer: Self-pay | Source: Home / Self Care | Attending: Ophthalmology

## 2019-04-22 ENCOUNTER — Ambulatory Visit
Admission: RE | Admit: 2019-04-22 | Discharge: 2019-04-22 | Disposition: A | Discharge status: Home / Self Care | Payer: Medicare HMO | Attending: Ophthalmology | Admitting: Ophthalmology

## 2019-04-22 ENCOUNTER — Other Ambulatory Visit: Payer: Self-pay

## 2019-04-22 DIAGNOSIS — Z96652 Presence of left artificial knee joint: Secondary | ICD-10-CM | POA: Insufficient documentation

## 2019-04-22 DIAGNOSIS — K589 Irritable bowel syndrome without diarrhea: Secondary | ICD-10-CM | POA: Diagnosis not present

## 2019-04-22 DIAGNOSIS — J45909 Unspecified asthma, uncomplicated: Secondary | ICD-10-CM | POA: Diagnosis not present

## 2019-04-22 DIAGNOSIS — E669 Obesity, unspecified: Secondary | ICD-10-CM | POA: Diagnosis not present

## 2019-04-22 DIAGNOSIS — Z6834 Body mass index (BMI) 34.0-34.9, adult: Secondary | ICD-10-CM | POA: Diagnosis not present

## 2019-04-22 DIAGNOSIS — K219 Gastro-esophageal reflux disease without esophagitis: Secondary | ICD-10-CM | POA: Diagnosis not present

## 2019-04-22 DIAGNOSIS — I10 Essential (primary) hypertension: Secondary | ICD-10-CM | POA: Insufficient documentation

## 2019-04-22 DIAGNOSIS — Z79899 Other long term (current) drug therapy: Secondary | ICD-10-CM | POA: Insufficient documentation

## 2019-04-22 DIAGNOSIS — I4891 Unspecified atrial fibrillation: Secondary | ICD-10-CM | POA: Insufficient documentation

## 2019-04-22 DIAGNOSIS — D649 Anemia, unspecified: Secondary | ICD-10-CM | POA: Insufficient documentation

## 2019-04-22 DIAGNOSIS — Z7901 Long term (current) use of anticoagulants: Secondary | ICD-10-CM | POA: Insufficient documentation

## 2019-04-22 DIAGNOSIS — H2511 Age-related nuclear cataract, right eye: Secondary | ICD-10-CM | POA: Insufficient documentation

## 2019-04-22 HISTORY — DX: Nausea with vomiting, unspecified: R11.2

## 2019-04-22 HISTORY — DX: Irritable bowel syndrome, unspecified: K58.9

## 2019-04-22 HISTORY — PX: CATARACT EXTRACTION W/PHACO: SHX586

## 2019-04-22 HISTORY — DX: Other specified postprocedural states: Z98.890

## 2019-04-22 SURGERY — PHACOEMULSIFICATION, CATARACT, WITH IOL INSERTION
Anesthesia: Monitor Anesthesia Care | Site: Eye | Laterality: Right

## 2019-04-22 MED ORDER — MIDAZOLAM HCL 2 MG/2ML IJ SOLN
INTRAMUSCULAR | Status: DC | PRN
  Administered 2019-04-22 (×2): 1 mg via INTRAVENOUS

## 2019-04-22 MED ORDER — ARMC OPHTHALMIC DILATING DROPS
1.0000 "application " | OPHTHALMIC | Status: DC | PRN
  Administered 2019-04-22 (×3): 1 via OPHTHALMIC

## 2019-04-22 MED ORDER — SODIUM HYALURONATE 10 MG/ML IO SOLN
INTRAOCULAR | Status: DC | PRN
  Administered 2019-04-22: 0.55 mL via INTRAOCULAR

## 2019-04-22 MED ORDER — MOXIFLOXACIN HCL 0.5 % OP SOLN
OPHTHALMIC | Status: DC | PRN
  Administered 2019-04-22: 0.2 mL via OPHTHALMIC

## 2019-04-22 MED ORDER — TETRACAINE HCL 0.5 % OP SOLN
1.0000 [drp] | OPHTHALMIC | Status: DC | PRN
  Administered 2019-04-22 (×3): 1 [drp] via OPHTHALMIC

## 2019-04-22 MED ORDER — SODIUM HYALURONATE 23 MG/ML IO SOLN
INTRAOCULAR | Status: DC | PRN
  Administered 2019-04-22: 0.6 mL via INTRAOCULAR

## 2019-04-22 MED ORDER — LIDOCAINE HCL (PF) 2 % IJ SOLN
INTRAOCULAR | Status: DC | PRN
  Administered 2019-04-22: 1 mL via INTRAOCULAR

## 2019-04-22 MED ORDER — FENTANYL CITRATE (PF) 100 MCG/2ML IJ SOLN
INTRAMUSCULAR | Status: DC | PRN
  Administered 2019-04-22: 50 ug via INTRAVENOUS

## 2019-04-22 MED ORDER — EPINEPHRINE PF 1 MG/ML IJ SOLN
INTRAOCULAR | Status: DC | PRN
  Administered 2019-04-22: 65 mL via OPHTHALMIC

## 2019-04-22 MED ORDER — ONDANSETRON HCL 4 MG/2ML IJ SOLN: 4.0000 mg | Freq: Once | INTRAMUSCULAR | Status: DC | PRN

## 2019-04-22 SURGICAL SUPPLY — 19 items
CANNULA ANT/CHMB 27G (MISCELLANEOUS) ×2 IMPLANT
CANNULA ANT/CHMB 27GA (MISCELLANEOUS) ×6 IMPLANT
DISSECTOR HYDRO NUCLEUS 50X22 (MISCELLANEOUS) ×3 IMPLANT
GLOVE SURG LX 7.5 STRW (GLOVE) ×4
GLOVE SURG LX STRL 7.5 STRW (GLOVE) ×1 IMPLANT
GLOVE SURG SYN 8.5  E (GLOVE) ×2
GLOVE SURG SYN 8.5 E (GLOVE) ×1 IMPLANT
GLOVE SURG SYN 8.5 PF PI (GLOVE) ×1 IMPLANT
GOWN STRL REUS W/ TWL LRG LVL3 (GOWN DISPOSABLE) ×2 IMPLANT
GOWN STRL REUS W/TWL LRG LVL3 (GOWN DISPOSABLE) ×4
LENS IOL TECNIS ITEC 14.5 (Intraocular Lens) ×2 IMPLANT
MARKER SKIN DUAL TIP RULER LAB (MISCELLANEOUS) ×3 IMPLANT
PACK DR. KING ARMS (PACKS) ×3 IMPLANT
PACK EYE AFTER SURG (MISCELLANEOUS) ×3 IMPLANT
PACK OPTHALMIC (MISCELLANEOUS) ×3 IMPLANT
SYR 3ML LL SCALE MARK (SYRINGE) ×3 IMPLANT
SYR TB 1ML LUER SLIP (SYRINGE) ×3 IMPLANT
WATER STERILE IRR 250ML POUR (IV SOLUTION) ×3 IMPLANT
WIPE NON LINTING 3.25X3.25 (MISCELLANEOUS) ×3 IMPLANT

## 2019-04-22 NOTE — Anesthesia Procedure Notes (Signed)
Procedure Name: MAC Date/Time: 04/22/2019 9:35 AM Performed by: Cameron Ali, CRNA Pre-anesthesia Checklist: Patient identified, Emergency Drugs available, Suction available, Timeout performed and Patient being monitored Patient Re-evaluated:Patient Re-evaluated prior to induction Oxygen Delivery Method: Nasal cannula Placement Confirmation: positive ETCO2

## 2019-04-22 NOTE — Anesthesia Postprocedure Evaluation (Signed)
Anesthesia Post Note  Patient: Mary Irwin  Procedure(s) Performed: CATARACT EXTRACTION PHACO AND INTRAOCULAR LENS PLACEMENT (IOC) RIGHT (Right Eye)  Patient location during evaluation: PACU Anesthesia Type: MAC Level of consciousness: awake Pain management: pain level controlled Vital Signs Assessment: post-procedure vital signs reviewed and stable Respiratory status: respiratory function stable Cardiovascular status: stable Postop Assessment: no signs of nausea or vomiting Anesthetic complications: no    Veda Canning

## 2019-04-22 NOTE — Transfer of Care (Signed)
Immediate Anesthesia Transfer of Care Note  Patient: Mary Irwin  Procedure(s) Performed: CATARACT EXTRACTION PHACO AND INTRAOCULAR LENS PLACEMENT (IOC) RIGHT (Right Eye)  Patient Location: PACU  Anesthesia Type: MAC  Level of Consciousness: awake, alert  and patient cooperative  Airway and Oxygen Therapy: Patient Spontanous Breathing and Patient connected to supplemental oxygen  Post-op Assessment: Post-op Vital signs reviewed, Patient's Cardiovascular Status Stable, Respiratory Function Stable, Patent Airway and No signs of Nausea or vomiting  Post-op Vital Signs: Reviewed and stable  Complications: No apparent anesthesia complications

## 2019-04-22 NOTE — Anesthesia Preprocedure Evaluation (Signed)
Anesthesia Evaluation  Patient identified by MRN, date of birth, ID band Patient awake    Reviewed: Allergy & Precautions, NPO status , Patient's Chart, lab work & pertinent test results  Airway Mallampati: II  TM Distance: >3 FB     Dental   Pulmonary asthma ,    breath sounds clear to auscultation       Cardiovascular hypertension, + dysrhythmias Atrial Fibrillation  Rhythm:Regular Rate:Normal     Neuro/Psych Depression    GI/Hepatic GERD  ,IBS   Endo/Other  Hypothyroidism Obesity - BMI 34  Renal/GU      Musculoskeletal  (+) Arthritis , Fibromyalgia -  Abdominal   Peds  Hematology  (+) anemia ,   Anesthesia Other Findings   Reproductive/Obstetrics                             Anesthesia Physical Anesthesia Plan  ASA: III  Anesthesia Plan: MAC   Post-op Pain Management:    Induction: Intravenous  PONV Risk Score and Plan: TIVA and Midazolam  Airway Management Planned: Nasal Cannula and Natural Airway  Additional Equipment:   Intra-op Plan:   Post-operative Plan:   Informed Consent: I have reviewed the patients History and Physical, chart, labs and discussed the procedure including the risks, benefits and alternatives for the proposed anesthesia with the patient or authorized representative who has indicated his/her understanding and acceptance.       Plan Discussed with: CRNA  Anesthesia Plan Comments:         Anesthesia Quick Evaluation

## 2019-04-22 NOTE — H&P (Signed)

## 2019-04-22 NOTE — Op Note (Signed)
OPERATIVE NOTE  Mary Irwin [id number] 04/22/2019   PREOPERATIVE DIAGNOSIS:  Nuclear sclerotic cataract right eye.  H25.11   POSTOPERATIVE DIAGNOSIS:    Nuclear sclerotic cataract right eye.     PROCEDURE:  Phacoemusification with posterior chamber intraocular lens placement of the right eye   LENS:   Implant Name Type Inv. Item Serial No. Manufacturer Lot No. LRB No. Used Action  LENS IOL DIOP 14.5 - [id number] Intraocular Lens LENS IOL DIOP 14.5 [id number] AMO  Right 1 Implanted       PCB00 +14.5   ULTRASOUND TIME: 0 minutes 53 seconds.  CDE 5.77   SURGEON:  Benay Pillow, MD, MPH  ANESTHESIOLOGIST: Anesthesiologist: Veda Canning, MD CRNA: Cameron Ali, CRNA   ANESTHESIA:  Topical with tetracaine drops augmented with 1% preservative-free intracameral lidocaine.  ESTIMATED BLOOD LOSS: less than 1 mL.   COMPLICATIONS:  None.   DESCRIPTION OF PROCEDURE:  The patient was identified in the holding room and transported to the operating room and placed in the supine position under the operating microscope.  The right eye was identified as the operative eye and it was prepped and draped in the usual sterile ophthalmic fashion.   A 1.0 millimeter clear-corneal paracentesis was made at the 10:30 position. 0.5 ml of preservative-free 1% lidocaine with epinephrine was injected into the anterior chamber.  The anterior chamber was filled with Healon 5 viscoelastic.  A 2.4 millimeter keratome was used to make a near-clear corneal incision at the 8:00 position.  A curvilinear capsulorrhexis was made with a cystotome and capsulorrhexis forceps.  Balanced salt solution was used to hydrodissect and hydrodelineate the nucleus.   Phacoemulsification was then used in stop and chop fashion to remove the lens nucleus and epinucleus.  The remaining cortex was then removed using the irrigation and aspiration handpiece. Healon was then placed into the capsular bag to distend it for lens placement.  A  lens was then injected into the capsular bag.  The remaining viscoelastic was aspirated.   Wounds were hydrated with balanced salt solution.  The anterior chamber was inflated to a physiologic pressure with balanced salt solution.   Intracameral vigamox 0.1 mL undiluted was injected into the eye and a drop placed onto the ocular surface.  No wound leaks were noted.  The patient was taken to the recovery room in stable condition without complications of anesthesia or surgery  Benay Pillow 04/22/2019, 9:54 AM

## 2019-04-23 ENCOUNTER — Encounter: Payer: Self-pay | Admitting: Ophthalmology

## 2019-05-16 ENCOUNTER — Other Ambulatory Visit: Payer: Self-pay

## 2019-05-23 ENCOUNTER — Other Ambulatory Visit
Admission: RE | Admit: 2019-05-23 | Discharge: 2019-05-23 | Disposition: A | Discharge status: Home / Self Care | Payer: Medicare HMO | Source: Ambulatory Visit | Attending: Ophthalmology | Admitting: Ophthalmology

## 2019-05-23 ENCOUNTER — Other Ambulatory Visit: Payer: Self-pay

## 2019-05-23 DIAGNOSIS — Z1159 Encounter for screening for other viral diseases: Secondary | ICD-10-CM | POA: Diagnosis present

## 2019-05-23 LAB — SARS CORONAVIRUS 2 (TAT 6-24 HRS): SARS Coronavirus 2: NEGATIVE

## 2019-05-23 NOTE — Discharge Instructions (Signed)

## 2019-05-27 ENCOUNTER — Ambulatory Visit: Payer: Medicare HMO | Admitting: Anesthesiology

## 2019-05-27 ENCOUNTER — Encounter
Admission: RE | Disposition: A | Discharge status: Home / Self Care | Payer: Self-pay | Source: Home / Self Care | Attending: Ophthalmology

## 2019-05-27 ENCOUNTER — Ambulatory Visit
Admission: RE | Admit: 2019-05-27 | Discharge: 2019-05-27 | Disposition: A | Discharge status: Home / Self Care | Payer: Medicare HMO | Attending: Ophthalmology | Admitting: Ophthalmology

## 2019-05-27 DIAGNOSIS — D649 Anemia, unspecified: Secondary | ICD-10-CM | POA: Insufficient documentation

## 2019-05-27 DIAGNOSIS — Z96643 Presence of artificial hip joint, bilateral: Secondary | ICD-10-CM | POA: Insufficient documentation

## 2019-05-27 DIAGNOSIS — J45909 Unspecified asthma, uncomplicated: Secondary | ICD-10-CM | POA: Insufficient documentation

## 2019-05-27 DIAGNOSIS — I4891 Unspecified atrial fibrillation: Secondary | ICD-10-CM | POA: Diagnosis not present

## 2019-05-27 DIAGNOSIS — Z7951 Long term (current) use of inhaled steroids: Secondary | ICD-10-CM | POA: Diagnosis not present

## 2019-05-27 DIAGNOSIS — Z96652 Presence of left artificial knee joint: Secondary | ICD-10-CM | POA: Diagnosis not present

## 2019-05-27 DIAGNOSIS — H2512 Age-related nuclear cataract, left eye: Secondary | ICD-10-CM | POA: Insufficient documentation

## 2019-05-27 DIAGNOSIS — Z7901 Long term (current) use of anticoagulants: Secondary | ICD-10-CM | POA: Insufficient documentation

## 2019-05-27 DIAGNOSIS — Z79899 Other long term (current) drug therapy: Secondary | ICD-10-CM | POA: Diagnosis not present

## 2019-05-27 DIAGNOSIS — K219 Gastro-esophageal reflux disease without esophagitis: Secondary | ICD-10-CM | POA: Insufficient documentation

## 2019-05-27 DIAGNOSIS — I10 Essential (primary) hypertension: Secondary | ICD-10-CM | POA: Diagnosis not present

## 2019-05-27 DIAGNOSIS — M1990 Unspecified osteoarthritis, unspecified site: Secondary | ICD-10-CM | POA: Diagnosis not present

## 2019-05-27 HISTORY — DX: Unspecified disorder of nose and nasal sinuses: J34.9

## 2019-05-27 HISTORY — PX: CATARACT EXTRACTION W/PHACO: SHX586

## 2019-05-27 SURGERY — PHACOEMULSIFICATION, CATARACT, WITH IOL INSERTION
Anesthesia: Monitor Anesthesia Care | Site: Eye | Laterality: Left

## 2019-05-27 MED ORDER — SODIUM HYALURONATE 23 MG/ML IO SOLN
INTRAOCULAR | Status: DC | PRN
  Administered 2019-05-27: 0.6 mL via INTRAOCULAR

## 2019-05-27 MED ORDER — TETRACAINE HCL 0.5 % OP SOLN
1.0000 [drp] | OPHTHALMIC | Status: DC | PRN
  Administered 2019-05-27 (×3): 1 [drp] via OPHTHALMIC

## 2019-05-27 MED ORDER — LIDOCAINE HCL (PF) 2 % IJ SOLN
INTRAOCULAR | Status: DC | PRN
  Administered 2019-05-27: 1 mL via INTRAOCULAR

## 2019-05-27 MED ORDER — SODIUM HYALURONATE 10 MG/ML IO SOLN
INTRAOCULAR | Status: DC | PRN
  Administered 2019-05-27: 0.55 mL via INTRAOCULAR

## 2019-05-27 MED ORDER — FENTANYL CITRATE (PF) 100 MCG/2ML IJ SOLN
INTRAMUSCULAR | Status: DC | PRN
  Administered 2019-05-27: 50 ug via INTRAVENOUS

## 2019-05-27 MED ORDER — MIDAZOLAM HCL 2 MG/2ML IJ SOLN
INTRAMUSCULAR | Status: DC | PRN
  Administered 2019-05-27 (×2): 1 mg via INTRAVENOUS

## 2019-05-27 MED ORDER — EPINEPHRINE PF 1 MG/ML IJ SOLN
INTRAOCULAR | Status: DC | PRN
  Administered 2019-05-27: 108 mL via OPHTHALMIC

## 2019-05-27 MED ORDER — MOXIFLOXACIN HCL 0.5 % OP SOLN
OPHTHALMIC | Status: DC | PRN
  Administered 2019-05-27: 0.2 mL via OPHTHALMIC

## 2019-05-27 MED ORDER — ARMC OPHTHALMIC DILATING DROPS
1.0000 "application " | OPHTHALMIC | Status: DC | PRN
  Administered 2019-05-27 (×3): 1 via OPHTHALMIC

## 2019-05-27 SURGICAL SUPPLY — 19 items
CANNULA ANT/CHMB 27G (MISCELLANEOUS) ×2 IMPLANT
CANNULA ANT/CHMB 27GA (MISCELLANEOUS) ×6 IMPLANT
DISSECTOR HYDRO NUCLEUS 50X22 (MISCELLANEOUS) ×3 IMPLANT
GLOVE SURG LX 7.5 STRW (GLOVE) ×2
GLOVE SURG LX STRL 7.5 STRW (GLOVE) ×1 IMPLANT
GLOVE SURG SYN 8.5  E (GLOVE) ×2
GLOVE SURG SYN 8.5 E (GLOVE) ×1 IMPLANT
GLOVE SURG SYN 8.5 PF PI (GLOVE) ×1 IMPLANT
GOWN STRL REUS W/ TWL LRG LVL3 (GOWN DISPOSABLE) ×2 IMPLANT
GOWN STRL REUS W/TWL LRG LVL3 (GOWN DISPOSABLE) ×4
LENS IOL TECNIS ITEC 13.5 (Intraocular Lens) ×2 IMPLANT
MARKER SKIN DUAL TIP RULER LAB (MISCELLANEOUS) ×3 IMPLANT
PACK DR. KING ARMS (PACKS) ×3 IMPLANT
PACK EYE AFTER SURG (MISCELLANEOUS) ×3 IMPLANT
PACK OPTHALMIC (MISCELLANEOUS) ×3 IMPLANT
SYR 3ML LL SCALE MARK (SYRINGE) ×3 IMPLANT
SYR TB 1ML LUER SLIP (SYRINGE) ×3 IMPLANT
WATER STERILE IRR 250ML POUR (IV SOLUTION) ×3 IMPLANT
WIPE NON LINTING 3.25X3.25 (MISCELLANEOUS) ×3 IMPLANT

## 2019-05-27 NOTE — Op Note (Signed)
OPERATIVE NOTE  Mary Irwin [id number] 05/27/2019   PREOPERATIVE DIAGNOSIS:  Nuclear sclerotic cataract left eye.  H25.12   POSTOPERATIVE DIAGNOSIS:    Nuclear sclerotic cataract left eye.     PROCEDURE:  Phacoemusification with posterior chamber intraocular lens placement of the left eye   LENS:   Implant Name Type Inv. Item Serial No. Manufacturer Lot No. LRB No. Used Action  LENS IOL DIOP 13.5 - [id number] Intraocular Lens LENS IOL DIOP 13.5 [id number] AMO  Left 1 Implanted       PCB00 +13.5   ULTRASOUND TIME: 1 minutes 01 seconds.  CDE 8.71   SURGEON:  Benay Pillow, MD, MPH   ANESTHESIA:  Topical with tetracaine drops augmented with 1% preservative-free intracameral lidocaine.  ESTIMATED BLOOD LOSS: <1 mL   COMPLICATIONS:  None.   DESCRIPTION OF PROCEDURE:  The patient was identified in the holding room and transported to the operating room and placed in the supine position under the operating microscope.  The left eye was identified as the operative eye and it was prepped and draped in the usual sterile ophthalmic fashion.   A 1.0 millimeter clear-corneal paracentesis was made at the 5:00 position. 0.5 ml of preservative-free 1% lidocaine with epinephrine was injected into the anterior chamber.  The anterior chamber was filled with Healon 5 viscoelastic.  A 2.4 millimeter keratome was used to make a near-clear corneal incision at the 2:00 position.  A curvilinear capsulorrhexis was made with a cystotome and capsulorrhexis forceps.  Balanced salt solution was used to hydrodissect and hydrodelineate the nucleus.   Phacoemulsification was then used in stop and chop fashion to remove the lens nucleus and epinucleus.  The remaining cortex was then removed using the irrigation and aspiration handpiece. Healon was then placed into the capsular bag to distend it for lens placement.  A lens was then injected into the capsular bag.  The remaining viscoelastic was aspirated.   Wounds  were hydrated with balanced salt solution.  The anterior chamber was inflated to a physiologic pressure with balanced salt solution.  Intracameral vigamox 0.1 mL undiltued was injected into the eye and a drop placed onto the ocular surface.  No wound leaks were noted.  The patient was taken to the recovery room in stable condition without complications of anesthesia or surgery  Benay Pillow 05/27/2019, 8:00 AM

## 2019-05-27 NOTE — Anesthesia Procedure Notes (Signed)
Procedure Name: MAC Date/Time: 05/27/2019 7:35 AM Performed by: Cameron Ali, CRNA Pre-anesthesia Checklist: Patient identified, Emergency Drugs available, Suction available, Timeout performed and Patient being monitored Patient Re-evaluated:Patient Re-evaluated prior to induction Oxygen Delivery Method: Nasal cannula Placement Confirmation: positive ETCO2

## 2019-05-27 NOTE — Transfer of Care (Signed)
Immediate Anesthesia Transfer of Care Note  Patient: Mary Irwin  Procedure(s) Performed: CATARACT EXTRACTION PHACO AND INTRAOCULAR LENS PLACEMENT (IOC)  LEFT (Left Eye)  Patient Location: PACU  Anesthesia Type: MAC  Level of Consciousness: awake, alert  and patient cooperative  Airway and Oxygen Therapy: Patient Spontanous Breathing and Patient connected to supplemental oxygen  Post-op Assessment: Post-op Vital signs reviewed, Patient's Cardiovascular Status Stable, Respiratory Function Stable, Patent Airway and No signs of Nausea or vomiting  Post-op Vital Signs: Reviewed and stable  Complications: No apparent anesthesia complications

## 2019-05-27 NOTE — Anesthesia Preprocedure Evaluation (Signed)
Anesthesia Evaluation  Patient identified by MRN, date of birth, ID band Patient awake    History of Anesthesia Complications (+) PONV  Airway Mallampati: III  TM Distance: >3 FB Neck ROM: Full    Dental no notable dental hx.    Pulmonary asthma ,    Pulmonary exam normal        Cardiovascular hypertension, + dysrhythmias Atrial Fibrillation  Rhythm:Irregular Rate:Normal     Neuro/Psych    GI/Hepatic negative GI ROS, Neg liver ROS,   Endo/Other  negative endocrine ROS  Renal/GU negative Renal ROS     Musculoskeletal   Abdominal   Peds  Hematology negative hematology ROS (+)   Anesthesia Other Findings   Reproductive/Obstetrics negative OB ROS                            Anesthesia Physical Anesthesia Plan  ASA: II  Anesthesia Plan: MAC   Post-op Pain Management:    Induction: Intravenous  PONV Risk Score and Plan:   Airway Management Planned:   Additional Equipment:   Intra-op Plan:   Post-operative Plan:   Informed Consent: I have reviewed the patients History and Physical, chart, labs and discussed the procedure including the risks, benefits and alternatives for the proposed anesthesia with the patient or authorized representative who has indicated his/her understanding and acceptance.       Plan Discussed with:   Anesthesia Plan Comments:         Anesthesia Quick Evaluation

## 2019-05-27 NOTE — H&P (Signed)

## 2019-05-27 NOTE — Anesthesia Postprocedure Evaluation (Signed)
Anesthesia Post Note  Patient: Mary Irwin  Procedure(s) Performed: CATARACT EXTRACTION PHACO AND INTRAOCULAR LENS PLACEMENT (IOC)  LEFT (Left Eye)  Patient location during evaluation: PACU Anesthesia Type: MAC Level of consciousness: awake Pain management: pain level controlled Vital Signs Assessment: post-procedure vital signs reviewed and stable Respiratory status: spontaneous breathing Cardiovascular status: blood pressure returned to baseline Postop Assessment: no backache, no headache, adequate PO intake, able to ambulate and no apparent nausea or vomiting Anesthetic complications: no    Adele Barthel Delontae Lamm

## 2019-05-28 ENCOUNTER — Encounter: Payer: Self-pay | Admitting: Ophthalmology

## 2020-01-03 ENCOUNTER — Emergency Department: Payer: Medicare HMO

## 2020-01-03 ENCOUNTER — Other Ambulatory Visit: Payer: Self-pay

## 2020-01-03 ENCOUNTER — Emergency Department
Admission: EM | Admit: 2020-01-03 | Discharge: 2020-01-03 | Disposition: A | Discharge status: Home / Self Care | Payer: Medicare HMO | Attending: Emergency Medicine | Admitting: Emergency Medicine

## 2020-01-03 ENCOUNTER — Encounter: Payer: Self-pay | Admitting: Emergency Medicine

## 2020-01-03 DIAGNOSIS — J45909 Unspecified asthma, uncomplicated: Secondary | ICD-10-CM | POA: Diagnosis not present

## 2020-01-03 DIAGNOSIS — R1030 Lower abdominal pain, unspecified: Secondary | ICD-10-CM | POA: Diagnosis present

## 2020-01-03 DIAGNOSIS — E039 Hypothyroidism, unspecified: Secondary | ICD-10-CM | POA: Diagnosis not present

## 2020-01-03 DIAGNOSIS — Z96642 Presence of left artificial hip joint: Secondary | ICD-10-CM | POA: Diagnosis not present

## 2020-01-03 DIAGNOSIS — I10 Essential (primary) hypertension: Secondary | ICD-10-CM | POA: Insufficient documentation

## 2020-01-03 DIAGNOSIS — R509 Fever, unspecified: Secondary | ICD-10-CM | POA: Insufficient documentation

## 2020-01-03 DIAGNOSIS — Z7901 Long term (current) use of anticoagulants: Secondary | ICD-10-CM | POA: Diagnosis not present

## 2020-01-03 DIAGNOSIS — Z96641 Presence of right artificial hip joint: Secondary | ICD-10-CM | POA: Diagnosis not present

## 2020-01-03 DIAGNOSIS — R109 Unspecified abdominal pain: Secondary | ICD-10-CM

## 2020-01-03 DIAGNOSIS — Z79899 Other long term (current) drug therapy: Secondary | ICD-10-CM | POA: Diagnosis not present

## 2020-01-03 DIAGNOSIS — Z96652 Presence of left artificial knee joint: Secondary | ICD-10-CM | POA: Insufficient documentation

## 2020-01-03 LAB — COMPREHENSIVE METABOLIC PANEL
ALT: 20 U/L (ref 0–44)
AST: 21 U/L (ref 15–41)
Albumin: 4.2 g/dL (ref 3.5–5.0)
Alkaline Phosphatase: 92 U/L (ref 38–126)
Anion gap: 10 (ref 5–15)
BUN: 15 mg/dL (ref 8–23)
CO2: 25 mmol/L (ref 22–32)
Calcium: 9.4 mg/dL (ref 8.9–10.3)
Chloride: 105 mmol/L (ref 98–111)
Creatinine, Ser: 0.9 mg/dL (ref 0.44–1.00)
GFR calc Af Amer: >60 mL/min (ref >60)
GFR calc non Af Amer: >60 mL/min (ref >60)
Glucose, Bld: 92 mg/dL (ref 70–99)
Potassium: 4.1 mmol/L (ref 3.5–5.1)
Sodium: 140 mmol/L (ref 135–145)
Total Bilirubin: 0.5 mg/dL (ref 0.3–1.2)
Total Protein: 7.5 g/dL (ref 6.5–8.1)

## 2020-01-03 LAB — CBC
HCT: 39.2 % (ref 36.0–46.0)
Hemoglobin: 12.1 g/dL (ref 12.0–15.0)
MCH: 27.9 pg (ref 26.0–34.0)
MCHC: 30.9 g/dL (ref 30.0–36.0)
MCV: 90.5 fL (ref 80.0–100.0)
Platelets: 349 10*3/uL (ref 150–400)
RBC: 4.33 MIL/uL (ref 3.87–5.11)
RDW: 13.3 % (ref 11.5–15.5)
WBC: 9.9 10*3/uL (ref 4.0–10.5)
nRBC: 0 % (ref 0.0–0.2)

## 2020-01-03 LAB — URINALYSIS, COMPLETE (UACMP) WITH MICROSCOPIC
Bacteria, UA: NONE SEEN
Bilirubin Urine: NEGATIVE
Glucose, UA: NEGATIVE mg/dL
Hgb urine dipstick: NEGATIVE
Ketones, ur: NEGATIVE mg/dL
Nitrite: NEGATIVE
Protein, ur: NEGATIVE mg/dL
Specific Gravity, Urine: 1.015 (ref 1.005–1.030)
pH: 7 (ref 5.0–8.0)

## 2020-01-03 LAB — LIPASE, BLOOD: Lipase: 23 U/L (ref 11–51)

## 2020-01-03 MED ORDER — IOHEXOL 300 MG/ML  SOLN
100.0000 mL | Freq: Once | INTRAMUSCULAR | Status: AC | PRN
  Administered 2020-01-03: 19:00:00 100 mL via INTRAVENOUS
  Filled 2020-01-03: qty 100

## 2020-01-03 NOTE — ED Notes (Signed)
Patient transported to CT 

## 2020-01-03 NOTE — ED Triage Notes (Signed)
Pt here for lower abdominal pain across whole lower abdomen that radiates around to both sides of back.  PCP sent for r/o appendicitis vs diverticulitis.  Pt no urinary sx per pt.  No blood in stool.  On blood thinners.  No fever at home, low grade here.  Ambulatory, NAD. VSS

## 2020-01-03 NOTE — ED Provider Notes (Signed)
Emergency Department Provider Note  ____________________________________________  Time seen: Approximately 6:51 PM  I have reviewed the triage vital signs and the nursing notes.   HISTORY  Chief Complaint Abdominal Pain   Historian Patient     HPI Mary Irwin is a 69 y.o. female with a history of fibromyalgia, IBS and GERD, presents to the emergency department with suprapubic abdominal pain that seems to radiate to the back.  Patient states that she has chronic abdominal discomfort but states that her current pain is worse than usual.  Her pain started on Wednesday and seems to be constant in nature.  She has not had any emesis or diarrhea.  She reports a low-grade fever at home.  She has had prior cholecystectomy but still has her appendix.  No abdominal traumas.  She denies dysuria, hematuria or increased urinary frequency.  No other alleviating measures have been attempted.   Past Medical History:  Diagnosis Date  . Anemia    on iron supplements  . Arthritis    all over  . Asthma    once a week  . Atrial fibrillation ([place])   . Dysrhythmia    Atrial Fibrillation  . Fibromyalgia   . GERD (gastroesophageal reflux disease)   . Hypertension   . Hypothyroidism    no meds  . IBS (irritable bowel syndrome)   . PONV (postoperative nausea and vomiting)   . Sinus trouble      Immunizations up to date:  Yes.     Past Medical History:  Diagnosis Date  . Anemia    on iron supplements  . Arthritis    all over  . Asthma    once a week  . Atrial fibrillation ([place])   . Dysrhythmia    Atrial Fibrillation  . Fibromyalgia   . GERD (gastroesophageal reflux disease)   . Hypertension   . Hypothyroidism    no meds  . IBS (irritable bowel syndrome)   . PONV (postoperative nausea and vomiting)   . Sinus trouble     Patient Active Problem List   Diagnosis Date Noted  . Cholecystitis 09/20/2017  . Anemia 06/01/2017  . Asthma without status asthmaticus 10/25/2016   . Essential hypertension 10/25/2016  . Hyperlipemia 10/25/2016  . Thyroid disease 10/25/2016  . Chronic seasonal allergic rhinitis due to pollen 10/11/2016  . Fibromyalgia 10/11/2016  . GERD without esophagitis 10/11/2016  . Paroxysmal atrial fibrillation ([place]) 10/11/2016  . Psychophysiological insomnia 10/11/2016  . Recurrent major depressive disorder, in full remission ([place]) 10/11/2016    Past Surgical History:  Procedure Laterality Date  . BACK SURGERY    . CATARACT EXTRACTION W/PHACO Right 04/22/2019   Procedure: CATARACT EXTRACTION PHACO AND INTRAOCULAR LENS PLACEMENT ([place]) RIGHT;  Surgeon: Eulogio Bear, MD;  Location: [place];  Service: Ophthalmology;  Laterality: Right;  . CATARACT EXTRACTION W/PHACO Left 05/27/2019   Procedure: CATARACT EXTRACTION PHACO AND INTRAOCULAR LENS PLACEMENT ([place])  LEFT;  Surgeon: Eulogio Bear, MD;  Location: [place];  Service: Ophthalmology;  Laterality: Left;  . CHOLECYSTECTOMY N/A 09/22/2017   Procedure: LAPAROSCOPIC CHOLECYSTECTOMY WITH INTRAOPERATIVE CHOLANGIOGRAM;  Surgeon: Olean Ree, MD;  Location: [facility];  Service: General;  Laterality: N/A;  . COLONOSCOPY WITH PROPOFOL N/A 05/23/2017   Procedure: COLONOSCOPY WITH PROPOFOL;  Surgeon: Lollie Sails, MD;  Location: [facility] END[facility]COPY;  Service: Endoscopy;  Laterality: N/A;  . ESOPHAGOGASTRODUODENOSCOPY (EGD) WITH PROPOFOL N/A 05/23/2017   Procedure: ESOPHAGOGASTRODUODENOSCOPY (EGD) WITH PROPOFOL;  Surgeon: Lollie Sails, MD;  Location: [facility] ENDOSCOPY;  Service: Endoscopy;  Laterality: N/A;  . ESOPHAGOGASTRODUODENOSCOPY (EGD) WITH PROPOFOL N/A 07/31/2017   Procedure: ESOPHAGOGASTRODUODENOSCOPY (EGD) WITH PROPOFOL;  Surgeon: Lollie Sails, MD;  Location: [facility] ENDOSCOPY;  Service: Endoscopy;  Laterality: N/A;  . HERNIA REPAIR     umbilical  . 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  . JOINT REPLACEMENT Bilateral    total hip replacement     Prior to Admission medications   Medication Sig Start Date End Date Taking? Authorizing Provider  albuterol (PROVENTIL HFA;VENTOLIN HFA) 108 (90 Base) MCG/ACT inhaler Inhale 2 puffs into the lungs every 6 (six) hours as needed for wheezing or shortness of breath.    [provider]  apixaban (ELIQUIS) 5 MG TABS tablet Take 5 mg by mouth 2 (two) times daily. Am and bedtime    [provider]  dicyclomine (BENTYL) 10 MG capsule Take 10 mg by mouth as needed for spasms.    [provider]  diltiazem (CARDIZEM CD) 120 MG 24 hr capsule Take 300 mg by mouth daily. am    [provider]  ferrous gluconate (FERGON) 240 (27 FE) MG tablet Take 240 mg by mouth daily. bedtime    [provider]  fluticasone furoate-vilanterol (BREO ELLIPTA) 200-25 MCG/INH AEPB Inhale 1 puff into the lungs daily. am    [provider]  hydrochlorothiazide (HYDRODIURIL) 25 MG tablet Take 25 mg by mouth daily. am    [provider]  metoprolol tartrate (LOPRESSOR) 25 MG tablet Take 25 mg by mouth daily at 10 pm.    [provider]  pantoprazole (PROTONIX) 40 MG tablet Take 40 mg by mouth 2 (two) times daily. Am and bedtime 10/03/17   [provider]    Allergies Patient has no known allergies.  Family History  Problem Relation Age of Onset  . Breast cancer Maternal Aunt 48  . Hypertension Mother     Social History Social History   Tobacco Use  . Smoking status: Never Smoker  . Smokeless tobacco: Never Used  Substance Use Topics  . Alcohol use: No  . Drug use: No     Review of Systems  Constitutional: No fever/chills Eyes:  No discharge ENT: No upper respiratory complaints. Respiratory: no cough. No SOB/ use of accessory muscles to breath Gastrointestinal:   No nausea, no vomiting.  No diarrhea.  No constipation. Genitourinary: Patient has suprapubic pain to palpation.  Musculoskeletal: Negative for musculoskeletal  pain. Skin: Negative for rash, abrasions, lacerations, ecchymosis.    ____________________________________________   PHYSICAL EXAM:  VITAL SIGNS: ED Triage Vitals  Enc Vitals Group     BP 01/03/20 1548 (!) 134/101     Pulse Rate 01/03/20 1548 93     Resp 01/03/20 1548 18     Temp 01/03/20 1548 99.7 F (37.6 C)     Temp Source 01/03/20 1548 Oral     SpO2 01/03/20 1548 98 %     Weight 01/03/20 1548 200 lb (90.7 kg)     Height 01/03/20 1548 5\' 4"  (1.626 m)     Head Circumference --      Peak Flow --      Pain Score 01/03/20 1546 5     Pain Loc --      Pain Edu? --      Excl. in [place]? --      Constitutional: Alert and oriented. Well appearing and in no acute distress. Eyes: Conjunctivae are normal. PERRL. EOMI. Head:  Atraumatic. Cardiovascular: Normal rate, regular rhythm. Normal S1 and S2.  Good peripheral circulation. Respiratory: Normal respiratory effort without tachypnea or retractions. Lungs CTAB. Good air entry to the bases with no decreased or absent breath sounds Gastrointestinal: Bowel sounds x 4 quadrants.  Patient has suprapubic and right lower quadrant abdominal tenderness to palpation with guarding. No distention. Musculoskeletal: Full range of motion to all extremities. No obvious deformities noted Neurologic:  Normal for age. No gross focal neurologic deficits are appreciated.  Skin:  Skin is warm, dry and intact. No rash noted. Psychiatric: Mood and affect are normal for age. Speech and behavior are normal.   ____________________________________________   LABS (all labs ordered are listed, but only abnormal results are displayed)  Labs Reviewed  URINALYSIS, COMPLETE (UACMP) WITH MICROSCOPIC - Abnormal; Notable for the following components:      Result Value   Color, Urine YELLOW (*)    APPearance HAZY (*)    Leukocytes,Ua TRACE (*)    All other components within normal limits  LIPASE, BLOOD  COMPREHENSIVE METABOLIC PANEL  CBC    ____________________________________________  EKG   ____________________________________________  RADIOLOGY Unk Pinto, personally viewed and evaluated these images (plain radiographs) as part of my medical decision making, as well as reviewing the written report by the radiologist.    CT ABDOMEN PELVIS W CONTRAST  Result Date: 01/03/2020 CLINICAL DATA:  Abdominal abscess, abdominal pain EXAM: CT ABDOMEN AND PELVIS WITH CONTRAST TECHNIQUE: Multidetector CT imaging of the abdomen and pelvis was performed using the standard protocol following bolus administration of intravenous contrast. CONTRAST:  166mL OMNIPAQUE IOHEXOL 300 MG/ML  SOLN COMPARISON:  None. FINDINGS: Lower chest: No acute abnormality. Hepatobiliary: Diffuse low attenuation of the liver as can be seen with hepatic steatosis. No focal hepatic mass. Prior cholecystectomy. Pancreas: Unremarkable. No pancreatic ductal dilatation or surrounding inflammatory changes. Spleen: Normal in size without focal abnormality. 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 Normal left adrenal gland. 12 mm hypodense, fluid attenuating left interpolar renal mass most consistent with a cyst. No urolithiasis or obstructive uropathy. Stomach/Bowel: Stomach is within normal limits. Appendix appears normal. No evidence of bowel wall thickening, distention, or inflammatory changes. Moderate amount of stool in the transverse colon. Diverticulosis without evidence of diverticulitis. Vascular/Lymphatic: Aortic atherosclerosis. No enlarged abdominal or pelvic lymph nodes. Reproductive: Uterus and bilateral adnexa are unremarkable. Other: No abdominal wall hernia or abnormality. No abdominopelvic ascites. Musculoskeletal: No acute osseous abnormality. No aggressive osseous lesion. Bilateral hip arthroplasties. Degenerative disease with disc height loss at L4-5 and L5-S1 bilateral facet arthropathy.  IMPRESSION: 1. No acute abdominal or pelvic pathology. 2. Normal appendix. 3. Diverticulosis without evidence of diverticulitis. 4.  Aortic Atherosclerosis (ICD10-I70.0). Electronically Signed   By: Kathreen Devoid   On: 01/03/2020 19:31    ____________________________________________    PROCEDURES  Procedure(s) performed:     Procedures     Medications  iohexol (OMNIPAQUE) 300 MG/ML solution 100 mL (100 mLs Intravenous Contrast Given 01/03/20 1916)     ____________________________________________   INITIAL IMPRESSION / ASSESSMENT AND PLAN / ED COURSE  Pertinent labs & imaging results that were available during my care of the patient were reviewed by me and considered in my medical decision making (see chart for details).      Assessment and Plan: Abdominal Pain:  69 year old female presents to the emergency department with suprapubic and right lower quadrant abdominal pain that is occurred for the past 2 days.  Vital  signs were reassuring at triage.  On physical exam, patient had some suprapubic and right lower quadrant abdominal tenderness to palpation.  Differential diagnosis included diverticulitis, appendicitis, cystitis, intra-abdominal abscess, abdominal wall strain...  CBC was reassuring without leukocytosis.  No abnormalities noted on CMP.  Urinalysis was noncontributory for cystitis.  Lipase was within reference range.  CT abdomen and pelvis revealed no acute abnormality.  Patient states that her pain is currently well managed and does not need pain medication.  She was advised to follow-up with GI.  Return precautions were given to return with new or worsening symptoms.  All patient questions were answered. ____________________________________________  FINAL CLINICAL IMPRESSION(S) / ED DIAGNOSES  Final diagnoses:  Abdominal pain, unspecified abdominal location      NEW MEDICATIONS STARTED DURING THIS VISIT:  ED Discharge Orders    None           This chart was dictated using voice recognition software/Dragon. Despite best efforts to proofread, errors can occur which can change the meaning. Any change was purely unintentional.     Karren Cobble 01/03/20 2033    Drenda Freeze, MD 01/04/20 1357

## 2020-01-03 NOTE — ED Triage Notes (Signed)
First Nurse Note:  Arrives for ED evaluation of abdominal pain.  Patient sent by PCP for c/o RLQ pain x 2 days.  Patient is AAOx3.  Skin warm and dry. NAD

## 2020-01-22 ENCOUNTER — Other Ambulatory Visit: Payer: Self-pay

## 2020-01-22 ENCOUNTER — Ambulatory Visit
Admission: EM | Admit: 2020-01-22 | Discharge: 2020-01-22 | Disposition: A | Discharge status: Home / Self Care | Payer: Medicare HMO | Attending: Family Medicine | Admitting: Family Medicine

## 2020-01-22 ENCOUNTER — Encounter: Payer: Self-pay | Admitting: Emergency Medicine

## 2020-01-22 DIAGNOSIS — Z23 Encounter for immunization: Secondary | ICD-10-CM

## 2020-01-22 DIAGNOSIS — S91309A Unspecified open wound, unspecified foot, initial encounter: Secondary | ICD-10-CM

## 2020-01-22 DIAGNOSIS — W268XXA Contact with other sharp object(s), not elsewhere classified, initial encounter: Secondary | ICD-10-CM

## 2020-01-22 MED ORDER — TETANUS-DIPHTH-ACELL PERTUSSIS 5-2.5-18.5 LF-MCG/0.5 IM SUSP
0.5000 mL | Freq: Once | INTRAMUSCULAR | Status: AC
  Administered 2020-01-22: 0.5 mL via INTRAMUSCULAR

## 2020-01-22 MED ORDER — DOXYCYCLINE HYCLATE 100 MG PO CAPS: 100.0000 mg | ORAL_CAPSULE | Freq: Two times a day (BID) | ORAL | 0 refills | Status: DC

## 2020-01-22 MED ORDER — MUPIROCIN 2 % EX OINT: 1.0000 "application " | TOPICAL_OINTMENT | Freq: Two times a day (BID) | CUTANEOUS | 0 refills | Status: AC

## 2020-01-22 NOTE — ED Triage Notes (Signed)
Patient states the screen door closed on her right heel/ankle on Sunday. She states the area is sore but is having drainage at the site.

## 2020-01-22 NOTE — ED Provider Notes (Signed)
[facility]    CSN: [id number] Arrival date & time: 01/22/20  1508  History   Chief Complaint Chief Complaint  Patient presents with  . Foot Injury   HPI  69 year old female presents with the above complaint.  Patient reports that she injured the posterior aspect of her right heel on Sunday.  She states that the screen door slammed quickly and injured the back of her heel.  She has a wound.  She did not seek medical treatment until today.  She is having some mild redness around the site and mild drainage from the wound.  No documented fever.  Temperature mildly elevated here.  Area is still tender and is also swollen.  No relieving factors.  No other associated symptoms.  No other complaints.  Past Medical History:  Diagnosis Date  . Anemia    on iron supplements  . Arthritis    all over  . Asthma    once a week  . Atrial fibrillation ([place])   . Dysrhythmia    Atrial Fibrillation  . Fibromyalgia   . GERD (gastroesophageal reflux disease)   . Hypertension   . Hypothyroidism    no meds  . IBS (irritable bowel syndrome)   . PONV (postoperative nausea and vomiting)   . Sinus trouble     Patient Active Problem List   Diagnosis Date Noted  . Cholecystitis 09/20/2017  . Anemia 06/01/2017  . Asthma without status asthmaticus 10/25/2016  . Essential hypertension 10/25/2016  . Hyperlipemia 10/25/2016  . Thyroid disease 10/25/2016  . Chronic seasonal allergic rhinitis due to pollen 10/11/2016  . Fibromyalgia 10/11/2016  . GERD without esophagitis 10/11/2016  . Paroxysmal atrial fibrillation ([place]) 10/11/2016  . Psychophysiological insomnia 10/11/2016  . Recurrent major depressive disorder, in full remission ([place]) 10/11/2016    Past Surgical History:  Procedure Laterality Date  . BACK SURGERY    . CATARACT EXTRACTION W/PHACO Right 04/22/2019   Procedure: CATARACT EXTRACTION PHACO AND INTRAOCULAR LENS PLACEMENT ([place]) RIGHT;  Surgeon: Eulogio Bear, MD;   Location: [place];  Service: Ophthalmology;  Laterality: Right;  . CATARACT EXTRACTION W/PHACO Left 05/27/2019   Procedure: CATARACT EXTRACTION PHACO AND INTRAOCULAR LENS PLACEMENT ([place])  LEFT;  Surgeon: Eulogio Bear, MD;  Location: [place];  Service: Ophthalmology;  Laterality: Left;  . CHOLECYSTECTOMY N/A 09/22/2017   Procedure: LAPAROSCOPIC CHOLECYSTECTOMY WITH INTRAOPERATIVE CHOLANGIOGRAM;  Surgeon: Olean Ree, MD;  Location: [facility];  Service: General;  Laterality: N/A;  . COLONOSCOPY WITH PROPOFOL N/A 05/23/2017   Procedure: COLONOSCOPY WITH PROPOFOL;  Surgeon: Lollie Sails, MD;  Location: [facility] ENDOSCOPY;  Service: Endoscopy;  Laterality: N/A;  . ESOPHAGOGASTRODUODENOSCOPY (EGD) WITH PROPOFOL N/A 05/23/2017   Procedure: ESOPHAGOGASTRODUODENOSCOPY (EGD) WITH PROPOFOL;  Surgeon: Lollie Sails, MD;  Location: [facility] ENDOSCOPY;  Service: Endoscopy;  Laterality: N/A;  . ESOPHAGOGASTRODUODENOSCOPY (EGD) WITH PROPOFOL N/A 07/31/2017   Procedure: ESOPHAGOGASTRODUODENOSCOPY (EGD) WITH PROPOFOL;  Surgeon: Lollie Sails, MD;  Location: [facility] ENDOSCOPY;  Service: Endoscopy;  Laterality: N/A;  . HERNIA REPAIR     umbilical  . 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  . JOINT REPLACEMENT Bilateral    total hip replacement    OB History   No obstetric history on file.      Home Medications    Prior to Admission medications   Medication Sig Start Date End Date Taking? Authorizing Provider  albuterol (PROVENTIL HFA;VENTOLIN HFA) 108 (90 Base) MCG/ACT inhaler Inhale 2 puffs into the  lungs every 6 (six) hours as needed for wheezing or shortness of breath.   Yes [provider]  apixaban (ELIQUIS) 5 MG TABS tablet Take 5 mg by mouth 2 (two) times daily. Am and bedtime   Yes [provider]  dicyclomine (BENTYL) 10 MG capsule Take 10 mg by mouth as needed for spasms.   Yes [provider]  diltiazem (CARDIZEM CD) 120  MG 24 hr capsule Take 300 mg by mouth daily. am   Yes [provider]  ferrous gluconate (FERGON) 240 (27 FE) MG tablet Take 240 mg by mouth daily. bedtime   Yes [provider]  fluticasone furoate-vilanterol (BREO ELLIPTA) 200-25 MCG/INH AEPB Inhale 1 puff into the lungs daily. am   Yes [provider]  hydrochlorothiazide (HYDRODIURIL) 25 MG tablet Take 25 mg by mouth daily. am   Yes [provider]  metoprolol tartrate (LOPRESSOR) 25 MG tablet Take 25 mg by mouth daily at 10 pm.   Yes [provider]  pantoprazole (PROTONIX) 40 MG tablet Take 40 mg by mouth 2 (two) times daily. Am and bedtime 10/03/17  Yes [provider]  doxycycline (VIBRAMYCIN) 100 MG capsule Take 1 capsule (100 mg total) by mouth 2 (two) times daily. 01/22/20   Coral Spikes, DO  mupirocin ointment (BACTROBAN) 2 % Apply 1 application topically 2 (two) times daily for 7 days. 01/22/20 01/29/20  Coral Spikes, DO    Family History Family History  Problem Relation Age of Onset  . Breast cancer Maternal Aunt 80  . Hypertension Mother     Social History Social History   Tobacco Use  . Smoking status: Never Smoker  . Smokeless tobacco: Never Used  Substance Use Topics  . Alcohol use: No  . Drug use: No     Allergies   Patient has no known allergies.   Review of Systems Review of Systems  Constitutional: Negative.   Skin: Positive for wound.   Physical Exam Triage Vital Signs ED Triage Vitals  Enc Vitals Group     BP 01/22/20 1524 (!) 160/82     Pulse Rate 01/22/20 1524 (!) 109     Resp 01/22/20 1524 18     Temp 01/22/20 1524 99.3 F (37.4 C)     Temp Source 01/22/20 1524 Oral     SpO2 01/22/20 1524 98 %     Weight 01/22/20 1523 198 lb (89.8 kg)     Height 01/22/20 1523 5\' 4"  (1.626 m)     Head Circumference --      Peak Flow --      Pain Score 01/22/20 1522 3     Pain Loc --      Pain Edu? --      Excl. in [place]? --    Updated Vital Signs BP  (!) 160/82 (BP Location: Right Arm)   Pulse (!) 109   Temp 99.3 F (37.4 C) (Oral)   Resp 18   Ht 5\' 4"  (1.626 m)   Wt 89.8 kg   SpO2 98%   BMI 33.99 kg/m   Visual Acuity Right Eye Distance:   Left Eye Distance:   Bilateral Distance:    Right Eye Near:   Left Eye Near:    Bilateral Near:     Physical Exam Vitals and nursing note reviewed.  Constitutional:      General: She is not in acute distress.    Appearance: Normal appearance. She is not ill-appearing.  HENT:  Head: Normocephalic and atraumatic.  Eyes:     General:        Right eye: No discharge.        Left eye: No discharge.     Conjunctiva/sclera: Conjunctivae normal.  Cardiovascular:     Rate and Rhythm: Normal rate and regular rhythm.     Heart sounds: No murmur.  Pulmonary:     Effort: Pulmonary effort is normal.     Breath sounds: Normal breath sounds. No wheezing, rhonchi or rales.  Feet:     Comments: Posterior heel of the right foot with an approximate 4.5 cm curvilinear, healing wound.  Mild surrounding erythema.  No current purulent drainage. Neurological:     Mental Status: She is alert.  Psychiatric:        Mood and Affect: Mood normal.        Behavior: Behavior normal.    UC Treatments / Results  Labs (all labs ordered are listed, but only abnormal results are displayed) Labs Reviewed - No data to display  EKG   Radiology No results found.  Procedures Procedures (including critical care time)  Medications Ordered in UC Medications  Tdap (BOOSTRIX) injection 0.5 mL (0.5 mLs Intramuscular Given 01/22/20 1530)    Initial Impression / Assessment and Plan / UC Course  I have reviewed the triage vital signs and the nursing notes.  Pertinent labs & imaging results that were available during my care of the patient were reviewed by me and considered in my medical decision making (see chart for details).    69 year old female presents with a foot wound.  Tetanus given today.  Placed  on doxycycline and Bactroban ointment.  Advised to keep clean.  Supportive care.  Final Clinical Impressions(s) / UC Diagnoses   Final diagnoses:  Wound of foot     Discharge Instructions     Medications as prescribed.  Take care  Dr. Lacinda Axon    ED Prescriptions    Medication Sig Dispense Auth. Provider   doxycycline (VIBRAMYCIN) 100 MG capsule Take 1 capsule (100 mg total) by mouth 2 (two) times daily. 14 capsule Livianna Petraglia G, DO   mupirocin ointment (BACTROBAN) 2 % Apply 1 application topically 2 (two) times daily for 7 days. 22 g Coral Spikes, DO     PDMP not reviewed this encounter.   Coral Spikes, [place] 01/22/20 1646

## 2020-01-22 NOTE — Discharge Instructions (Signed)
Medications as prescribed. ° °Take care ° °Dr. Adelise Buswell  °

## 2020-08-26 ENCOUNTER — Other Ambulatory Visit: Payer: Self-pay | Admitting: Orthopedic Surgery

## 2020-08-26 DIAGNOSIS — M4807 Spinal stenosis, lumbosacral region: Secondary | ICD-10-CM

## 2020-08-26 DIAGNOSIS — M48062 Spinal stenosis, lumbar region with neurogenic claudication: Secondary | ICD-10-CM

## 2020-08-26 DIAGNOSIS — G8929 Other chronic pain: Secondary | ICD-10-CM

## 2020-08-31 ENCOUNTER — Other Ambulatory Visit: Payer: Self-pay

## 2020-08-31 ENCOUNTER — Ambulatory Visit
Admission: RE | Admit: 2020-08-31 | Discharge: 2020-08-31 | Disposition: A | Discharge status: Home / Self Care | Payer: Medicare HMO | Source: Ambulatory Visit | Attending: Orthopedic Surgery | Admitting: Orthopedic Surgery

## 2020-08-31 DIAGNOSIS — M5441 Lumbago with sciatica, right side: Secondary | ICD-10-CM | POA: Diagnosis present

## 2020-08-31 DIAGNOSIS — G8929 Other chronic pain: Secondary | ICD-10-CM | POA: Insufficient documentation

## 2020-08-31 DIAGNOSIS — M48062 Spinal stenosis, lumbar region with neurogenic claudication: Secondary | ICD-10-CM | POA: Diagnosis present

## 2020-08-31 DIAGNOSIS — M5442 Lumbago with sciatica, left side: Secondary | ICD-10-CM | POA: Diagnosis present

## 2020-08-31 DIAGNOSIS — M4807 Spinal stenosis, lumbosacral region: Secondary | ICD-10-CM | POA: Diagnosis present

## 2020-11-19 DIAGNOSIS — M5442 Lumbago with sciatica, left side: Secondary | ICD-10-CM | POA: Diagnosis not present

## 2020-11-19 DIAGNOSIS — M5441 Lumbago with sciatica, right side: Secondary | ICD-10-CM | POA: Diagnosis not present

## 2020-11-19 DIAGNOSIS — M6281 Muscle weakness (generalized): Secondary | ICD-10-CM | POA: Diagnosis not present

## 2020-11-19 DIAGNOSIS — G8929 Other chronic pain: Secondary | ICD-10-CM | POA: Diagnosis not present

## 2020-11-19 DIAGNOSIS — M48062 Spinal stenosis, lumbar region with neurogenic claudication: Secondary | ICD-10-CM | POA: Diagnosis not present

## 2020-11-25 DIAGNOSIS — G8929 Other chronic pain: Secondary | ICD-10-CM | POA: Diagnosis not present

## 2020-11-25 DIAGNOSIS — M6281 Muscle weakness (generalized): Secondary | ICD-10-CM | POA: Diagnosis not present

## 2020-11-25 DIAGNOSIS — M5442 Lumbago with sciatica, left side: Secondary | ICD-10-CM | POA: Diagnosis not present

## 2020-11-25 DIAGNOSIS — M48062 Spinal stenosis, lumbar region with neurogenic claudication: Secondary | ICD-10-CM | POA: Diagnosis not present

## 2020-11-25 DIAGNOSIS — M5441 Lumbago with sciatica, right side: Secondary | ICD-10-CM | POA: Diagnosis not present

## 2020-11-30 DIAGNOSIS — M48062 Spinal stenosis, lumbar region with neurogenic claudication: Secondary | ICD-10-CM | POA: Diagnosis not present

## 2020-11-30 DIAGNOSIS — G8929 Other chronic pain: Secondary | ICD-10-CM | POA: Diagnosis not present

## 2020-11-30 DIAGNOSIS — M5442 Lumbago with sciatica, left side: Secondary | ICD-10-CM | POA: Diagnosis not present

## 2020-11-30 DIAGNOSIS — M5441 Lumbago with sciatica, right side: Secondary | ICD-10-CM | POA: Diagnosis not present

## 2020-11-30 DIAGNOSIS — M6281 Muscle weakness (generalized): Secondary | ICD-10-CM | POA: Diagnosis not present

## 2020-12-03 DIAGNOSIS — M48062 Spinal stenosis, lumbar region with neurogenic claudication: Secondary | ICD-10-CM | POA: Diagnosis not present

## 2020-12-03 DIAGNOSIS — M6281 Muscle weakness (generalized): Secondary | ICD-10-CM | POA: Diagnosis not present

## 2020-12-03 DIAGNOSIS — M5442 Lumbago with sciatica, left side: Secondary | ICD-10-CM | POA: Diagnosis not present

## 2020-12-03 DIAGNOSIS — G8929 Other chronic pain: Secondary | ICD-10-CM | POA: Diagnosis not present

## 2020-12-03 DIAGNOSIS — M5441 Lumbago with sciatica, right side: Secondary | ICD-10-CM | POA: Diagnosis not present

## 2020-12-04 ENCOUNTER — Telehealth: Payer: Self-pay

## 2020-12-04 NOTE — Telephone Encounter (Signed)
Pt's mother is a pt here at [place] [facility] and she is interested in becoming a NEW Pt of Marnee Guarneri NP. Please advise.

## 2020-12-04 NOTE — Telephone Encounter (Signed)
That is okay, can schedule

## 2020-12-07 DIAGNOSIS — G8929 Other chronic pain: Secondary | ICD-10-CM | POA: Diagnosis not present

## 2020-12-07 DIAGNOSIS — M5441 Lumbago with sciatica, right side: Secondary | ICD-10-CM | POA: Diagnosis not present

## 2020-12-07 DIAGNOSIS — M5442 Lumbago with sciatica, left side: Secondary | ICD-10-CM | POA: Diagnosis not present

## 2020-12-07 DIAGNOSIS — M6281 Muscle weakness (generalized): Secondary | ICD-10-CM | POA: Diagnosis not present

## 2020-12-07 DIAGNOSIS — M48062 Spinal stenosis, lumbar region with neurogenic claudication: Secondary | ICD-10-CM | POA: Diagnosis not present

## 2020-12-09 DIAGNOSIS — Z0000 Encounter for general adult medical examination without abnormal findings: Secondary | ICD-10-CM | POA: Diagnosis not present

## 2020-12-09 DIAGNOSIS — Z1231 Encounter for screening mammogram for malignant neoplasm of breast: Secondary | ICD-10-CM | POA: Diagnosis not present

## 2020-12-10 NOTE — Telephone Encounter (Signed)
Unable to lvm to make apt °

## 2020-12-11 DIAGNOSIS — G8929 Other chronic pain: Secondary | ICD-10-CM | POA: Diagnosis not present

## 2020-12-11 DIAGNOSIS — M6281 Muscle weakness (generalized): Secondary | ICD-10-CM | POA: Diagnosis not present

## 2020-12-11 DIAGNOSIS — M5442 Lumbago with sciatica, left side: Secondary | ICD-10-CM | POA: Diagnosis not present

## 2020-12-11 DIAGNOSIS — M5441 Lumbago with sciatica, right side: Secondary | ICD-10-CM | POA: Diagnosis not present

## 2020-12-11 DIAGNOSIS — M48062 Spinal stenosis, lumbar region with neurogenic claudication: Secondary | ICD-10-CM | POA: Diagnosis not present

## 2020-12-14 DIAGNOSIS — M6281 Muscle weakness (generalized): Secondary | ICD-10-CM | POA: Diagnosis not present

## 2020-12-14 DIAGNOSIS — G8929 Other chronic pain: Secondary | ICD-10-CM | POA: Diagnosis not present

## 2020-12-14 DIAGNOSIS — M5442 Lumbago with sciatica, left side: Secondary | ICD-10-CM | POA: Diagnosis not present

## 2020-12-14 DIAGNOSIS — M48062 Spinal stenosis, lumbar region with neurogenic claudication: Secondary | ICD-10-CM | POA: Diagnosis not present

## 2020-12-14 DIAGNOSIS — M5441 Lumbago with sciatica, right side: Secondary | ICD-10-CM | POA: Diagnosis not present

## 2020-12-15 NOTE — Telephone Encounter (Signed)
Pt sated she would call to make this apt as she is unsure if she would like to actually switch providers at this time.

## 2020-12-17 DIAGNOSIS — M6281 Muscle weakness (generalized): Secondary | ICD-10-CM | POA: Diagnosis not present

## 2020-12-17 DIAGNOSIS — M5441 Lumbago with sciatica, right side: Secondary | ICD-10-CM | POA: Diagnosis not present

## 2020-12-17 DIAGNOSIS — M5442 Lumbago with sciatica, left side: Secondary | ICD-10-CM | POA: Diagnosis not present

## 2020-12-17 DIAGNOSIS — G8929 Other chronic pain: Secondary | ICD-10-CM | POA: Diagnosis not present

## 2020-12-17 DIAGNOSIS — M48062 Spinal stenosis, lumbar region with neurogenic claudication: Secondary | ICD-10-CM | POA: Diagnosis not present

## 2020-12-24 DIAGNOSIS — M48062 Spinal stenosis, lumbar region with neurogenic claudication: Secondary | ICD-10-CM | POA: Diagnosis not present

## 2020-12-24 DIAGNOSIS — M5441 Lumbago with sciatica, right side: Secondary | ICD-10-CM | POA: Diagnosis not present

## 2020-12-24 DIAGNOSIS — M5442 Lumbago with sciatica, left side: Secondary | ICD-10-CM | POA: Diagnosis not present

## 2020-12-24 DIAGNOSIS — G8929 Other chronic pain: Secondary | ICD-10-CM | POA: Diagnosis not present

## 2020-12-24 DIAGNOSIS — Z9889 Other specified postprocedural states: Secondary | ICD-10-CM | POA: Diagnosis not present

## 2020-12-31 DIAGNOSIS — M5441 Lumbago with sciatica, right side: Secondary | ICD-10-CM | POA: Diagnosis not present

## 2020-12-31 DIAGNOSIS — G8929 Other chronic pain: Secondary | ICD-10-CM | POA: Diagnosis not present

## 2020-12-31 DIAGNOSIS — M48062 Spinal stenosis, lumbar region with neurogenic claudication: Secondary | ICD-10-CM | POA: Diagnosis not present

## 2020-12-31 DIAGNOSIS — M5442 Lumbago with sciatica, left side: Secondary | ICD-10-CM | POA: Diagnosis not present

## 2021-01-07 DIAGNOSIS — G8929 Other chronic pain: Secondary | ICD-10-CM | POA: Diagnosis not present

## 2021-01-07 DIAGNOSIS — M5442 Lumbago with sciatica, left side: Secondary | ICD-10-CM | POA: Diagnosis not present

## 2021-01-07 DIAGNOSIS — M5441 Lumbago with sciatica, right side: Secondary | ICD-10-CM | POA: Diagnosis not present

## 2021-01-07 DIAGNOSIS — M48062 Spinal stenosis, lumbar region with neurogenic claudication: Secondary | ICD-10-CM | POA: Diagnosis not present

## 2021-01-21 DIAGNOSIS — M48062 Spinal stenosis, lumbar region with neurogenic claudication: Secondary | ICD-10-CM | POA: Diagnosis not present

## 2021-01-21 DIAGNOSIS — M5441 Lumbago with sciatica, right side: Secondary | ICD-10-CM | POA: Diagnosis not present

## 2021-01-21 DIAGNOSIS — M5442 Lumbago with sciatica, left side: Secondary | ICD-10-CM | POA: Diagnosis not present

## 2021-01-21 DIAGNOSIS — G8929 Other chronic pain: Secondary | ICD-10-CM | POA: Diagnosis not present

## 2021-01-25 DIAGNOSIS — M48062 Spinal stenosis, lumbar region with neurogenic claudication: Secondary | ICD-10-CM | POA: Diagnosis not present

## 2021-01-25 DIAGNOSIS — M5441 Lumbago with sciatica, right side: Secondary | ICD-10-CM | POA: Diagnosis not present

## 2021-01-25 DIAGNOSIS — M5442 Lumbago with sciatica, left side: Secondary | ICD-10-CM | POA: Diagnosis not present

## 2021-01-25 DIAGNOSIS — G8929 Other chronic pain: Secondary | ICD-10-CM | POA: Diagnosis not present

## 2021-02-11 DIAGNOSIS — M48062 Spinal stenosis, lumbar region with neurogenic claudication: Secondary | ICD-10-CM | POA: Diagnosis not present

## 2021-02-11 DIAGNOSIS — M47816 Spondylosis without myelopathy or radiculopathy, lumbar region: Secondary | ICD-10-CM | POA: Diagnosis not present

## 2021-02-11 DIAGNOSIS — M5441 Lumbago with sciatica, right side: Secondary | ICD-10-CM | POA: Diagnosis not present

## 2021-02-11 DIAGNOSIS — G8929 Other chronic pain: Secondary | ICD-10-CM | POA: Diagnosis not present

## 2021-02-11 DIAGNOSIS — M5442 Lumbago with sciatica, left side: Secondary | ICD-10-CM | POA: Diagnosis not present

## 2021-03-01 ENCOUNTER — Other Ambulatory Visit: Payer: Self-pay | Admitting: Family Medicine

## 2021-03-01 DIAGNOSIS — Z8781 Personal history of (healed) traumatic fracture: Secondary | ICD-10-CM

## 2021-03-01 DIAGNOSIS — Z780 Asymptomatic menopausal state: Secondary | ICD-10-CM

## 2021-03-04 DIAGNOSIS — M47816 Spondylosis without myelopathy or radiculopathy, lumbar region: Secondary | ICD-10-CM | POA: Diagnosis not present

## 2021-03-11 DIAGNOSIS — M48062 Spinal stenosis, lumbar region with neurogenic claudication: Secondary | ICD-10-CM | POA: Diagnosis not present

## 2021-03-11 DIAGNOSIS — G8929 Other chronic pain: Secondary | ICD-10-CM | POA: Diagnosis not present

## 2021-03-11 DIAGNOSIS — M47816 Spondylosis without myelopathy or radiculopathy, lumbar region: Secondary | ICD-10-CM | POA: Diagnosis not present

## 2021-03-11 DIAGNOSIS — M5441 Lumbago with sciatica, right side: Secondary | ICD-10-CM | POA: Diagnosis not present

## 2021-03-11 DIAGNOSIS — M5442 Lumbago with sciatica, left side: Secondary | ICD-10-CM | POA: Diagnosis not present

## 2021-03-23 ENCOUNTER — Telehealth: Payer: Self-pay

## 2021-03-23 NOTE — Telephone Encounter (Signed)
Error

## 2021-04-07 ENCOUNTER — Other Ambulatory Visit
Admission: RE | Admit: 2021-04-07 | Discharge: 2021-04-07 | Disposition: A | Discharge status: Home / Self Care | Payer: Medicare HMO | Source: Ambulatory Visit | Attending: Physician Assistant | Admitting: Physician Assistant

## 2021-04-07 DIAGNOSIS — R0602 Shortness of breath: Secondary | ICD-10-CM | POA: Diagnosis not present

## 2021-04-07 DIAGNOSIS — R6889 Other general symptoms and signs: Secondary | ICD-10-CM | POA: Diagnosis not present

## 2021-04-07 DIAGNOSIS — I493 Ventricular premature depolarization: Secondary | ICD-10-CM | POA: Diagnosis not present

## 2021-04-07 DIAGNOSIS — R609 Edema, unspecified: Secondary | ICD-10-CM | POA: Insufficient documentation

## 2021-04-07 DIAGNOSIS — I480 Paroxysmal atrial fibrillation: Secondary | ICD-10-CM | POA: Diagnosis not present

## 2021-04-07 DIAGNOSIS — R5383 Other fatigue: Secondary | ICD-10-CM | POA: Diagnosis not present

## 2021-04-07 DIAGNOSIS — Z79899 Other long term (current) drug therapy: Secondary | ICD-10-CM | POA: Diagnosis not present

## 2021-04-07 DIAGNOSIS — E559 Vitamin D deficiency, unspecified: Secondary | ICD-10-CM | POA: Diagnosis not present

## 2021-04-07 LAB — BRAIN NATRIURETIC PEPTIDE: B Natriuretic Peptide: 46.3 pg/mL (ref 0.0–100.0)

## 2021-04-11 ENCOUNTER — Encounter: Payer: Self-pay | Admitting: Nurse Practitioner

## 2021-04-11 DIAGNOSIS — D729 Disorder of white blood cells, unspecified: Secondary | ICD-10-CM | POA: Insufficient documentation

## 2021-04-11 DIAGNOSIS — I700 Atherosclerosis of aorta: Secondary | ICD-10-CM | POA: Insufficient documentation

## 2021-04-11 DIAGNOSIS — D6869 Other thrombophilia: Secondary | ICD-10-CM | POA: Insufficient documentation

## 2021-04-11 DIAGNOSIS — G8929 Other chronic pain: Secondary | ICD-10-CM | POA: Insufficient documentation

## 2021-04-11 DIAGNOSIS — E559 Vitamin D deficiency, unspecified: Secondary | ICD-10-CM | POA: Insufficient documentation

## 2021-04-11 DIAGNOSIS — E538 Deficiency of other specified B group vitamins: Secondary | ICD-10-CM | POA: Insufficient documentation

## 2021-04-16 ENCOUNTER — Encounter: Payer: Self-pay | Admitting: Nurse Practitioner

## 2021-04-16 ENCOUNTER — Encounter: Payer: Self-pay | Admitting: Emergency Medicine

## 2021-04-16 ENCOUNTER — Other Ambulatory Visit: Payer: Self-pay

## 2021-04-16 ENCOUNTER — Emergency Department
Admission: EM | Admit: 2021-04-16 | Discharge: 2021-04-17 | Disposition: A | Discharge status: Home / Self Care | Payer: Medicare HMO | Attending: Emergency Medicine | Admitting: Emergency Medicine

## 2021-04-16 ENCOUNTER — Ambulatory Visit (INDEPENDENT_AMBULATORY_CARE_PROVIDER_SITE_OTHER): Payer: Medicare HMO | Admitting: Nurse Practitioner

## 2021-04-16 VITALS — BP 128/70 | HR 97 | Temp 98.9°F | Ht 63.82 in | Wt 200.0 lb

## 2021-04-16 DIAGNOSIS — J4520 Mild intermittent asthma, uncomplicated: Secondary | ICD-10-CM

## 2021-04-16 DIAGNOSIS — D649 Anemia, unspecified: Secondary | ICD-10-CM | POA: Diagnosis not present

## 2021-04-16 DIAGNOSIS — D729 Disorder of white blood cells, unspecified: Secondary | ICD-10-CM

## 2021-04-16 DIAGNOSIS — E7800 Pure hypercholesterolemia, unspecified: Secondary | ICD-10-CM | POA: Diagnosis not present

## 2021-04-16 DIAGNOSIS — Z96643 Presence of artificial hip joint, bilateral: Secondary | ICD-10-CM | POA: Insufficient documentation

## 2021-04-16 DIAGNOSIS — Z7951 Long term (current) use of inhaled steroids: Secondary | ICD-10-CM | POA: Diagnosis not present

## 2021-04-16 DIAGNOSIS — R5383 Other fatigue: Secondary | ICD-10-CM | POA: Diagnosis not present

## 2021-04-16 DIAGNOSIS — E079 Disorder of thyroid, unspecified: Secondary | ICD-10-CM

## 2021-04-16 DIAGNOSIS — E538 Deficiency of other specified B group vitamins: Secondary | ICD-10-CM

## 2021-04-16 DIAGNOSIS — J45909 Unspecified asthma, uncomplicated: Secondary | ICD-10-CM | POA: Insufficient documentation

## 2021-04-16 DIAGNOSIS — Z96652 Presence of left artificial knee joint: Secondary | ICD-10-CM | POA: Diagnosis not present

## 2021-04-16 DIAGNOSIS — M797 Fibromyalgia: Secondary | ICD-10-CM

## 2021-04-16 DIAGNOSIS — I480 Paroxysmal atrial fibrillation: Secondary | ICD-10-CM | POA: Diagnosis not present

## 2021-04-16 DIAGNOSIS — Z136 Encounter for screening for cardiovascular disorders: Secondary | ICD-10-CM | POA: Diagnosis not present

## 2021-04-16 DIAGNOSIS — I10 Essential (primary) hypertension: Secondary | ICD-10-CM

## 2021-04-16 DIAGNOSIS — Z79899 Other long term (current) drug therapy: Secondary | ICD-10-CM | POA: Diagnosis not present

## 2021-04-16 DIAGNOSIS — E039 Hypothyroidism, unspecified: Secondary | ICD-10-CM | POA: Diagnosis not present

## 2021-04-16 DIAGNOSIS — D6869 Other thrombophilia: Secondary | ICD-10-CM | POA: Diagnosis not present

## 2021-04-16 DIAGNOSIS — I700 Atherosclerosis of aorta: Secondary | ICD-10-CM | POA: Diagnosis not present

## 2021-04-16 DIAGNOSIS — Z7901 Long term (current) use of anticoagulants: Secondary | ICD-10-CM | POA: Insufficient documentation

## 2021-04-16 DIAGNOSIS — K219 Gastro-esophageal reflux disease without esophagitis: Secondary | ICD-10-CM | POA: Diagnosis not present

## 2021-04-16 DIAGNOSIS — E559 Vitamin D deficiency, unspecified: Secondary | ICD-10-CM

## 2021-04-16 LAB — COMPREHENSIVE METABOLIC PANEL
ALT: 17 U/L (ref 0–44)
AST: 25 U/L (ref 15–41)
Albumin: 3.8 g/dL (ref 3.5–5.0)
Alkaline Phosphatase: 78 U/L (ref 38–126)
Anion gap: 8 (ref 5–15)
BUN: 12 mg/dL (ref 8–23)
CO2: 23 mmol/L (ref 22–32)
Calcium: 9.1 mg/dL (ref 8.9–10.3)
Chloride: 110 mmol/L (ref 98–111)
Creatinine, Ser: 0.8 mg/dL (ref 0.44–1.00)
GFR, Estimated: >60 mL/min (ref >60)
Glucose, Bld: 99 mg/dL (ref 70–99)
Potassium: 3.7 mmol/L (ref 3.5–5.1)
Sodium: 141 mmol/L (ref 135–145)
Total Bilirubin: 0.6 mg/dL (ref 0.3–1.2)
Total Protein: 7.1 g/dL (ref 6.5–8.1)

## 2021-04-16 LAB — CBC WITH DIFFERENTIAL/PLATELET
Hematocrit: 25.4 % — ABNORMAL LOW (ref 34.0–46.6)
Hemoglobin: 7.1 g/dL — ABNORMAL LOW (ref 11.1–15.9)
Lymphocytes Absolute: 1.3 10*3/uL (ref 0.7–3.1)
Lymphs: 15 %
MCH: 18.2 pg — ABNORMAL LOW (ref 26.6–33.0)
MCHC: 28 g/dL — ABNORMAL LOW (ref 31.5–35.7)
MCV: 65 fL — ABNORMAL LOW (ref 79–97)
MID (Absolute): 0.9 10*3/uL (ref 0.1–1.6)
MID: 10 %
Neutrophils Absolute: 6.3 10*3/uL (ref 1.4–7.0)
Neutrophils: 75 %
Platelets: 490 10*3/uL — ABNORMAL HIGH (ref 150–450)
RBC: 3.91 x10E6/uL (ref 3.77–5.28)
RDW: 19 % — ABNORMAL HIGH (ref 11.7–15.4)
WBC: 8.5 10*3/uL (ref 3.4–10.8)

## 2021-04-16 LAB — CBC
HCT: 24.1 % — ABNORMAL LOW (ref 36.0–46.0)
Hemoglobin: 6.5 g/dL — ABNORMAL LOW (ref 12.0–15.0)
MCH: 17.4 pg — ABNORMAL LOW (ref 26.0–34.0)
MCHC: 27 g/dL — ABNORMAL LOW (ref 30.0–36.0)
MCV: 64.4 fL — ABNORMAL LOW (ref 80.0–100.0)
Platelets: 480 10*3/uL — ABNORMAL HIGH (ref 150–400)
RBC: 3.74 MIL/uL — ABNORMAL LOW (ref 3.87–5.11)
RDW: 18.3 % — ABNORMAL HIGH (ref 11.5–15.5)
WBC: 9.3 10*3/uL (ref 4.0–10.5)
nRBC: 0 % (ref 0.0–0.2)

## 2021-04-16 LAB — PREPARE RBC (CROSSMATCH)

## 2021-04-16 MED ORDER — SODIUM CHLORIDE 0.9 % IV SOLN
10.0000 mL/h | Freq: Once | INTRAVENOUS | Status: AC
  Administered 2021-04-16: 10 mL/h via INTRAVENOUS

## 2021-04-16 NOTE — Assessment & Plan Note (Signed)
Noted on past labs 2019 = 141 and 2020 = 172 to 202.  No current supplement, recheck today and if remains low start IM injections or oral supplementation.  Referral to hematology.

## 2021-04-16 NOTE — Assessment & Plan Note (Signed)
Recent TSH within normal range, no current medications.  Check TSH and Free T4 today.

## 2021-04-16 NOTE — Assessment & Plan Note (Signed)
Chronic, ongoing with initial BP above goal and repeat improved.  Recommend she monitor BP at least a few mornings a week at home and document.  DASH diet at home.  Continue current medication regimen and adjust as needed + collaboration with cardiology.  Labs today: Lipid, TSH, CBC, CMP.  Return in 1 week.

## 2021-04-16 NOTE — Assessment & Plan Note (Signed)
Chronic, ongoing.  No symptoms at this time.  Well-controlled with Protonix at higher dose, continue collaboration with GI.  If ongoing anemia may benefit return to GI for further work-up.

## 2021-04-16 NOTE — Assessment & Plan Note (Signed)
Secondary to Eliquis and A-Fib.  Continue current medication regimen and adjust as needed.  Monitor CBC.

## 2021-04-16 NOTE — Progress Notes (Signed)
New Patient Office Visit  Subjective:  Patient ID: Mary Irwin, female    DOB: February 28, 1951  Age: 70 y.o. MRN: [id number]  CC:  Chief Complaint  Patient presents with   New Patient (Initial Visit)    Seen cardio last week, did a lot of blood tests and they came back abnormal, wants to have you take a look at the tests   Fatigue    HPI Mary Irwin presents for new patient visit to establish care.  Introduced to Designer, jewellery role and practice setting.  All questions answered.  Discussed provider/patient relationship and expectations.  HYPERTENSION / HYPERLIPIDEMIA + A-FIB Followed by cardiology, last saw on 04/07/21.  Taking Eliquis, Cardizem, HCTZ, and Metoprolol.  Aortic atherosclerosis noted on imaging 01/03/2020.  Currently not on statin.  She is scheduled for echo and stress test upcoming. Satisfied with current treatment? yes Duration of hypertension: chronic BP monitoring frequency: rarely BP range: 120-130/70-80 BP medication side effects: no Past BP meds: unsure Aspirin: no Recent stressors: no Recurrent headaches: no Visual changes: no Palpitations: yes Dyspnea: yes Chest pain: no Lower extremity edema: every day feet swell Dizzy/lightheaded: occasionally  ANEMIA Had labs drawn with cardiology on 04/07/21 for her SOB and fatigue. CBC was noted to have WBC 12.3, RBC 3.80, H/H 6.8/25.0, MCV 65.8, NEUTS 9.25, PLT 459.  She was advised, on review, by telephone on 04/12/21 to go to ER -- but she reports "I am sorry I did not go".  BNP, BMP, TSH were unremarkable.  Reports she has had similar in past and had to receive 2 transfusions, but they could never figure out what was wrong.  This was 2-3 years ago.  Her HGB at that time was 5.5.  This time she reports her symptoms are worse. Anemia status: uncontrolled Etiology of anemia:unknown Severity of anemia: severe Fatigue: yes Decreased exercise tolerance: yes  Dyspnea on exertion: yes Palpitations: yes Bleeding:  no Pica: no   GERD Taking Protonix 40 MG BID + Amitriptyline.  Last saw GI 04/02/20 - [facility], Dr. Chauncey Mann.  Followed by them for IBS and fibromyalgia + GERD.  She has chronic pain with with Fibromyalgia -- she does not recall what she has taken.  Recently went to physical therapy for back pain.  GERD control status: controlled Satisfied with current treatment? yes Heartburn frequency: none Medication side effects: no  Medication compliance: stable Previous GERD medications: none Antacid use frequency:  occasional Duration: none Nature: none Dysphagia: no Odynophagia:  no Hematemesis: no Blood in stool: no EGD: 07/31/2017  CHRONIC COUGH Followed by pulmonary and on review last visit appears to be 06/01/2017 with Dr. Raul Del.  Taking Breo and Albuterol.  She reports the cough started around the same time she had anemia in past. Satisfied with current treatment?: yes Albuterol/rescue inhaler frequency: 2 times a week Dyspnea frequency: every day Wheezing frequency: none Cough frequency: every day Nocturnal symptom frequency:  Limitation of activity: no Current upper respiratory symptoms: no Triggers: unknown Home peak flows: none Last Spirometry: 2018 Failed/intolerant to following asthma meds:  Asthma meds in past:  Aerochamber/spacer use: no Visits to ER or Urgent Care in past year: no Pneumovax: Up To Date Influenza: Up to Date    Past Medical History:  Diagnosis Date   Anemia    on iron supplements   Arthritis    all over   Asthma    once a week   Atrial fibrillation (HCC)    Dysrhythmia    Atrial  Fibrillation   Fibromyalgia    GERD (gastroesophageal reflux disease)    Hypertension    Hypothyroidism    no meds   IBS (irritable bowel syndrome)    PONV (postoperative nausea and vomiting)    Sinus trouble     Past Surgical History:  Procedure Laterality Date   BACK SURGERY     CATARACT EXTRACTION W/PHACO Right 04/22/2019   Procedure: CATARACT EXTRACTION  PHACO AND INTRAOCULAR LENS PLACEMENT ([place]) RIGHT;  Surgeon: Eulogio Bear, MD;  Location: [facility];  Service: Ophthalmology;  Laterality: Right;   CATARACT EXTRACTION W/PHACO Left 05/27/2019   Procedure: CATARACT EXTRACTION PHACO AND INTRAOCULAR LENS PLACEMENT ([place])  LEFT;  Surgeon: Eulogio Bear, MD;  Location: [facility];  Service: Ophthalmology;  Laterality: Left;   CHOLECYSTECTOMY N/A 09/22/2017   Procedure: LAPAROSCOPIC CHOLECYSTECTOMY WITH INTRAOPERATIVE CHOLANGIOGRAM;  Surgeon: Olean Ree, MD;  Location: [facility];  Service: General;  Laterality: N/A;   COLONOSCOPY WITH PROPOFOL N/A 05/23/2017   Procedure: COLONOSCOPY WITH PROPOFOL;  Surgeon: Lollie Sails, MD;  Location: [facility] ENDOSCOPY;  Service: Endoscopy;  Laterality: N/A;   ESOPHAGOGASTRODUODENOSCOPY (EGD) WITH PROPOFOL N/A 05/23/2017   Procedure: ESOPHAGOGASTRODUODENOSCOPY (EGD) WITH PROPOFOL;  Surgeon: Lollie Sails, MD;  Location: [facility] ENDOSCOPY;  Service: Endoscopy;  Laterality: N/A;   ESOPHAGOGASTRODUODENOSCOPY (EGD) WITH PROPOFOL N/A 07/31/2017   Procedure: ESOPHAGOGASTRODUODENOSCOPY (EGD) WITH PROPOFOL;  Surgeon: Lollie Sails, MD;  Location: [facility]COPY;  Service: Endoscopy;  Laterality: N/A;   HERNIA REPAIR     umbilical   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   JOINT REPLACEMENT Bilateral    total hip replacement    Family History  Problem Relation Age of Onset   Breast cancer Maternal Aunt 51   Hypertension Mother     Social History   Socioeconomic History   Marital status: Divorced    Spouse name: Not on file   Number of children: Not on file   Years of education: Not on file   Highest education level: Not on file  Occupational History   Not on file  Tobacco Use   Smoking status: Never   Smokeless tobacco: Never  Vaping Use   Vaping Use: Never used  Substance and Sexual Activity   Alcohol use: No   Drug use: No   Sexual activity: Not on file   Other Topics Concern   Not on file  Social History Narrative   Not on file   Social Determinants of Health   Financial Resource Strain: Not on file  Food Insecurity: Not on file  Transportation Needs: Not on file  Physical Activity: Not on file  Stress: Not on file  Social Connections: Not on file  Intimate Partner Violence: Not on file    ROS Review of Systems  Constitutional:  Positive for fatigue. Negative for activity change, appetite change, diaphoresis and fever.  Respiratory:  Positive for cough and shortness of breath. Negative for chest tightness and wheezing.   Cardiovascular:  Negative for chest pain, palpitations and leg swelling.  Gastrointestinal:  Positive for nausea (occasional). Negative for abdominal distention, abdominal pain, blood in stool, constipation, diarrhea and vomiting.  Endocrine: Negative for cold intolerance, heat intolerance, polydipsia, polyphagia and polyuria.  Neurological: Negative.   Psychiatric/Behavioral: Negative.     Objective:   Today's Vitals: BP 128/70   Pulse 97   Temp 98.9 F (37.2 C) (Oral)   Ht 5' 3.82" (1.621 m)   Wt 200 lb (90.7 kg)  SpO2 98%   BMI 34.52 kg/m   Physical Exam Vitals and nursing note reviewed.  Constitutional:      General: She is awake. She is not in acute distress.    Appearance: She is well-developed and well-groomed. She is obese. She is not ill-appearing or toxic-appearing.  HENT:     Head: Normocephalic and atraumatic.     Right Ear: Hearing normal.     Left Ear: Hearing normal.     Nose: Nose normal.  Eyes:     General: Lids are normal.        Right eye: No discharge.        Left eye: No discharge.     Conjunctiva/sclera: Conjunctivae normal.     Pupils: Pupils are equal, round, and reactive to light.  Neck:     Thyroid: No thyromegaly.     Vascular: No carotid bruit.  Cardiovascular:     Rate and Rhythm: Normal rate and regular rhythm.     Heart sounds: Murmur heard.  Systolic  murmur is present with a grade of 3/6.    No gallop.  Pulmonary:     Effort: Pulmonary effort is normal. No accessory muscle usage or respiratory distress.     Breath sounds: Normal breath sounds.  Abdominal:     General: Bowel sounds are normal. There is no distension.     Palpations: Abdomen is soft. There is no hepatomegaly.     Tenderness: There is no abdominal tenderness.  Musculoskeletal:     Cervical back: Normal range of motion and neck supple.     Right lower leg: Edema (trace) present.     Left lower leg: Edema (trace) present.  Lymphadenopathy:     Cervical: No cervical adenopathy.  Skin:    General: Skin is warm and dry.  Neurological:     Mental Status: She is alert and oriented to person, place, and time.     Deep Tendon Reflexes: Reflexes are normal and symmetric.     Reflex Scores:      Brachioradialis reflexes are 2+ on the right side and 2+ on the left side.      Patellar reflexes are 2+ on the right side and 2+ on the left side. Psychiatric:        Attention and Perception: Attention normal.        Mood and Affect: Mood normal.        Speech: Speech normal.        Behavior: Behavior normal. Behavior is cooperative.        Thought Content: Thought content normal.    Assessment & Plan:   Problem List Items Addressed This Visit       Cardiovascular and Mediastinum   Essential hypertension    Chronic, ongoing with initial BP above goal and repeat improved.  Recommend she monitor BP at least a few mornings a week at home and document.  DASH diet at home.  Continue current medication regimen and adjust as needed + collaboration with cardiology.  Labs today: Lipid, TSH, CBC, CMP.  Return in 1 week.        Paroxysmal atrial fibrillation (HCC) - Primary    Chronic, ongoing, rate controlled.  Followed by cardiology.  Continue this collaboration and current medication regimen as prescribed by them, including Eliquis.         Relevant Orders   Comprehensive  metabolic panel   Lipid Panel w/o Chol/HDL Ratio   Aortic atherosclerosis ([place])  Noted on CT 01/03/2020 -- discussed with patient and educated on this.  She is not currently on statin will check Lipid panel today and discussed with patient benefit of adding on a low dose statin to start to see if tolerance, she reports not having taken before.  She agrees with this plan dependent on lipid results.         Respiratory   Asthma without status asthmaticus    Chronic, ongoing, previously followed by pulmonary -- last seen 2018.  May benefit return to them annually due to ongoing cough present.  Discussed possible chest CT in future, as do not see where this was ever performed and may be beneficial to further assess chronic cough.  At this time continue Breo daily and Albuterol as needed.  CBC today.         Digestive   GERD without esophagitis    Chronic, ongoing.  No symptoms at this time.  Well-controlled with Protonix at higher dose, continue collaboration with GI.  If ongoing anemia may benefit return to GI for further work-up.         Endocrine   Thyroid disease    Recent TSH within normal range, no current medications.  Check TSH and Free T4 today.       Relevant Orders   TSH   T4, free     Hematopoietic and Hemostatic   Other thrombophilia ([place])    Secondary to Eliquis and A-Fib.  Continue current medication regimen and adjust as needed.  Monitor CBC.         Other   Anemia    Ongoing, noted on cardiology labs 04/07/21 with H/H 6.8/25.0 -- was advised to go to ER and did not attend.  Today on CBC = H/H 7.1/25.4, she is symptomatic with SOB and fatigue.  Have highly recommended she go to ER today for further assessment and possible transfusion.  She agrees to this.  Will check iron, ferritin, and B12 today.  Place urgent referral to hematology for further work-up, as has not seen them in past.  Follow-up in 1 week after ER visit.         Relevant Orders   Vitamin B12    Iron, TIBC and Ferritin Panel   CBC With Differential/Platelet   CBC With Differential/Platelet   Fibromyalgia    Chronic, ongoing.  Currently on Amitriptyline by GI for IBS and Fibro.  Continue this medication regimen and adjust as needed.  Discussed with her possible trial of Duloxetine in future for symptoms relief, which she is interested in -- may benefit fibromyalgia symptoms.  Return in 1 week after anemia further assessed.       Relevant Medications   amitriptyline (ELAVIL) 10 MG tablet   Hyperlipemia    Chronic, ongoing.  No current statin, reports she has not tried.  Recommend starting this.  Will check lipid panel today and further discuss next week, initiate Rosuvastatin 10 MG daily next visit and assess if benefit in 8 weeks.       Vitamin D deficiency    Ongoing with no current supplement. Check level today and initiate supplement as needed.       Relevant Orders   VITAMIN D 25 Hydroxy (Vit-D Deficiency, Fractures)   Neutrophilia    Noted on recent labs, will place referral to hematology.       B12 deficiency    Noted on past labs 2019 = 141 and 2020 = 172 to 202.  No current  supplement, recheck today and if remains low start IM injections or oral supplementation.  Referral to hematology.       Relevant Orders   Vitamin B12    Outpatient Encounter Medications as of 04/16/2021  Medication Sig   albuterol (PROVENTIL HFA;VENTOLIN HFA) 108 (90 Base) MCG/ACT inhaler Inhale 2 puffs into the lungs every 6 (six) hours as needed for wheezing or shortness of breath.   amitriptyline (ELAVIL) 10 MG tablet Take by mouth.   apixaban (ELIQUIS) 5 MG TABS tablet Take 5 mg by mouth 2 (two) times daily. Am and bedtime   diltiazem (CARDIZEM CD) 120 MG 24 hr capsule Take 300 mg by mouth daily. am   fluticasone furoate-vilanterol (BREO ELLIPTA) 200-25 MCG/INH AEPB Inhale 1 puff into the lungs daily. am   hydrochlorothiazide (HYDRODIURIL) 25 MG tablet Take 25 mg by mouth daily. am    metoprolol tartrate (LOPRESSOR) 25 MG tablet Take 25 mg by mouth daily at 10 pm.   pantoprazole (PROTONIX) 40 MG tablet Take 40 mg by mouth 2 (two) times daily. Am and bedtime   [DISCONTINUED] dicyclomine (BENTYL) 10 MG capsule Take 10 mg by mouth as needed for spasms.   [DISCONTINUED] doxycycline (VIBRAMYCIN) 100 MG capsule Take 1 capsule (100 mg total) by mouth 2 (two) times daily.   [DISCONTINUED] ferrous gluconate (FERGON) 240 (27 FE) MG tablet Take 240 mg by mouth daily. bedtime   No facility-administered encounter medications on file as of 04/16/2021.   ER called and notified of patient coming by private vehicle.  Follow-up: Return in about 1 week (around 04/23/2021) for Anemia.   Venita Lick, NP

## 2021-04-16 NOTE — ED Triage Notes (Signed)
Pt comes into the ED via POV c/o abnormal lab where her hgb was at 7.1 this morning.  Pt states she has a h/o of needing blood transfusions.  Pt is currently on Eliquis.  PT reports increased fatigue and SHOB.  Pt ambulatory to triage at this time.  PT denies seeing any rectal bleeding.

## 2021-04-16 NOTE — Patient Instructions (Signed)
[employer] medicine (25th ed., pp. 1059-1068). [place], [place]: Elsevier.">  Anemia  Anemia is a condition in which there is not enough red blood cells or hemoglobin in the blood. Hemoglobin is a substance in red blood cells thatcarries oxygen. When you do not have enough red blood cells or hemoglobin (are anemic), your body cannot get enough oxygen and your organs may not work properly. Asa result, you may feel very tired or have other problems. What are the causes? Common causes of anemia include: Excessive bleeding. Anemia can be caused by excessive bleeding inside or outside the body, including bleeding from the intestines or from heavy menstrual periods in females. Poor nutrition. Long-lasting (chronic) kidney, thyroid, and liver disease. Bone marrow disorders, spleen problems, and blood disorders. Cancer and treatments for cancer. HIV (human immunodeficiency virus) and AIDS (acquired immunodeficiency syndrome). Infections, medicines, and autoimmune disorders that destroy red blood cells. What are the signs or symptoms? Symptoms of this condition include: Minor weakness. Dizziness. Headache, or difficulties concentrating and sleeping. Heartbeats that feel irregular or faster than normal (palpitations). Shortness of breath, especially with exercise. Pale skin, lips, and nails, or cold hands and feet. Indigestion and nausea. Symptoms may occur suddenly or develop slowly. If your anemia is mild, you maynot have symptoms. How is this diagnosed? This condition is diagnosed based on blood tests, your medical history, and a physical exam. In some cases, a test may be needed in which cells are removed from the soft tissue inside of a bone and looked at under a microscope (bone marrow biopsy). Your health care provider may also check your stool (feces) for blood and may do additional testing to look for the cause of yourbleeding. Other tests may include: Imaging tests, such as a CT scan or  MRI. A procedure to see inside your esophagus and stomach (endoscopy). A procedure to see inside your colon and rectum (colonoscopy). How is this treated? Treatment for this condition depends on the cause. If you continue to lose a lot of blood, you may need to be treated at a hospital. Treatment may include: Taking supplements of iron, vitamin B12, or folic acid. Taking a hormone medicine (erythropoietin) that can help to stimulate red blood cell growth. Having a blood transfusion. This may be needed if you lose a lot of blood. Making changes to your diet. Having surgery to remove your spleen. Follow these instructions at home: Take over-the-counter and prescription medicines only as told by your health care provider. Take supplements only as told by your health care provider. Follow any diet instructions that you were given by your health care provider. Keep all follow-up visits as told by your health care provider. This is important. Contact a health care provider if: You develop new bleeding anywhere in the body. Get help right away if: You are very weak. You are short of breath. You have pain in your abdomen or chest. You are dizzy or feel faint. You have trouble concentrating. You have bloody stools, black stools, or tarry stools. You vomit repeatedly or you vomit up blood. These symptoms may represent a serious problem that is an emergency. Do not wait to see if the symptoms will go away. Get medical help right away. Call your local emergency services (911 in the [place] Do not drive yourself to the hospital. Summary Anemia is a condition in which you do not have enough red blood cells or enough of a substance in your red blood cells that carries oxygen (hemoglobin). Symptoms may occur suddenly   or develop slowly. If your anemia is mild, you may not have symptoms. This condition is diagnosed with blood tests, a medical history, and a physical exam. Other tests may be  needed. Treatment for this condition depends on the cause of the anemia. This information is not intended to replace advice given to you by your health care provider. Make sure you discuss any questions you have with your healthcare provider. Document Revised: 10/01/2019 Document Reviewed: 10/01/2019 Elsevier Patient Education  2022 [employer].

## 2021-04-16 NOTE — Assessment & Plan Note (Signed)
Noted on CT 01/03/2020 -- discussed with patient and educated on this.  She is not currently on statin will check Lipid panel today and discussed with patient benefit of adding on a low dose statin to start to see if tolerance, she reports not having taken before.  She agrees with this plan dependent on lipid results.

## 2021-04-16 NOTE — Assessment & Plan Note (Signed)
Chronic, ongoing.  Currently on Amitriptyline by GI for IBS and Fibro.  Continue this medication regimen and adjust as needed.  Discussed with her possible trial of Duloxetine in future for symptoms relief, which she is interested in -- may benefit fibromyalgia symptoms.  Return in 1 week after anemia further assessed.

## 2021-04-16 NOTE — Assessment & Plan Note (Signed)
Chronic, ongoing, previously followed by pulmonary -- last seen 2018.  May benefit return to them annually due to ongoing cough present.  Discussed possible chest CT in future, as do not see where this was ever performed and may be beneficial to further assess chronic cough.  At this time continue Breo daily and Albuterol as needed.  CBC today.

## 2021-04-16 NOTE — Assessment & Plan Note (Signed)
Ongoing with no current supplement. Check level today and initiate supplement as needed.

## 2021-04-16 NOTE — Assessment & Plan Note (Signed)
Chronic, ongoing.  No current statin, reports she has not tried.  Recommend starting this.  Will check lipid panel today and further discuss next week, initiate Rosuvastatin 10 MG daily next visit and assess if benefit in 8 weeks.

## 2021-04-16 NOTE — Assessment & Plan Note (Signed)
Ongoing, noted on cardiology labs 04/07/21 with H/H 6.8/25.0 -- was advised to go to ER and did not attend.  Today on CBC = H/H 7.1/25.4, she is symptomatic with SOB and fatigue.  Have highly recommended she go to ER today for further assessment and possible transfusion.  She agrees to this.  Will check iron, ferritin, and B12 today.  Place urgent referral to hematology for further work-up, as has not seen them in past.  Follow-up in 1 week after ER visit.

## 2021-04-16 NOTE — Assessment & Plan Note (Signed)
Noted on recent labs, will place referral to hematology.

## 2021-04-16 NOTE — Discharge Instructions (Signed)
You should follow-up with your regular primary care provider.  We have also given you a referral to the hematologist (blood specialist) at the cancer center.  You should make an appointment to follow-up within the next 1 to 2 weeks.  Return to the ER for new, worsening, or persistent weakness, shortness of breath, lightheadedness, abnormal bleeding or bruising, or any other new or worsening symptoms that concern you.

## 2021-04-16 NOTE — ED Provider Notes (Signed)
[facility] Emergency Department Provider Note ____________________________________________   Event Date/Time   First MD Initiated Contact with Patient 04/16/21 1829     (approximate)  I have reviewed the triage vital signs and the nursing notes.   HISTORY  Chief Complaint Abnormal Lab    HPI Mary Irwin is a 70 y.o. female with PMH as noted below including A. fib on Eliquis, GERD, anemia, and hypertension who presents due to a low hemoglobin found on outpatient lab work last week and this week.  The patient states that her hemoglobin today was 7.  She was instructed to come to the ED.  The patient states that last week she was at her cardiologist and the hemoglobin was 6.8.  She was instructed to come to the ED then, however she decided to wait until she saw her regular doctor.  The patient reports generalized fatigue and some shortness of breath but denies chest pain, fever, vomiting, blood in the stool, or dark tarry stools.  She denies any other abnormal bleeding or bruising.  She states that she once previously had anemia requiring a blood transfusion but did not follow-up with hematology after that.   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   Chronic bilateral low back pain with sciatica 04/11/2021   Vitamin D deficiency 04/11/2021   Neutrophilia 04/11/2021   B12 deficiency 04/11/2021   Premature ventricular contractions 08/06/2018   Anemia 06/01/2017   Asthma  without status asthmaticus 10/25/2016   Essential hypertension 10/25/2016   Hyperlipemia 10/25/2016   Thyroid disease 10/25/2016   Chronic seasonal allergic rhinitis due to pollen 10/11/2016   Fibromyalgia 10/11/2016   GERD without esophagitis 10/11/2016   Paroxysmal atrial fibrillation ([place]) 10/11/2016   Psychophysiological insomnia 10/11/2016   Recurrent major depressive disorder, in full remission ([place]) 10/11/2016    Past Surgical History:  Procedure Laterality Date   BACK SURGERY     CATARACT EXTRACTION W/PHACO Right 04/22/2019   Procedure: CATARACT EXTRACTION PHACO AND INTRAOCULAR LENS PLACEMENT ([place]) RIGHT;  Surgeon: Eulogio Bear, MD;  Location: [place];  Service: Ophthalmology;  Laterality: Right;   CATARACT EXTRACTION W/PHACO Left 05/27/2019   Procedure: CATARACT EXTRACTION PHACO AND INTRAOCULAR LENS PLACEMENT ([place])  LEFT;  Surgeon: Eulogio Bear, MD;  Location: [place];  Service: Ophthalmology;  Laterality: Left;   CHOLECYSTECTOMY N/A 09/22/2017   Procedure: LAPAROSCOPIC CHOLECYSTECTOMY WITH INTRAOPERATIVE CHOLANGIOGRAM;  Surgeon: Olean Ree, MD;  Location: [facility]S;  Service: General;  Laterality: N/A;   COLONOSCOPY WITH PROPOFOL N/A 05/23/2017   Procedure: COLONOSCOPY WITH PROPOFOL;  Surgeon: Lollie Sails, MD;  Location: [facility] ENDOSCOPY;  Service: Endoscopy;  Laterality: N/A;   ESOPHAGOGASTRODUODENOSCOPY (EGD) WITH PROPOFOL N/A 05/23/2017   Procedure: ESOPHAGOGASTRODUODENOSCOPY (EGD) WITH PROPOFOL;  Surgeon: Lollie Sails, MD;  Location: [facility] ENDOSCOPY;  Service: Endoscopy;  Laterality: N/A;   ESOPHAGOGASTRODUODENOSCOPY (EGD)  WITH PROPOFOL N/A 07/31/2017   Procedure: ESOPHAGOGASTRODUODENOSCOPY (EGD) WITH PROPOFOL;  Surgeon: Lollie Sails, MD;  Location: [facility] ENDOSCOPY;  Service: Endoscopy;  Laterality: N/A;   HERNIA REPAIR     umbilical   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   JOINT REPLACEMENT Bilateral     total hip replacement    Prior to Admission medications   Medication Sig Start Date End Date Taking? Authorizing Provider  albuterol (PROVENTIL HFA;VENTOLIN HFA) 108 (90 Base) MCG/ACT inhaler Inhale 2 puffs into the lungs every 6 (six) hours as needed for wheezing or shortness of breath.    [provider]  amitriptyline (ELAVIL) 10 MG tablet Take by mouth. 05/26/20 05/26/21  [provider]  apixaban (ELIQUIS) 5 MG TABS tablet Take 5 mg by mouth 2 (two) times daily. Am and bedtime    [provider]  diltiazem (CARDIZEM CD) 120 MG 24 hr capsule Take 300 mg by mouth daily. am    [provider]  fluticasone furoate-vilanterol (BREO ELLIPTA) 200-25 MCG/INH AEPB Inhale 1 puff into the lungs daily. am    [provider]  hydrochlorothiazide (HYDRODIURIL) 25 MG tablet Take 25 mg by mouth daily. am    [provider]  metoprolol tartrate (LOPRESSOR) 25 MG tablet Take 25 mg by mouth daily at 10 pm.    [provider]  pantoprazole (PROTONIX) 40 MG tablet Take 40 mg by mouth 2 (two) times daily. Am and bedtime 10/03/17   [provider]    Allergies Lisinopril and Metoclopramide  Family History  Problem Relation Age of Onset   Breast cancer Maternal Aunt 52   Hypertension Mother     Social History Social History   Tobacco Use   Smoking status: Never   Smokeless tobacco: Never  Vaping Use   Vaping Use: Never used  Substance Use Topics   Alcohol use: No   Drug use: No    Review of Systems  Constitutional: Positive for fatigue. Eyes: No redness. ENT: No sore throat. Cardiovascular: Denies chest pain. Respiratory: Denies shortness of breath. Gastrointestinal: No vomiting or diarrhea. Genitourinary: Negative for dysuria or hematuria.  Musculoskeletal: Negative for back pain. Skin: Negative for rash. Neurological: Negative for headaches, focal weakness or  numbness.   ____________________________________________   PHYSICAL EXAM:  VITAL SIGNS: ED Triage Vitals  Enc Vitals Group     BP 04/16/21 1301 (!) 178/74     Pulse Rate 04/16/21 1301 94     Resp 04/16/21 1301 18     Temp 04/16/21 1301 98.9 F (37.2 C)     Temp Source 04/16/21 1301 Oral     SpO2 04/16/21 1301 100 %     Weight 04/16/21 1302 200 lb (90.7 kg)     Height 04/16/21 1302 5\' 4"  (1.626 m)     Head Circumference --      Peak Flow --      Pain Score 04/16/21 1302 0     Pain Loc --      Pain Edu? --      Excl. in [place]? --     Constitutional: Alert and oriented. Well appearing and in no acute distress. Eyes: Conjunctivae are normal.  Head: Atraumatic. Nose: No congestion/rhinnorhea. Mouth/Throat: Mucous membranes are moist.   Neck: Normal range of motion.  Cardiovascular: Normal rate, regular rhythm. Good peripheral circulation. Respiratory: Normal respiratory effort.  No retractions.  Gastrointestinal: No distention.  Brown stool, guaiac negative on DRE. Musculoskeletal: Extremities warm and  well perfused.  Neurologic:  Normal speech and language. No gross focal neurologic deficits are appreciated.  Skin:  Skin is warm and dry. No rash noted. Psychiatric: Mood and affect are normal. Speech and behavior are normal.  ____________________________________________   LABS (all labs ordered are listed, but only abnormal results are displayed)  Labs Reviewed  CBC - Abnormal; Notable for the following components:      Result Value   RBC 3.74 (*)    Hemoglobin 6.5 (*)    HCT 24.1 (*)    MCV 64.4 (*)    MCH 17.4 (*)    MCHC 27.0 (*)    RDW 18.3 (*)    Platelets 480 (*)    All other components within normal limits  COMPREHENSIVE METABOLIC PANEL  CBC WITH DIFFERENTIAL/PLATELET  TYPE AND SCREEN  PREPARE RBC (CROSSMATCH)    ____________________________________________  EKG   ____________________________________________  RADIOLOGY    ____________________________________________   PROCEDURES  Procedure(s) performed: No  Procedures  Critical Care performed: No ____________________________________________   INITIAL IMPRESSION / ASSESSMENT AND PLAN / ED COURSE  Pertinent labs & imaging results that were available during my care of the patient were reviewed by me and considered in my medical decision making (see chart for details).   70 year old female with PMH as noted above including A. fib on Eliquis, GERD, anemia, and hypertension presents due to a low hemoglobin found on lab work-up today.  She reports generalized fatigue and some shortness of breath but denies other focal symptoms, and has no known abnormal bleeding.  I reviewed the past medical records in [place].  The patient was admitted in 2018 with anemia and a hemoglobin of 5.5.  She had a normal colonoscopy but was not able to complete an upper endoscopy due to spasms.  It does not appear that she followed up with hematology after this and has not required any transfusions since that time.  On exam, the patient is well-appearing.  Her vital signs are normal.  The physical exam is unremarkable.  DRE reveals brown stool which is guaiac negative.  Lab work-up here confirms a hemoglobin of 6.5, microcytic anemia.  We will transfuse 2 units of PRBCs and discuss with hematology.  ----------------------------------------- 11:49 PM on 04/16/2021 -----------------------------------------  I discussed the patient's case with Dr. Rogue Bussing from hematology.  He agrees since this appears to be a somewhat chronic anemia, the patient would be appropriate for outpatient follow-up with hematology within the next 1 to 2 weeks.  The patient herself strongly prefers to go home tonight if at all possible.  She is pending the second unit of blood and then we  will check a repeat CBC.  Anticipate discharge home if it improves appropriately.  I signed the patient out to the oncoming ED physician Dr. Tamala Julian. ____________________________________________   FINAL CLINICAL IMPRESSION(S) / ED DIAGNOSES  Final diagnoses:  Symptomatic anemia      NEW MEDICATIONS STARTED DURING THIS VISIT:  New Prescriptions   No medications on file     Note:  This document was prepared using Dragon voice recognition software and may include unintentional dictation errors.    Arta Silence, MD 04/16/21 2350

## 2021-04-16 NOTE — Assessment & Plan Note (Signed)
Chronic, ongoing, rate controlled.  Followed by cardiology.  Continue this collaboration and current medication regimen as prescribed by them, including Eliquis.

## 2021-04-17 ENCOUNTER — Other Ambulatory Visit: Payer: Self-pay | Admitting: Nurse Practitioner

## 2021-04-17 LAB — BPAM RBC
Blood Product Expiration Date: 202206162359
Blood Product Expiration Date: 202207072359
ISSUE DATE / TIME: 202206101944
ISSUE DATE / TIME: 202206102238
Unit Type and Rh: 6200
Unit Type and Rh: 6200

## 2021-04-17 LAB — TYPE AND SCREEN
ABO/RH(D): A POS
Antibody Screen: NEGATIVE
Unit division: 0
Unit division: 0

## 2021-04-17 MED ORDER — FERROUS SULFATE ER 142 (45 FE) MG PO TBCR: 1.0000 | EXTENDED_RELEASE_TABLET | Freq: Two times a day (BID) | ORAL | 12 refills | Status: DC

## 2021-04-17 MED ORDER — CHOLECALCIFEROL 1.25 MG (50000 UT) PO TABS: 1.0000 | ORAL_TABLET | ORAL | 0 refills | Status: DC

## 2021-04-17 MED ORDER — ROSUVASTATIN CALCIUM 10 MG PO TABS: 10.0000 mg | ORAL_TABLET | Freq: Every day | ORAL | 4 refills | Status: DC

## 2021-04-17 NOTE — Telephone Encounter (Signed)
Requested medication (s) are due for refill today: -  Requested medication (s) are on the active medication list: yes  Last refill:  04/17/21  Future visit scheduled: yes  Notes to clinic:  NT not delegated to refill or refuse this med   Requested Prescriptions  Pending Prescriptions Disp Refills   Cholecalciferol (VITAMIN D3) 1.25 MG (50000 UT) CAPS [Pharmacy Med Name: VITAMIN D3 50,000 IU (CHOLE) CAP] 12 capsule     Sig: TAKE ONE CAPSULE BY MOUTH ONCE A WEEK FOR 8 WEEKS AND THEN STOP. RETURN TO OFFICE FOR LAB DRAW.      Endocrinology:  Vitamins - Vitamin D Supplementation Failed - 04/17/2021  5:55 PM      Failed - 50,000 IU strengths are not delegated      Failed - Phosphate in normal range and within 360 days    Phosphorus  Date Value Ref Range Status  09/21/2017 4.7 (H) 2.5 - 4.6 mg/dL Final          Failed - Vitamin D in normal range and within 360 days    Vit D, 25-Hydroxy  Date Value Ref Range Status  04/16/2021 15.7 (L) 30.0 - 100.0 ng/mL Final    Comment:    Vitamin D deficiency has been defined by the [place] practice guideline as a level of serum 25-OH vitamin D less than 20 ng/mL (1,2). The Endocrine Society went on to further define vitamin D insufficiency as a level between 21 and 29 ng/mL (2). 1. IOM (Institute of Medicine). 2010. Dietary reference    intakes for calcium and D. [place]: The    [employer]. 2. Holick MF, [place], Bischoff-Ferrari HA, et al.    Evaluation, treatment, and prevention of vitamin D    deficiency: an Endocrine Society clinical practice    guideline. JCEM. 2011 Jul; 96(7):1911-30.           Passed - Ca in normal range and within 360 days    Calcium  Date Value Ref Range Status  04/16/2021 9.5 8.7 - 10.3 mg/dL Final          Passed - Valid encounter within last 12 months    Recent Outpatient Visits           Yesterday Paroxysmal atrial fibrillation [facility])    [place], Barbaraann Faster, NP       Future Appointments             In 6 days [place], Scheryl Darter, NP [place], [facility]

## 2021-04-17 NOTE — Progress Notes (Signed)
Contacted via MyChart The 10-year ASCVD risk score Mikey Bussing DC Jr., et al., 2013) is: 16.9%   Values used to calculate the score:     Age: 70 years     Sex: Female     Is Non-Hispanic African American: No     Diabetic: No     Tobacco smoker: No     Systolic Blood Pressure: 482 mmHg     Is BP treated: Yes     HDL Cholesterol: 48 mg/dL     Total Cholesterol: 190 mg/dL   Good evening Mary Irwin, thank you for going to ER.  I saw they did give you tranfusion and hope that helps you feel a little better.  Also ensure to schedule with hematology when they call.  Your labs have returned: - Glucose, sugar, is mildly elevated -- we will continue to monitor - Kidney function, creatinine and eGFR, and liver function, AST and ALT, are normal. - Thyroid testing normal = TSH and Free T4.   - Vitamin D level is quite low, I am going to send in a higher dose weekly Vitamin D for you to take at this time and we will recheck in 8 weeks.  This is good for muscle and bone health. - B12 level has not returned yet, but if low I will let you know.  Iron level is low at 12 and ferritin low at 7 == definitely some anemia is present.  Hematology will be beneficial for this.  At this time I will send in some iron for you to start taking, please ensure you take with some orange juice to help it absorb. - Cholesterol levels are elevated with LDL 114, I am going to send in a little Rosuvastatin for you to start taking to help reduce stroke risk.  If any major muscle pains with this let me know ASAP.  Any questions? Keep being awesome!!  Thank you for allowing me to participate in your care.  I appreciate you. Kindest regards, Mary Irwin

## 2021-04-17 NOTE — ED Provider Notes (Signed)
Patient received in signout from Dr. Cherylann Banas pending 2 units of PRBC transfusion.  2 units transfused without evidence of allergic reaction or complication.  Patient reports feeling better.  Heart rate slowing.  We will discharge with return precautions and information to follow-up with hematology as an outpatient.  We discussed starting iron supplementation.   Vladimir Crofts, MD 04/17/21 [phone]

## 2021-04-17 NOTE — Addendum Note (Signed)
Addended by: Marnee Guarneri T on: 04/17/2021 04:02 PM   Modules accepted: Orders

## 2021-04-19 NOTE — Telephone Encounter (Signed)
Scheduled 6/17

## 2021-04-21 DIAGNOSIS — I493 Ventricular premature depolarization: Secondary | ICD-10-CM | POA: Diagnosis not present

## 2021-04-21 DIAGNOSIS — R0602 Shortness of breath: Secondary | ICD-10-CM | POA: Diagnosis not present

## 2021-04-21 DIAGNOSIS — I4891 Unspecified atrial fibrillation: Secondary | ICD-10-CM | POA: Diagnosis not present

## 2021-04-21 DIAGNOSIS — R6889 Other general symptoms and signs: Secondary | ICD-10-CM | POA: Diagnosis not present

## 2021-04-21 LAB — COMPREHENSIVE METABOLIC PANEL
ALT: 16 IU/L (ref 0–32)
AST: 22 IU/L (ref 0–40)
Albumin/Globulin Ratio: 1.9 (ref 1.2–2.2)
Albumin: 4.5 g/dL (ref 3.8–4.8)
Alkaline Phosphatase: 115 IU/L (ref 44–121)
BUN/Creatinine Ratio: 15 (ref 12–28)
BUN: 11 mg/dL (ref 8–27)
Bilirubin Total: <0.2 mg/dL (ref 0.0–1.2)
CO2: 16 mmol/L — ABNORMAL LOW (ref 20–29)
Calcium: 9.5 mg/dL (ref 8.7–10.3)
Chloride: 105 mmol/L (ref 96–106)
Creatinine, Ser: 0.73 mg/dL (ref 0.57–1.00)
Globulin, Total: 2.4 g/dL (ref 1.5–4.5)
Glucose: 104 mg/dL — ABNORMAL HIGH (ref 65–99)
Potassium: 3.8 mmol/L (ref 3.5–5.2)
Sodium: 143 mmol/L (ref 134–144)
Total Protein: 6.9 g/dL (ref 6.0–8.5)
eGFR: 88 mL/min/{1.73_m2} (ref >59)

## 2021-04-21 LAB — IRON,TIBC AND FERRITIN PANEL
Ferritin: 7 ng/mL — ABNORMAL LOW (ref 15–150)
Iron Saturation: 2 % — CL (ref 15–55)
Iron: 12 ug/dL — ABNORMAL LOW (ref 27–139)
Total Iron Binding Capacity: 541 ug/dL — ABNORMAL HIGH (ref 250–450)
UIBC: 529 ug/dL — ABNORMAL HIGH (ref 118–369)

## 2021-04-21 LAB — LIPID PANEL W/O CHOL/HDL RATIO
Cholesterol, Total: 190 mg/dL (ref 100–199)
HDL: 48 mg/dL (ref >39)
LDL Chol Calc (NIH): 114 mg/dL — ABNORMAL HIGH (ref 0–99)
Triglycerides: 161 mg/dL — ABNORMAL HIGH (ref 0–149)
VLDL Cholesterol Cal: 28 mg/dL (ref 5–40)

## 2021-04-21 LAB — TSH: TSH: 3.54 u[IU]/mL (ref 0.450–4.500)

## 2021-04-21 LAB — VITAMIN B12: Vitamin B-12: 229 pg/mL — ABNORMAL LOW (ref 232–1245)

## 2021-04-21 LAB — VITAMIN D 25 HYDROXY (VIT D DEFICIENCY, FRACTURES): Vit D, 25-Hydroxy: 15.7 ng/mL — ABNORMAL LOW (ref 30.0–100.0)

## 2021-04-21 LAB — T4, FREE: Free T4: 1.15 ng/dL (ref 0.82–1.77)

## 2021-04-22 ENCOUNTER — Encounter: Payer: Self-pay | Admitting: Nurse Practitioner

## 2021-04-22 ENCOUNTER — Inpatient Hospital Stay: Payer: Medicare HMO

## 2021-04-22 ENCOUNTER — Inpatient Hospital Stay: Payer: Medicare HMO | Attending: Nurse Practitioner | Admitting: Nurse Practitioner

## 2021-04-22 DIAGNOSIS — G479 Sleep disorder, unspecified: Secondary | ICD-10-CM | POA: Insufficient documentation

## 2021-04-22 DIAGNOSIS — K589 Irritable bowel syndrome without diarrhea: Secondary | ICD-10-CM | POA: Diagnosis not present

## 2021-04-22 DIAGNOSIS — Z780 Asymptomatic menopausal state: Secondary | ICD-10-CM

## 2021-04-22 DIAGNOSIS — M797 Fibromyalgia: Secondary | ICD-10-CM | POA: Diagnosis not present

## 2021-04-22 DIAGNOSIS — R5383 Other fatigue: Secondary | ICD-10-CM | POA: Insufficient documentation

## 2021-04-22 DIAGNOSIS — Z7901 Long term (current) use of anticoagulants: Secondary | ICD-10-CM | POA: Insufficient documentation

## 2021-04-22 DIAGNOSIS — M2550 Pain in unspecified joint: Secondary | ICD-10-CM | POA: Diagnosis not present

## 2021-04-22 DIAGNOSIS — D519 Vitamin B12 deficiency anemia, unspecified: Secondary | ICD-10-CM

## 2021-04-22 DIAGNOSIS — K219 Gastro-esophageal reflux disease without esophagitis: Secondary | ICD-10-CM | POA: Insufficient documentation

## 2021-04-22 DIAGNOSIS — Z803 Family history of malignant neoplasm of breast: Secondary | ICD-10-CM | POA: Diagnosis not present

## 2021-04-22 DIAGNOSIS — I10 Essential (primary) hypertension: Secondary | ICD-10-CM | POA: Diagnosis not present

## 2021-04-22 DIAGNOSIS — I4891 Unspecified atrial fibrillation: Secondary | ICD-10-CM | POA: Insufficient documentation

## 2021-04-22 DIAGNOSIS — Z9049 Acquired absence of other specified parts of digestive tract: Secondary | ICD-10-CM | POA: Diagnosis not present

## 2021-04-22 DIAGNOSIS — Z79899 Other long term (current) drug therapy: Secondary | ICD-10-CM | POA: Insufficient documentation

## 2021-04-22 DIAGNOSIS — D649 Anemia, unspecified: Secondary | ICD-10-CM

## 2021-04-22 DIAGNOSIS — Z888 Allergy status to other drugs, medicaments and biological substances status: Secondary | ICD-10-CM | POA: Diagnosis not present

## 2021-04-22 DIAGNOSIS — D509 Iron deficiency anemia, unspecified: Secondary | ICD-10-CM | POA: Insufficient documentation

## 2021-04-22 DIAGNOSIS — R0602 Shortness of breath: Secondary | ICD-10-CM | POA: Insufficient documentation

## 2021-04-22 DIAGNOSIS — Z8249 Family history of ischemic heart disease and other diseases of the circulatory system: Secondary | ICD-10-CM | POA: Insufficient documentation

## 2021-04-22 DIAGNOSIS — E538 Deficiency of other specified B group vitamins: Secondary | ICD-10-CM | POA: Diagnosis not present

## 2021-04-22 DIAGNOSIS — F32A Depression, unspecified: Secondary | ICD-10-CM | POA: Insufficient documentation

## 2021-04-22 DIAGNOSIS — Z8781 Personal history of (healed) traumatic fracture: Secondary | ICD-10-CM

## 2021-04-22 DIAGNOSIS — E039 Hypothyroidism, unspecified: Secondary | ICD-10-CM | POA: Insufficient documentation

## 2021-04-23 ENCOUNTER — Inpatient Hospital Stay: Payer: Medicare HMO

## 2021-04-23 ENCOUNTER — Ambulatory Visit (INDEPENDENT_AMBULATORY_CARE_PROVIDER_SITE_OTHER): Payer: Medicare HMO | Admitting: Nurse Practitioner

## 2021-04-23 ENCOUNTER — Other Ambulatory Visit: Payer: Self-pay

## 2021-04-23 ENCOUNTER — Encounter: Payer: Self-pay | Admitting: Nurse Practitioner

## 2021-04-23 VITALS — BP 138/75 | HR 94 | Temp 99.0°F | Ht 64.17 in | Wt 201.6 lb

## 2021-04-23 DIAGNOSIS — E039 Hypothyroidism, unspecified: Secondary | ICD-10-CM | POA: Diagnosis not present

## 2021-04-23 DIAGNOSIS — K589 Irritable bowel syndrome without diarrhea: Secondary | ICD-10-CM | POA: Diagnosis not present

## 2021-04-23 DIAGNOSIS — D649 Anemia, unspecified: Secondary | ICD-10-CM

## 2021-04-23 DIAGNOSIS — K219 Gastro-esophageal reflux disease without esophagitis: Secondary | ICD-10-CM | POA: Diagnosis not present

## 2021-04-23 DIAGNOSIS — D509 Iron deficiency anemia, unspecified: Secondary | ICD-10-CM

## 2021-04-23 DIAGNOSIS — I4891 Unspecified atrial fibrillation: Secondary | ICD-10-CM | POA: Diagnosis not present

## 2021-04-23 DIAGNOSIS — M2550 Pain in unspecified joint: Secondary | ICD-10-CM | POA: Diagnosis not present

## 2021-04-23 DIAGNOSIS — I10 Essential (primary) hypertension: Secondary | ICD-10-CM | POA: Diagnosis not present

## 2021-04-23 DIAGNOSIS — R0602 Shortness of breath: Secondary | ICD-10-CM | POA: Diagnosis not present

## 2021-04-23 DIAGNOSIS — R5383 Other fatigue: Secondary | ICD-10-CM | POA: Diagnosis not present

## 2021-04-23 DIAGNOSIS — E538 Deficiency of other specified B group vitamins: Secondary | ICD-10-CM | POA: Diagnosis not present

## 2021-04-23 LAB — COMPREHENSIVE METABOLIC PANEL
ALT: 25 U/L (ref 0–44)
AST: 38 U/L (ref 15–41)
Albumin: 4 g/dL (ref 3.5–5.0)
Alkaline Phosphatase: 95 U/L (ref 38–126)
Anion gap: 11 (ref 5–15)
BUN: 12 mg/dL (ref 8–23)
CO2: 24 mmol/L (ref 22–32)
Calcium: 9.4 mg/dL (ref 8.9–10.3)
Chloride: 103 mmol/L (ref 98–111)
Creatinine, Ser: 0.73 mg/dL (ref 0.44–1.00)
GFR, Estimated: >60 mL/min (ref >60)
Glucose, Bld: 103 mg/dL — ABNORMAL HIGH (ref 70–99)
Potassium: 4 mmol/L (ref 3.5–5.1)
Sodium: 138 mmol/L (ref 135–145)
Total Bilirubin: 0.4 mg/dL (ref 0.3–1.2)
Total Protein: 7.4 g/dL (ref 6.5–8.1)

## 2021-04-23 LAB — CBC WITH DIFFERENTIAL/PLATELET
Abs Immature Granulocytes: 0.09 10*3/uL — ABNORMAL HIGH (ref 0.00–0.07)
Basophils Absolute: 0.1 10*3/uL (ref 0.0–0.1)
Basophils Relative: 1 %
Eosinophils Absolute: 0.4 10*3/uL (ref 0.0–0.5)
Eosinophils Relative: 5 %
HCT: 34.7 % — ABNORMAL LOW (ref 36.0–46.0)
Hemoglobin: 10 g/dL — ABNORMAL LOW (ref 12.0–15.0)
Immature Granulocytes: 1 %
Lymphocytes Relative: 14 %
Lymphs Abs: 1.2 10*3/uL (ref 0.7–4.0)
MCH: 20.3 pg — ABNORMAL LOW (ref 26.0–34.0)
MCHC: 28.8 g/dL — ABNORMAL LOW (ref 30.0–36.0)
MCV: 70.5 fL — ABNORMAL LOW (ref 80.0–100.0)
Monocytes Absolute: 0.7 10*3/uL (ref 0.1–1.0)
Monocytes Relative: 8 %
Neutro Abs: 5.9 10*3/uL (ref 1.7–7.7)
Neutrophils Relative %: 71 %
Platelets: 454 10*3/uL — ABNORMAL HIGH (ref 150–400)
RBC: 4.92 MIL/uL (ref 3.87–5.11)
RDW: 24.5 % — ABNORMAL HIGH (ref 11.5–15.5)
WBC: 8.4 10*3/uL (ref 4.0–10.5)
nRBC: 0 % (ref 0.0–0.2)

## 2021-04-23 LAB — IRON AND TIBC
Iron: 27 ug/dL — ABNORMAL LOW (ref 28–170)
Saturation Ratios: 4 % — ABNORMAL LOW (ref 10.4–31.8)
TIBC: 626 ug/dL — ABNORMAL HIGH (ref 250–450)
UIBC: 599 ug/dL

## 2021-04-23 LAB — FERRITIN: Ferritin: 12 ng/mL (ref 11–307)

## 2021-04-23 LAB — RETICULOCYTES
Immature Retic Fract: 31.7 % — ABNORMAL HIGH (ref 2.3–15.9)
RBC.: 4.92 MIL/uL (ref 3.87–5.11)
Retic Count, Absolute: 69.4 10*3/uL (ref 19.0–186.0)
Retic Ct Pct: 1.4 % (ref 0.4–3.1)

## 2021-04-23 LAB — SEDIMENTATION RATE: Sed Rate: 44 mm/hr — ABNORMAL HIGH (ref 0–30)

## 2021-04-23 LAB — FOLATE: Folate: 10.6 ng/mL (ref >5.9)

## 2021-04-23 LAB — DAT, POLYSPECIFIC AHG (ARMC ONLY): Polyspecific AHG test: NEGATIVE

## 2021-04-23 LAB — VITAMIN B12: Vitamin B-12: 170 pg/mL — ABNORMAL LOW (ref 180–914)

## 2021-04-23 LAB — LACTATE DEHYDROGENASE: LDH: 152 U/L (ref 98–192)

## 2021-04-23 NOTE — Progress Notes (Signed)
Established Patient Office Visit  Subjective:  Patient ID: Mary Irwin, female    DOB: [birth year]  Age: 70 y.o. MRN: [id number]  CC:  Chief Complaint  Patient presents with   Hospitalization Follow-up    Was sent to [facility] last Friday by [facility] for a transfusion. Here to follow up     HPI Mary Irwin presents for follow-up on anemia  She was seen in the office last Friday, 04/16/21, where labs showed an hemoglobin of 7.0. She was instructed to go to the Emergency Room where she went after the appointment. In the ER, she received 2 units PRBC and was told to follow-up with hematology and PCP. She had an appointment with hematology yesterday 04/22/21. Unable to view the note at this time, however patient said they are going to do a bunch of blood work to determine the cause of her anemia.   Today, she is feeling much better. Still endorses fatigue and minimal shortness of breath with exertion. Denies chest pain, bleeding, dark stools.   Past Medical History:  Diagnosis Date   Anemia    on iron supplements   Arthritis    all over   Asthma    once a week   Atrial fibrillation (HCC)    Dysrhythmia    Atrial Fibrillation   Fibromyalgia    GERD (gastroesophageal reflux disease)    Hypertension    Hypothyroidism    no meds   IBS (irritable bowel syndrome)    PONV (postoperative nausea and vomiting)    Sinus trouble     Past Surgical History:  Procedure Laterality Date   BACK SURGERY     CATARACT EXTRACTION W/PHACO Right 04/22/2019   Procedure: CATARACT EXTRACTION PHACO AND INTRAOCULAR LENS PLACEMENT ([place]) RIGHT;  Surgeon: Eulogio Bear, MD;  Location: [place];  Service: Ophthalmology;  Laterality: Right;   CATARACT EXTRACTION W/PHACO Left 05/27/2019   Procedure: CATARACT EXTRACTION PHACO AND INTRAOCULAR LENS PLACEMENT ([place])  LEFT;  Surgeon: Eulogio Bear, MD;  Location: [place];  Service: Ophthalmology;  Laterality: Left;   CHOLECYSTECTOMY  N/A 09/22/2017   Procedure: LAPAROSCOPIC CHOLECYSTECTOMY WITH INTRAOPERATIVE CHOLANGIOGRAM;  Surgeon: Olean Ree, MD;  Location: [facility];  Service: General;  Laterality: N/A;   COLONOSCOPY WITH PROPOFOL N/A 05/23/2017   Procedure: COLONOSCOPY WITH PROPOFOL;  Surgeon: Lollie Sails, MD;  Location: [facility] ENDOSCOPY;  Service: Endoscopy;  Laterality: N/A;   ESOPHAGOGASTRODUODENOSCOPY (EGD) WITH PROPOFOL N/A 05/23/2017   Procedure: ESOPHAGOGASTRODUODENOSCOPY (EGD) WITH PROPOFOL;  Surgeon: Lollie Sails, MD;  Location: [facility] ENDOSCOPY;  Service: Endoscopy;  Laterality: N/A;   ESOPHAGOGASTRODUODENOSCOPY (EGD) WITH PROPOFOL N/A 07/31/2017   Procedure: ESOPHAGOGASTRODUODENOSCOPY (EGD) WITH PROPOFOL;  Surgeon: Lollie Sails, MD;  Location: [facility] ENDOSCOPY;  Service: Endoscopy;  Laterality: N/A;   HERNIA REPAIR     umbilical   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   JOINT REPLACEMENT Bilateral    total hip replacement    Family History  Problem Relation Age of Onset   Breast cancer Maternal Aunt 52   Hypertension Mother     Social History   Socioeconomic History   Marital status: Divorced    Spouse name: Not on file   Number of children: Not on file   Years of education: Not on file   Highest education level: Not on file  Occupational History   Not on file  Tobacco Use   Smoking status: Never   Smokeless tobacco: Never  Vaping Use   Vaping Use: Never used  Substance and Sexual Activity   Alcohol use: No   Drug use: No   Sexual activity: Not on file  Other Topics Concern   Not on file  Social History Narrative   Not on file   Social Determinants of Health   Financial Resource Strain: Not on file  Food Insecurity: Not on file  Transportation Needs: Not on file  Physical Activity: Not on file  Stress: Not on file  Social Connections: Not on file  Intimate Partner Violence: Not on file    Outpatient Medications Prior to Visit  Medication Sig  Dispense Refill   albuterol (PROVENTIL HFA;VENTOLIN HFA) 108 (90 Base) MCG/ACT inhaler Inhale 2 puffs into the lungs every 6 (six) hours as needed for wheezing or shortness of breath.     amitriptyline (ELAVIL) 10 MG tablet Take 10 mg by mouth at bedtime.     apixaban (ELIQUIS) 5 MG TABS tablet Take 5 mg by mouth 2 (two) times daily. Am and bedtime     Cholecalciferol (VITAMIN D3) 1.25 MG (50000 UT) CAPS TAKE ONE CAPSULE BY MOUTH ONCE A WEEK FOR 8 WEEKS AND THEN STOP. RETURN TO OFFICE FOR LAB DRAW. 12 capsule 4   diltiazem (TIAZAC) 300 MG 24 hr capsule Take 300 mg by mouth daily.     fluticasone furoate-vilanterol (BREO ELLIPTA) 200-25 MCG/INH AEPB Inhale 1 puff into the lungs daily. am     hydrochlorothiazide (HYDRODIURIL) 25 MG tablet Take 25 mg by mouth daily. am     pantoprazole (PROTONIX) 40 MG tablet Take 40 mg by mouth 2 (two) times daily. Am and bedtime     rosuvastatin (CRESTOR) 10 MG tablet Take 1 tablet (10 mg total) by mouth daily. 90 tablet 4   No facility-administered medications prior to visit.    Allergies  Allergen Reactions   Lisinopril Swelling    After further review this may have been caused by Reglan   Metoclopramide Swelling    ROS Review of Systems  Constitutional:  Positive for fatigue.  HENT: Negative.    Respiratory:  Positive for shortness of breath (minimal with exertion).   Cardiovascular: Negative.   Gastrointestinal: Negative.   Genitourinary: Negative.   Skin: Negative.   Neurological:  Positive for light-headedness (2 days ago, for just part of the day). Negative for dizziness and headaches.     Objective:    Physical Exam Vitals and nursing note reviewed.  Constitutional:      General: She is not in acute distress.    Appearance: Normal appearance.  HENT:     Head: Normocephalic and atraumatic.  Eyes:     Conjunctiva/sclera: Conjunctivae normal.  Cardiovascular:     Rate and Rhythm: Normal rate and regular rhythm.     Pulses: Normal  pulses.     Heart sounds: Murmur heard.  Pulmonary:     Effort: Pulmonary effort is normal.     Breath sounds: Normal breath sounds.  Abdominal:     Palpations: Abdomen is soft.     Tenderness: There is no abdominal tenderness.  Musculoskeletal:     Cervical back: Normal range of motion.  Skin:    General: Skin is warm and dry.  Neurological:     General: No focal deficit present.     Mental Status: She is alert and oriented to person, place, and time.  Psychiatric:        Mood and Affect: Mood normal.  Behavior: Behavior normal.        Thought Content: Thought content normal.        Judgment: Judgment normal.    BP 138/75   Pulse 94   Temp 99 F (37.2 C) (Oral)   Ht 5' 4.17" (1.63 m)   Wt 201 lb 9.6 oz (91.4 kg)   SpO2 98%   BMI 34.42 kg/m  Wt Readings from Last 3 Encounters:  04/23/21 201 lb 9.6 oz (91.4 kg)  04/16/21 200 lb (90.7 kg)  04/16/21 200 lb (90.7 kg)     There are no preventive care reminders to display for this patient.  There are no preventive care reminders to display for this patient.  Lab Results  Component Value Date   TSH 3.540 04/16/2021   Lab Results  Component Value Date   WBC 9.3 04/16/2021   HGB 6.5 (L) 04/16/2021   HCT 24.1 (L) 04/16/2021   MCV 64.4 (L) 04/16/2021   PLT 480 (H) 04/16/2021   Lab Results  Component Value Date   NA 143 04/16/2021   K 3.8 04/16/2021   CO2 16 (L) 04/16/2021   GLUCOSE 104 (H) 04/16/2021   BUN 11 04/16/2021   CREATININE 0.73 04/16/2021   BILITOT <0.2 04/16/2021   ALKPHOS 115 04/16/2021   AST 22 04/16/2021   ALT 16 04/16/2021   PROT 6.9 04/16/2021   ALBUMIN 4.5 04/16/2021   CALCIUM 9.5 04/16/2021   ANIONGAP 8 04/16/2021   EGFR 88 04/16/2021   Lab Results  Component Value Date   CHOL 190 04/16/2021   Lab Results  Component Value Date   HDL 48 04/16/2021   Lab Results  Component Value Date   LDLCALC 114 (H) 04/16/2021   Lab Results  Component Value Date   TRIG 161 (H)  04/16/2021   No results found for: CHOLHDL No results found for: HGBA1C    Assessment & Plan:   Problem List Items Addressed This Visit       Other   Iron deficiency anemia - Primary    Feels significantly better since 2 units of PRBC 1 week ago in the ER. She saw hematology yesterday, and is going from this appointment to get blood work done at the [facility]. This will include follow-up CBC. Will have her follow-up in 6-8 weeks after next visit with hematology.         No orders of the defined types were placed in this encounter.   Follow-up: Return in about 6 weeks (around 06/04/2021) for anemia.    Charyl Dancer, NP

## 2021-04-23 NOTE — Assessment & Plan Note (Signed)
Feels significantly better since 2 units of PRBC 1 week ago in the ER. She saw hematology yesterday, and is going from this appointment to get blood work done at the [facility]. This will include follow-up CBC. Will have her follow-up in 6-8 weeks after next visit with hematology.

## 2021-04-24 LAB — ERYTHROPOIETIN: Erythropoietin: 75.1 m[IU]/mL — ABNORMAL HIGH (ref 2.6–18.5)

## 2021-04-24 LAB — HAPTOGLOBIN: Haptoglobin: 203 mg/dL (ref 37–355)

## 2021-04-25 LAB — ANA W/REFLEX: Anti Nuclear Antibody (ANA): NEGATIVE

## 2021-04-26 ENCOUNTER — Encounter: Payer: Self-pay | Admitting: Nurse Practitioner

## 2021-04-26 LAB — PROTEIN ELECTROPHORESIS, SERUM
A/G Ratio: 1.2 (ref 0.7–1.7)
Albumin ELP: 3.7 g/dL (ref 2.9–4.4)
Alpha-1-Globulin: 0.3 g/dL (ref 0.0–0.4)
Alpha-2-Globulin: 0.9 g/dL (ref 0.4–1.0)
Beta Globulin: 1.2 g/dL (ref 0.7–1.3)
Gamma Globulin: 0.6 g/dL (ref 0.4–1.8)
Globulin, Total: 3 g/dL (ref 2.2–3.9)
Total Protein ELP: 6.7 g/dL (ref 6.0–8.5)

## 2021-04-26 NOTE — Progress Notes (Signed)
[place] at [facility] [address] [place], [place] [address] [phone] (phone) [phone] (fax)  Clinic Day:  04/26/2021  Referring physician: Venita Lick, NP   CHIEF COMPLAINT:  CC: Anemia  HISTORY OF PRESENT ILLNESS:  Mary Irwin is a 70 y.o. female with a history of a fib, on eliquis, Irritable Bowel Syndrome, GERD, anemia, hypertension, who is referred in consultation with Marnee Guarneri, NP for assessment and management of anemia.  Patient presented to ER on 04/16/2021 after being found to have hemoglobin of 7.  She received 2 units PRBCs.  She has not required blood transfusion since 2018 prior to 2022.   - CSY: 05/2017 - diverticulosis in sigmoid and distal descending colon, redundant colon, non-thrombosed external hemorrhoids - EGD: 07/2017 - Z-line regular with GEJ biopsies showing reflux gastroesophagitis, normal stomach and duodenum.  - VCE: 03/2018 - small duodenal AVM, nonbleeding but superficial, 2 small submucosal varicosities and jejunum without evidence of bleeding  Today, she reports generalized fatigue which is improved, some shortness of breath.  She denies chest pain, fever, chills, night sweats, hematemesis, hemoptysis.  Denies black or bloody stools.  No abdominal pain.  No abnormal bleeding or bruising.  Says that she has previously required blood transfusion.  She takes oral ferrous gluconate daily per GI.  Managed by [facility] gastro[facility]. She reports stress recently caring for her elderly mother who has dementia.    REVIEW OF SYSTEMS:  Review of Systems  Constitutional:  Negative for appetite change, fatigue and unexpected weight change.  HENT:   Negative for mouth sores, sore throat and trouble swallowing.   Respiratory:  Negative for chest tightness, hemoptysis and shortness of breath.   Cardiovascular:  Negative for leg swelling.  Gastrointestinal:  Negative for abdominal pain, blood in stool,  constipation, diarrhea, nausea, rectal pain and vomiting.  Genitourinary:  Negative for bladder incontinence, dysuria, pelvic pain and vaginal bleeding.   Musculoskeletal:  Positive for arthralgias. Negative for back pain, flank pain, myalgias and neck stiffness.  Skin:  Negative for itching, rash and wound.  Neurological:  Negative for dizziness, headaches, light-headedness and numbness.  Hematological:  Negative for adenopathy. Bruises/bleeds easily.  Psychiatric/Behavioral:  Positive for depression and sleep disturbance. Negative for confusion. The patient is nervous/anxious.     VITALS:  There were no vitals taken for this visit.  Wt Readings from Last 3 Encounters:  04/23/21 201 lb 9.6 oz (91.4 kg)  04/16/21 200 lb (90.7 kg)  04/16/21 200 lb (90.7 kg)    There is no height or weight on file to calculate BMI.  Performance status (ECOG): 1 - Symptomatic but completely ambulatory  PHYSICAL EXAM:  Physical Exam Vitals and nursing note reviewed.  Constitutional:      Appearance: She is not ill-appearing.  HENT:     Head: Normocephalic.  Eyes:     General: No scleral icterus.    Conjunctiva/sclera: Conjunctivae normal.  Cardiovascular:     Rate and Rhythm: Normal rate and regular rhythm.     Pulses: Normal pulses.  Pulmonary:     Effort: Pulmonary effort is normal. No respiratory distress.     Breath sounds: Normal breath sounds.  Abdominal:     General: There is no distension.     Tenderness: There is no abdominal tenderness. There is no guarding.  Musculoskeletal:        General: No swelling or deformity.     Cervical back: Neck supple.  Lymphadenopathy:  Cervical: No cervical adenopathy.  Skin:    General: Skin is warm and dry.     Coloration: Skin is not pale.     Findings: No rash.  Neurological:     Mental Status: She is alert and oriented to person, place, and time.  Psychiatric:        Behavior: Behavior normal.        Thought Content: Thought content  normal.        Judgment: Judgment normal.    LABS:   CBC Latest Ref Rng & Units 04/23/2021 04/16/2021 04/16/2021  WBC 4.0 - 10.5 K/uL 8.4 9.3 8.5  Hemoglobin 12.0 - 15.0 g/dL 10.0(L) 6.5(L) 7.1(L)  Hematocrit 36.0 - 46.0 % 34.7(L) 24.1(L) 25.4(L)  Platelets 150 - 400 K/uL 454(H) 480(H) 490(H)   CMP Latest Ref Rng & Units 04/23/2021 04/16/2021 04/16/2021  Glucose 70 - 99 mg/dL 103(H) 104(H) 99  BUN 8 - 23 mg/dL 12 11 12   Creatinine 0.44 - 1.00 mg/dL 0.73 0.73 0.80  Sodium 135 - 145 mmol/L 138 143 141  Potassium 3.5 - 5.1 mmol/L 4.0 3.8 3.7  Chloride 98 - 111 mmol/L 103 105 110  CO2 22 - 32 mmol/L 24 16(L) 23  Calcium 8.9 - 10.3 mg/dL 9.4 9.5 9.1  Total Protein 6.5 - 8.1 g/dL 7.4 6.9 7.1  Total Bilirubin 0.3 - 1.2 mg/dL 0.4 <0.2 0.6  Alkaline Phos 38 - 126 U/L 95 115 78  AST 15 - 41 U/L 38 22 25  ALT 0 - 44 U/L 25 16 17      No results found for: CEA1 / No results found for: CEA1 No results found for: PSA1 No results found for: GYI948 No results found for: CAN125  No results found for: TOTALPROTELP, ALBUMINELP, A1GS, A2GS, BETS, BETA2SER, GAMS, MSPIKE, SPEI Lab Results  Component Value Date   TIBC 626 (H) 04/23/2021   TIBC 541 (H) 04/16/2021   TIBC 594 (H) 06/01/2017   FERRITIN 12 04/23/2021   FERRITIN 7 (L) 04/16/2021   FERRITIN 4 (L) 06/01/2017   IRONPCTSAT 4 (L) 04/23/2021   IRONPCTSAT 2 (LL) 04/16/2021   IRONPCTSAT 2 (L) 06/01/2017   Lab Results  Component Value Date   LDH 152 04/23/2021    STUDIES:  No results found.    HISTORY:   Past Medical History:  Diagnosis Date   Anemia    on iron supplements   Arthritis    all over   Asthma    once a week   Atrial fibrillation (HCC)    Dysrhythmia    Atrial Fibrillation   Fibromyalgia    GERD (gastroesophageal reflux disease)    Hypertension    Hypothyroidism    no meds   IBS (irritable bowel syndrome)    PONV (postoperative nausea and vomiting)    Sinus trouble     Past Surgical History:  Procedure  Laterality Date   BACK SURGERY     CATARACT EXTRACTION W/PHACO Right 04/22/2019   Procedure: CATARACT EXTRACTION PHACO AND INTRAOCULAR LENS PLACEMENT ([place]) RIGHT;  Surgeon: Eulogio Bear, MD;  Location: [place];  Service: Ophthalmology;  Laterality: Right;   CATARACT EXTRACTION W/PHACO Left 05/27/2019   Procedure: CATARACT EXTRACTION PHACO AND INTRAOCULAR LENS PLACEMENT ([place])  LEFT;  Surgeon: Eulogio Bear, MD;  Location: [place];  Service: Ophthalmology;  Laterality: Left;   CHOLECYSTECTOMY N/A 09/22/2017   Procedure: LAPAROSCOPIC CHOLECYSTECTOMY WITH INTRAOPERATIVE CHOLANGIOGRAM;  Surgeon: Olean Ree, MD;  Location: [facility];  Service: General;  Laterality: N/A;   COLONOSCOPY WITH PROPOFOL N/A 05/23/2017   Procedure: COLONOSCOPY WITH PROPOFOL;  Surgeon: Lollie Sails, MD;  Location[facility] END[facility]COPY;  Service: Endoscopy;  Laterality: N/A;   ESOPHAGOGASTRODUODENOSCOPY (EGD) WITH PROPOFOL N/A 05/23/2017   Procedure: ESOPHAGOGASTRODUODENOSCOPY (EGD) WITH PROPOFOL;  Surgeon: Lollie Sails, MD;  Location: [facility] ENDOSCOPY;  Service: Endoscopy;  Laterality: N/A;   ESOPHAGOGASTRODUODENOSCOPY (EGD) WITH PROPOFOL N/A 07/31/2017   Procedure: ESOPHAGOGASTRODUODENOSCOPY (EGD) WITH PROPOFOL;  Surgeon: Lollie Sails, MD;  Location: [facility] ENDOSCOPY;  Service: Endoscopy;  Laterality: N/A;   HERNIA REPAIR     umbilical   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   JOINT REPLACEMENT Bilateral    total hip replacement    Family History  Problem Relation Age of Onset   Breast cancer Maternal Aunt 46   Hypertension Mother     Social History:  reports that she has never smoked. She has never used smokeless tobacco. She reports that she does not drink alcohol and does not use drugs.The patient is alone today.  Allergies:  Allergies  Allergen Reactions   Lisinopril Swelling    After further review this may have been caused by Reglan   Metoclopramide  Swelling    Current Medications: Current Outpatient Medications  Medication Sig Dispense Refill   albuterol (PROVENTIL HFA;VENTOLIN HFA) 108 (90 Base) MCG/ACT inhaler Inhale 2 puffs into the lungs every 6 (six) hours as needed for wheezing or shortness of breath.     amitriptyline (ELAVIL) 10 MG tablet Take 10 mg by mouth at bedtime.     apixaban (ELIQUIS) 5 MG TABS tablet Take 5 mg by mouth 2 (two) times daily. Am and bedtime     Cholecalciferol (VITAMIN D3) 1.25 MG (50000 UT) CAPS TAKE ONE CAPSULE BY MOUTH ONCE A WEEK FOR 8 WEEKS AND THEN STOP. RETURN TO OFFICE FOR LAB DRAW. 12 capsule 4   diltiazem (TIAZAC) 300 MG 24 hr capsule Take 300 mg by mouth daily.     fluticasone furoate-vilanterol (BREO ELLIPTA) 200-25 MCG/INH AEPB Inhale 1 puff into the lungs daily. am     hydrochlorothiazide (HYDRODIURIL) 25 MG tablet Take 25 mg by mouth daily. am     pantoprazole (PROTONIX) 40 MG tablet Take 40 mg by mouth 2 (two) times daily. Am and bedtime     rosuvastatin (CRESTOR) 10 MG tablet Take 1 tablet (10 mg total) by mouth daily. 90 tablet 4   No current facility-administered medications for this visit.     ASSESSMENT & PLAN:   Assessment:  TANVI GATLING is a 70 y.o. female who presents in consultation from [facility] for anemia s/p transfusion 2 units pRBCs in ER.   Symptomatic anemia- etiology unclear though I suspect related to her hx of IBS and Eliquis. Will check anemia labs today: hemoglobin improved, persistent microcytosis. Hemoglobin previously 6.5, now 10. Platelets elevated at 454, suspect reactive. Sed rate elevated. Recommend GI evaluation for anemia. Goal hemoglobin > 8.  Iron deficiency anemia- ferritin improved to 12. Saturation ratio 4%, TIBC elevated at 626. Labs pending at time of dictation but I would recommend IV iron for rapid replenishment of her iron stores. I've reached out to the patient to discuss.  B12 deficiency- b12 170. No known malabsorption issues. Start oral  1000 mcg once daily. Plan to recheck in 6 months. If insignificant improvement, start injections.   Plan: RTC in 2-4 weeks for discuss results or sooner if symptoms persist or worsen.   I  discussed the assessment and treatment plan with the patient.  The patient was provided an opportunity to ask questions and all were answered.  The patient agreed with the plan and demonstrated an understanding of the instructions.  The patient was advised to call back if the symptoms worsen or if the condition fails to improve as anticipated.  Thank you for the opportunity to participate in the care of this pleasant patient.   I provided 45 minutes of face-to-face time during this this encounter and > 50% was spent counseling as documented under my assessment and plan.   Verlon Au, NP  CC: Loney Hering, PA ([facility]), Marnee Guarneri, NP

## 2021-04-27 LAB — HGB FRACTIONATION CASCADE
Hgb A2: 2 % (ref 1.8–3.2)
Hgb A: 98 % (ref 96.4–98.8)
Hgb F: 0 % (ref 0.0–2.0)
Hgb S: 0 %

## 2021-04-29 ENCOUNTER — Encounter: Payer: Self-pay | Admitting: Nurse Practitioner

## 2021-04-30 ENCOUNTER — Telehealth: Payer: Self-pay

## 2021-04-30 ENCOUNTER — Other Ambulatory Visit: Payer: Self-pay

## 2021-04-30 DIAGNOSIS — D509 Iron deficiency anemia, unspecified: Secondary | ICD-10-CM | POA: Diagnosis not present

## 2021-04-30 DIAGNOSIS — M2550 Pain in unspecified joint: Secondary | ICD-10-CM | POA: Diagnosis not present

## 2021-04-30 DIAGNOSIS — E039 Hypothyroidism, unspecified: Secondary | ICD-10-CM | POA: Diagnosis not present

## 2021-04-30 DIAGNOSIS — I10 Essential (primary) hypertension: Secondary | ICD-10-CM | POA: Diagnosis not present

## 2021-04-30 DIAGNOSIS — K219 Gastro-esophageal reflux disease without esophagitis: Secondary | ICD-10-CM | POA: Diagnosis not present

## 2021-04-30 DIAGNOSIS — E538 Deficiency of other specified B group vitamins: Secondary | ICD-10-CM | POA: Diagnosis not present

## 2021-04-30 DIAGNOSIS — D649 Anemia, unspecified: Secondary | ICD-10-CM

## 2021-04-30 DIAGNOSIS — R0602 Shortness of breath: Secondary | ICD-10-CM | POA: Diagnosis not present

## 2021-04-30 DIAGNOSIS — I4891 Unspecified atrial fibrillation: Secondary | ICD-10-CM | POA: Diagnosis not present

## 2021-04-30 DIAGNOSIS — K589 Irritable bowel syndrome without diarrhea: Secondary | ICD-10-CM | POA: Diagnosis not present

## 2021-04-30 DIAGNOSIS — R5383 Other fatigue: Secondary | ICD-10-CM | POA: Diagnosis not present

## 2021-04-30 LAB — OCCULT BLOOD X 1 CARD TO LAB, STOOL
Fecal Occult Bld: POSITIVE — AB
Fecal Occult Bld: POSITIVE — AB
Fecal Occult Bld: POSITIVE — AB

## 2021-04-30 NOTE — Telephone Encounter (Signed)
Informed pt of blood being present in stool per Lauren. Pt needs to be seen by GI. Pt stated she sees a GI specialist over at [facility] and she will contact them in order to set up an appt to have the issue taken care of. Pt understood results and instructions.

## 2021-05-19 ENCOUNTER — Other Ambulatory Visit: Payer: Self-pay | Admitting: *Deleted

## 2021-05-19 DIAGNOSIS — D649 Anemia, unspecified: Secondary | ICD-10-CM

## 2021-05-19 DIAGNOSIS — R110 Nausea: Secondary | ICD-10-CM | POA: Diagnosis not present

## 2021-05-19 DIAGNOSIS — D509 Iron deficiency anemia, unspecified: Secondary | ICD-10-CM | POA: Diagnosis not present

## 2021-05-20 ENCOUNTER — Inpatient Hospital Stay (HOSPITAL_BASED_OUTPATIENT_CLINIC_OR_DEPARTMENT_OTHER): Payer: Medicare HMO | Admitting: Nurse Practitioner

## 2021-05-20 ENCOUNTER — Inpatient Hospital Stay: Payer: Medicare HMO

## 2021-05-20 ENCOUNTER — Encounter: Payer: Self-pay | Admitting: Nurse Practitioner

## 2021-05-20 ENCOUNTER — Inpatient Hospital Stay: Payer: Medicare HMO | Attending: Nurse Practitioner

## 2021-05-20 VITALS — BP 154/77 | HR 89 | Temp 100.3°F | Resp 16 | Wt 200.8 lb

## 2021-05-20 DIAGNOSIS — K219 Gastro-esophageal reflux disease without esophagitis: Secondary | ICD-10-CM | POA: Diagnosis not present

## 2021-05-20 DIAGNOSIS — Z803 Family history of malignant neoplasm of breast: Secondary | ICD-10-CM | POA: Diagnosis not present

## 2021-05-20 DIAGNOSIS — D509 Iron deficiency anemia, unspecified: Secondary | ICD-10-CM

## 2021-05-20 DIAGNOSIS — E538 Deficiency of other specified B group vitamins: Secondary | ICD-10-CM | POA: Diagnosis not present

## 2021-05-20 DIAGNOSIS — Z8249 Family history of ischemic heart disease and other diseases of the circulatory system: Secondary | ICD-10-CM | POA: Diagnosis not present

## 2021-05-20 DIAGNOSIS — D649 Anemia, unspecified: Secondary | ICD-10-CM

## 2021-05-20 DIAGNOSIS — R5383 Other fatigue: Secondary | ICD-10-CM | POA: Insufficient documentation

## 2021-05-20 DIAGNOSIS — K589 Irritable bowel syndrome without diarrhea: Secondary | ICD-10-CM | POA: Insufficient documentation

## 2021-05-20 DIAGNOSIS — D519 Vitamin B12 deficiency anemia, unspecified: Secondary | ICD-10-CM | POA: Diagnosis not present

## 2021-05-20 DIAGNOSIS — R69 Illness, unspecified: Secondary | ICD-10-CM | POA: Diagnosis not present

## 2021-05-20 DIAGNOSIS — Z79899 Other long term (current) drug therapy: Secondary | ICD-10-CM | POA: Diagnosis not present

## 2021-05-20 DIAGNOSIS — F32A Depression, unspecified: Secondary | ICD-10-CM | POA: Diagnosis not present

## 2021-05-20 DIAGNOSIS — I10 Essential (primary) hypertension: Secondary | ICD-10-CM | POA: Diagnosis not present

## 2021-05-20 DIAGNOSIS — I4891 Unspecified atrial fibrillation: Secondary | ICD-10-CM | POA: Diagnosis not present

## 2021-05-20 LAB — CBC WITH DIFFERENTIAL/PLATELET
Abs Immature Granulocytes: 0.03 10*3/uL (ref 0.00–0.07)
Basophils Absolute: 0.1 10*3/uL (ref 0.0–0.1)
Basophils Relative: 1 %
Eosinophils Absolute: 0.5 10*3/uL (ref 0.0–0.5)
Eosinophils Relative: 6 %
HCT: 31.2 % — ABNORMAL LOW (ref 36.0–46.0)
Hemoglobin: 9 g/dL — ABNORMAL LOW (ref 12.0–15.0)
Immature Granulocytes: 0 %
Lymphocytes Relative: 16 %
Lymphs Abs: 1.5 10*3/uL (ref 0.7–4.0)
MCH: 20.4 pg — ABNORMAL LOW (ref 26.0–34.0)
MCHC: 28.8 g/dL — ABNORMAL LOW (ref 30.0–36.0)
MCV: 70.6 fL — ABNORMAL LOW (ref 80.0–100.0)
Monocytes Absolute: 0.8 10*3/uL (ref 0.1–1.0)
Monocytes Relative: 8 %
Neutro Abs: 6.5 10*3/uL (ref 1.7–7.7)
Neutrophils Relative %: 69 %
Platelets: 392 10*3/uL (ref 150–400)
RBC: 4.42 MIL/uL (ref 3.87–5.11)
RDW: 24.7 % — ABNORMAL HIGH (ref 11.5–15.5)
WBC: 9.4 10*3/uL (ref 4.0–10.5)
nRBC: 0 % (ref 0.0–0.2)

## 2021-05-20 LAB — FERRITIN: Ferritin: 8 ng/mL — ABNORMAL LOW (ref 11–307)

## 2021-05-20 MED ORDER — CYANOCOBALAMIN 1000 MCG/ML IJ SOLN
1000.0000 ug | Freq: Once | INTRAMUSCULAR | Status: AC
Start: 2021-05-20 — End: 2021-05-20
  Administered 2021-05-20: 1000 ug via INTRAMUSCULAR
  Filled 2021-05-20: qty 1

## 2021-05-20 NOTE — Progress Notes (Signed)
Patient received recent blood transfusion at the ED on 6/10 and does have an improvement in energy but not at her baseline.  Saw GI yesterday and is scheduled for colonoscopy and EGD on 08/18/21.

## 2021-05-20 NOTE — Progress Notes (Signed)
Hematology Progress Note [place] [facility] at [facility] [address], [place], [place] [address] [phone] (phone) [phone] (fax)  Clinic Day:  05/20/2021  Referring physician: Venita Lick, NP  CHIEF COMPLAINT:  CC: Anemia  HISTORY OF PRESENTING ILLNESS:  Mary Irwin is a 70 y.o. female with a history of a fib, on eliquis, Irritable Bowel Syndrome, GERD, anemia, hypertension, who is referred in consultation with Marnee Guarneri, NP for assessment and management of anemia.  Patient presented to ER on 04/16/2021 after being found to have hemoglobin of 7.  She received 2 units PRBCs.  She has not required blood transfusion since 2018 prior to 2022.   - CSY: 05/2017 - diverticulosis in sigmoid and distal descending colon, redundant colon, non-thrombosed external hemorrhoids - EGD: 07/2017 - Z-line regular with GEJ biopsies showing reflux gastroesophagitis, normal stomach and duodenum.  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  Previously, labs showed low B12, low iron levels, positive Hemoccult stools.  In the interim she has been seen by GI and has colonoscopy planned for October.  She has not previously received IV iron.  Not currently taking oral iron.  Patient is fatigued.  Denies dizziness or weakness.  No recent fevers or illness.  No easy bleeding or bruising.  Denies melena or hematochezia.  No pica or restless leg.  Appetite is good and she denies weight loss.  No chest pain or shortness of breath.  No nausea, vomiting, constipation, or diarrhea.  Denies urinary complaints.  No further specific complaints today.  Review of Systems  Constitutional:  Positive for malaise/fatigue. Negative for chills, fever and  weight loss.  HENT:  Negative for hearing loss, nosebleeds, sore throat and tinnitus.   Eyes:  Negative for blurred vision and double vision.  Respiratory:  Negative for cough, hemoptysis, shortness of breath and wheezing.   Cardiovascular:  Negative for chest pain, palpitations and leg swelling.  Gastrointestinal:  Negative for abdominal pain, blood in stool, constipation, diarrhea, melena, nausea and vomiting.  Genitourinary:  Negative for dysuria, flank pain and urgency.  Musculoskeletal:  Negative for back pain, falls, joint pain and myalgias.  Skin:  Negative for itching and rash.  Neurological:  Negative for dizziness, tingling, sensory change, loss of consciousness, weakness and headaches.  Endo/Heme/Allergies:  Negative for environmental allergies. Does not bruise/bleed easily.  Psychiatric/Behavioral:  Positive for depression. The patient is nervous/anxious. The patient does not have insomnia.     Past Medical History:  Diagnosis Date   Anemia    on iron supplements   Arthritis    all over   Asthma    once a week   Atrial fibrillation (HCC)    Dysrhythmia    Atrial Fibrillation   Fibromyalgia    GERD (gastroesophageal reflux disease)    Hypertension    Hypothyroidism    no meds   IBS (irritable bowel syndrome)    PONV (postoperative nausea and vomiting)    Sinus trouble      Past Surgical History:  Procedure Laterality Date   BACK SURGERY     CATARACT EXTRACTION W/PHACO Right 04/22/2019   Procedure: CATARACT EXTRACTION PHACO AND INTRAOCULAR LENS PLACEMENT ([place]) RIGHT;  Surgeon: Eulogio Bear, MD;  Location: [place];  Service: Ophthalmology;  Laterality:  Right;   CATARACT EXTRACTION W/PHACO Left 05/27/2019   Procedure: CATARACT EXTRACTION PHACO AND INTRAOCULAR LENS PLACEMENT ([place])  LEFT;  Surgeon: Eulogio Bear, MD;  Location: [place];  Service: Ophthalmology;  Laterality: Left;   CHOLECYSTECTOMY N/A 09/22/2017   Procedure:  LAPAROSCOPIC CHOLECYSTECTOMY WITH INTRAOPERATIVE CHOLANGIOGRAM;  Surgeon: Olean Ree, MD;  Location: [facility];  Service: General;  Laterality: N/A;   COLONOSCOPY WITH PROPOFOL N/A 05/23/2017   Procedure: COLONOSCOPY WITH PROPOFOL;  Surgeon: Lollie Sails, MD;  Location: [facility] ENDOSCOPY;  Service: Endoscopy;  Laterality: N/A;   ESOPHAGOGASTRODUODENOSCOPY (EGD) WITH PROPOFOL N/A 05/23/2017   Procedure: ESOPHAGOGASTRODUODENOSCOPY (EGD) WITH PROPOFOL;  Surgeon: Lollie Sails, MD;  Location: [facility] ENDOSCOPY;  Service: Endoscopy;  Laterality: N/A;   ESOPHAGOGASTRODUODENOSCOPY (EGD) WITH PROPOFOL N/A 07/31/2017   Procedure: ESOPHAGOGASTRODUODENOSCOPY (EGD) WITH PROPOFOL;  Surgeon: Lollie Sails, MD;  Location: [facility] ENDOSCOPY;  Service: Endoscopy;  Laterality: N/A;   HERNIA REPAIR     umbilical   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   JOINT REPLACEMENT Bilateral    total hip replacement    Family History  Problem Relation Age of Onset   Breast cancer Maternal Aunt 60   Hypertension Mother     Social History   Socioeconomic History   Marital status: Divorced    Spouse name: Not on file   Number of children: Not on file   Years of education: Not on file   Highest education level: Not on file  Occupational History   Not on file  Tobacco Use   Smoking status: Never   Smokeless tobacco: Never  Vaping Use   Vaping Use: Never used  Substance and Sexual Activity   Alcohol use: No   Drug use: No   Sexual activity: Not on file  Other Topics Concern   Not on file  Social History Narrative   Not on file   Social Determinants of Health   Financial Resource Strain: Not on file  Food Insecurity: Not on file  Transportation Needs: Not on file  Physical Activity: Not on file  Stress: Not on file  Social Connections: Not on file    Allergies  Allergen Reactions   Lisinopril Swelling    After further review this may have been caused by Reglan    Metoclopramide Swelling    Current Outpatient Medications on File Prior to Visit  Medication Sig Dispense Refill   albuterol (PROVENTIL HFA;VENTOLIN HFA) 108 (90 Base) MCG/ACT inhaler Inhale 2 puffs into the lungs every 6 (six) hours as needed for wheezing or shortness of breath.     amitriptyline (ELAVIL) 10 MG tablet Take 10 mg by mouth at bedtime.     apixaban (ELIQUIS) 5 MG TABS tablet Take 5 mg by mouth 2 (two) times daily. Am and bedtime     Cholecalciferol (VITAMIN D3) 1.25 MG (50000 UT) CAPS TAKE ONE CAPSULE BY MOUTH ONCE A WEEK FOR 8 WEEKS AND THEN STOP. RETURN TO OFFICE FOR LAB DRAW. 12 capsule 4   diltiazem (TIAZAC) 300 MG 24 hr capsule Take 300 mg by mouth daily.     fluticasone furoate-vilanterol (BREO ELLIPTA) 200-25 MCG/INH AEPB Inhale 1 puff into the lungs daily. am     hydrochlorothiazide (HYDRODIURIL) 25 MG tablet Take 25 mg by mouth daily. am     pantoprazole (PROTONIX) 40 MG tablet Take 40 mg by mouth 2 (two) times daily. Am and bedtime     rosuvastatin (CRESTOR) 10 MG tablet Take 1  tablet (10 mg total) by mouth daily. (Patient not taking: Reported on 05/20/2021) 90 tablet 4   No current facility-administered medications on file prior to visit.    VITALS:  Blood pressure (!) 154/77, pulse 89, temperature 100.3 F (37.9 C), resp. rate 16, weight 200 lb 12.8 oz (91.1 kg).  Wt Readings from Last 3 Encounters:  05/20/21 200 lb 12.8 oz (91.1 kg)  04/23/21 201 lb 9.6 oz (91.4 kg)  04/16/21 200 lb (90.7 kg)    Body mass index is 34.28 kg/m.  Performance status (ECOG): 1 - Symptomatic but completely ambulatory  PHYSICAL EXAM:  Physical Exam Vitals and nursing note reviewed.  Constitutional:      Appearance: She is not ill-appearing.  HENT:     Head: Normocephalic.  Eyes:     General: No scleral icterus.    Conjunctiva/sclera: Conjunctivae normal.  Cardiovascular:     Rate and Rhythm: Normal rate and regular rhythm.     Pulses: Normal pulses.  Pulmonary:      Effort: Pulmonary effort is normal. No respiratory distress.     Breath sounds: Normal breath sounds.  Abdominal:     General: There is no distension.     Tenderness: There is no abdominal tenderness. There is no guarding.  Musculoskeletal:        General: No swelling or deformity.     Cervical back: Neck supple.  Lymphadenopathy:     Cervical: No cervical adenopathy.  Skin:    General: Skin is warm and dry.     Coloration: Skin is not pale.     Findings: No rash.  Neurological:     Mental Status: She is alert and oriented to person, place, and time.  Psychiatric:        Behavior: Behavior normal.        Thought Content: Thought content normal.        Judgment: Judgment normal.    LABS:   CBC Latest Ref Rng & Units 05/20/2021 04/23/2021 04/16/2021  WBC 4.0 - 10.5 K/uL 9.4 8.4 9.3  Hemoglobin 12.0 - 15.0 g/dL 9.0(L) 10.0(L) 6.5(L)  Hematocrit 36.0 - 46.0 % 31.2(L) 34.7(L) 24.1(L)  Platelets 150 - 400 K/uL 392 454(H) 480(H)   CMP Latest Ref Rng & Units 04/23/2021 04/16/2021 04/16/2021  Glucose 70 - 99 mg/dL 103(H) 104(H) 99  BUN 8 - 23 mg/dL 12 11 12   Creatinine 0.44 - 1.00 mg/dL 0.73 0.73 0.80  Sodium 135 - 145 mmol/L 138 143 141  Potassium 3.5 - 5.1 mmol/L 4.0 3.8 3.7  Chloride 98 - 111 mmol/L 103 105 110  CO2 22 - 32 mmol/L 24 16(L) 23  Calcium 8.9 - 10.3 mg/dL 9.4 9.5 9.1  Total Protein 6.5 - 8.1 g/dL 7.4 6.9 7.1  Total Bilirubin 0.3 - 1.2 mg/dL 0.4 <0.2 0.6  Alkaline Phos 38 - 126 U/L 95 115 78  AST 15 - 41 U/L 38 22 25  ALT 0 - 44 U/L 25 16 17    No results found for: CEA1 / No results found for: CEA1 No results found for: PSA1 No results found for: CAN199 No results found for: [id number]  Lab Results  Component Value Date   TOTALPROTELP 6.7 04/23/2021   ALBUMINELP 3.7 04/23/2021   A1GS 0.3 04/23/2021   A2GS 0.9 04/23/2021   BETS 1.2 04/23/2021   GAMS 0.6 04/23/2021   MSPIKE Not Observed 04/23/2021   SPEI Comment 04/23/2021   Lab Results  Component Value Date    TIBC 626 (  H) 04/23/2021   TIBC 541 (H) 04/16/2021   TIBC 594 (H) 06/01/2017   FERRITIN 12 04/23/2021   FERRITIN 7 (L) 04/16/2021   FERRITIN 4 (L) 06/01/2017   IRONPCTSAT 4 (L) 04/23/2021   IRONPCTSAT 2 (LL) 04/16/2021   IRONPCTSAT 2 (L) 06/01/2017   Lab Results  Component Value Date   LDH 152 04/23/2021    STUDIES:  No results found.     ASSESSMENT & PLAN:   Assessment:  ZELPHA MESSING is a 70 y.o. female who returns to clinic for evaluation of anemia.   Iron deficiency anemia- Hemoglobin decreased to 9 today, previously 10. Ferritin 12, iron saturation ratio 4%, TIBC elevated at 626. Suspect GI etiology based on positive hemoccult stools. Currently awaiting colonoscopy with GI. I recommend venofer based on her insurance x 5. Risks vs benefits and alternatives reviewed in detail. Patient wishes to proceed with IV iron.  B12 deficiency- B12 deficiency- b12 170. No known malabsorption issues. Prefers injections to oral supplementation. Will start monthly b12 1000 mcg every 4 weeks. Plan to recheck in December 2022.    Plan: Venofer x 5 B12 monthly RTC in 8 weeks for labs (cbc, ferritin, iron studies), few days later MD to establish care, possible venofer.   I discussed the assessment and treatment plan with the patient.  The patient was provided an opportunity to ask questions and all were answered.  The patient agreed with the plan and demonstrated an understanding of the instructions.  The patient was advised to call back if the symptoms worsen or if the condition fails to improve as anticipated.  Thank you for the opportunity to participate in the care of this pleasant patient.   Verlon Au, NP

## 2021-05-21 ENCOUNTER — Other Ambulatory Visit: Payer: Self-pay

## 2021-05-21 ENCOUNTER — Inpatient Hospital Stay: Payer: Medicare HMO

## 2021-05-21 VITALS — BP 151/78 | HR 91 | Temp 96.6°F | Resp 16

## 2021-05-21 DIAGNOSIS — Z79899 Other long term (current) drug therapy: Secondary | ICD-10-CM | POA: Diagnosis not present

## 2021-05-21 DIAGNOSIS — K589 Irritable bowel syndrome without diarrhea: Secondary | ICD-10-CM | POA: Diagnosis not present

## 2021-05-21 DIAGNOSIS — K219 Gastro-esophageal reflux disease without esophagitis: Secondary | ICD-10-CM | POA: Diagnosis not present

## 2021-05-21 DIAGNOSIS — D509 Iron deficiency anemia, unspecified: Secondary | ICD-10-CM

## 2021-05-21 DIAGNOSIS — I10 Essential (primary) hypertension: Secondary | ICD-10-CM | POA: Diagnosis not present

## 2021-05-21 DIAGNOSIS — R5383 Other fatigue: Secondary | ICD-10-CM | POA: Diagnosis not present

## 2021-05-21 DIAGNOSIS — R69 Illness, unspecified: Secondary | ICD-10-CM | POA: Diagnosis not present

## 2021-05-21 DIAGNOSIS — E538 Deficiency of other specified B group vitamins: Secondary | ICD-10-CM | POA: Diagnosis not present

## 2021-05-21 DIAGNOSIS — Z803 Family history of malignant neoplasm of breast: Secondary | ICD-10-CM | POA: Diagnosis not present

## 2021-05-21 DIAGNOSIS — I4891 Unspecified atrial fibrillation: Secondary | ICD-10-CM | POA: Diagnosis not present

## 2021-05-21 MED ORDER — SODIUM CHLORIDE 0.9 % IV SOLN
Freq: Once | INTRAVENOUS | Status: AC
  Filled 2021-05-21: qty 250

## 2021-05-21 MED ORDER — IRON SUCROSE 20 MG/ML IV SOLN
200.0000 mg | Freq: Once | INTRAVENOUS | Status: AC
  Administered 2021-05-21: 200 mg via INTRAVENOUS
  Filled 2021-05-21: qty 10

## 2021-05-21 MED ORDER — SODIUM CHLORIDE 0.9 % IV SOLN: 200.0000 mg | Freq: Once | INTRAVENOUS | Status: DC

## 2021-05-21 NOTE — Patient Instructions (Signed)

## 2021-05-28 ENCOUNTER — Inpatient Hospital Stay: Payer: Medicare HMO

## 2021-05-28 VITALS — BP 146/55 | HR 86 | Resp 16

## 2021-05-28 DIAGNOSIS — I4891 Unspecified atrial fibrillation: Secondary | ICD-10-CM | POA: Diagnosis not present

## 2021-05-28 DIAGNOSIS — D509 Iron deficiency anemia, unspecified: Secondary | ICD-10-CM

## 2021-05-28 DIAGNOSIS — R69 Illness, unspecified: Secondary | ICD-10-CM | POA: Diagnosis not present

## 2021-05-28 DIAGNOSIS — E538 Deficiency of other specified B group vitamins: Secondary | ICD-10-CM | POA: Diagnosis not present

## 2021-05-28 DIAGNOSIS — I10 Essential (primary) hypertension: Secondary | ICD-10-CM | POA: Diagnosis not present

## 2021-05-28 DIAGNOSIS — K589 Irritable bowel syndrome without diarrhea: Secondary | ICD-10-CM | POA: Diagnosis not present

## 2021-05-28 DIAGNOSIS — R5383 Other fatigue: Secondary | ICD-10-CM | POA: Diagnosis not present

## 2021-05-28 DIAGNOSIS — K219 Gastro-esophageal reflux disease without esophagitis: Secondary | ICD-10-CM | POA: Diagnosis not present

## 2021-05-28 DIAGNOSIS — Z803 Family history of malignant neoplasm of breast: Secondary | ICD-10-CM | POA: Diagnosis not present

## 2021-05-28 DIAGNOSIS — Z79899 Other long term (current) drug therapy: Secondary | ICD-10-CM | POA: Diagnosis not present

## 2021-05-28 MED ORDER — SODIUM CHLORIDE 0.9 % IV SOLN
Freq: Once | INTRAVENOUS | Status: AC
  Filled 2021-05-28: qty 250

## 2021-05-28 MED ORDER — SODIUM CHLORIDE 0.9 % IV SOLN: 200.0000 mg | Freq: Once | INTRAVENOUS | Status: DC

## 2021-05-28 MED ORDER — IRON SUCROSE 20 MG/ML IV SOLN
200.0000 mg | Freq: Once | INTRAVENOUS | Status: AC
  Administered 2021-05-28: 200 mg via INTRAVENOUS
  Filled 2021-05-28: qty 10

## 2021-05-28 NOTE — Patient Instructions (Signed)

## 2021-06-04 ENCOUNTER — Other Ambulatory Visit: Payer: Self-pay

## 2021-06-04 ENCOUNTER — Inpatient Hospital Stay: Payer: Medicare HMO

## 2021-06-04 ENCOUNTER — Ambulatory Visit: Payer: Medicare HMO | Admitting: Nurse Practitioner

## 2021-06-04 VITALS — BP 164/70 | HR 87 | Temp 97.1°F | Resp 18

## 2021-06-04 DIAGNOSIS — R69 Illness, unspecified: Secondary | ICD-10-CM | POA: Diagnosis not present

## 2021-06-04 DIAGNOSIS — E538 Deficiency of other specified B group vitamins: Secondary | ICD-10-CM | POA: Diagnosis not present

## 2021-06-04 DIAGNOSIS — R5383 Other fatigue: Secondary | ICD-10-CM | POA: Diagnosis not present

## 2021-06-04 DIAGNOSIS — I10 Essential (primary) hypertension: Secondary | ICD-10-CM | POA: Diagnosis not present

## 2021-06-04 DIAGNOSIS — Z79899 Other long term (current) drug therapy: Secondary | ICD-10-CM | POA: Diagnosis not present

## 2021-06-04 DIAGNOSIS — K589 Irritable bowel syndrome without diarrhea: Secondary | ICD-10-CM | POA: Diagnosis not present

## 2021-06-04 DIAGNOSIS — Z803 Family history of malignant neoplasm of breast: Secondary | ICD-10-CM | POA: Diagnosis not present

## 2021-06-04 DIAGNOSIS — D509 Iron deficiency anemia, unspecified: Secondary | ICD-10-CM | POA: Diagnosis not present

## 2021-06-04 DIAGNOSIS — I4891 Unspecified atrial fibrillation: Secondary | ICD-10-CM | POA: Diagnosis not present

## 2021-06-04 DIAGNOSIS — K219 Gastro-esophageal reflux disease without esophagitis: Secondary | ICD-10-CM | POA: Diagnosis not present

## 2021-06-04 MED ORDER — IRON SUCROSE 20 MG/ML IV SOLN
200.0000 mg | Freq: Once | INTRAVENOUS | Status: AC
  Administered 2021-06-04: 200 mg via INTRAVENOUS
  Filled 2021-06-04: qty 10

## 2021-06-04 MED ORDER — SODIUM CHLORIDE 0.9 % IV SOLN: 200.0000 mg | Freq: Once | INTRAVENOUS | Status: DC

## 2021-06-04 MED ORDER — SODIUM CHLORIDE 0.9 % IV SOLN
Freq: Once | INTRAVENOUS | Status: AC
  Filled 2021-06-04: qty 250

## 2021-06-04 NOTE — Patient Instructions (Signed)
[facility]   Discharge Instructions: Thank you for choosing [facility] to provide your oncology and hematology care.  If you have a lab appointment with the [facility], please go directly to the [facility] and check in at the registration area.  Wear comfortable clothing and clothing appropriate for easy access to any Portacath or PICC line.   We strive to give you quality time with your provider. You may need to reschedule your appointment if you arrive late (15 or more minutes).  Arriving late affects you and other patients whose appointments are after yours.  Also, if you miss three or more appointments without notifying the office, you may be dismissed from the clinic at the provider's discretion.      For prescription refill requests, have your pharmacy contact our office and allow 72 hours for refills to be completed.    BELOW ARE SYMPTOMS THAT SHOULD BE REPORTED IMMEDIATELY: *FEVER GREATER THAN 100.4 F (38 C) OR HIGHER *CHILLS OR SWEATING *NAUSEA AND VOMITING THAT IS NOT CONTROLLED WITH YOUR NAUSEA MEDICATION *UNUSUAL SHORTNESS OF BREATH *UNUSUAL BRUISING OR BLEEDING *URINARY PROBLEMS (pain or burning when urinating, or frequent urination) *BOWEL PROBLEMS (unusual diarrhea, constipation, pain near the anus) TENDERNESS IN MOUTH AND THROAT WITH OR WITHOUT PRESENCE OF ULCERS (sore throat, sores in mouth, or a toothache) UNUSUAL RASH, SWELLING OR PAIN  UNUSUAL VAGINAL DISCHARGE OR ITCHING   Items with * indicate a potential emergency and should be followed up as soon as possible or go to the Emergency Department if any problems should occur.  Should you have questions after your visit or need to cancel or reschedule your appointment, please contact [facility]  [phone] and follow the prompts.  Office hours are 8:00 a.m. to 4:30 p.m. Monday - Friday. Please note that voicemails left after  4:00 p.m. may not be returned until the following business day.  We are closed weekends and major holidays. You have access to a nurse at all times for urgent questions. Please call the main number to the clinic [phone] and follow the prompts.  For any non-urgent questions, you may also contact your provider using MyChart. We now offer e-Visits for anyone 68 and older to request care online for non-urgent symptoms. For details visit [email or web address].   Also download the MyChart app! Go to the app store, search "MyChart", open the app, select [facility], and log in with your MyChart username and password.  Due to Covid, a mask is required upon entering the hospital/clinic. If you do not have a mask, one will be given to you upon arrival. For doctor visits, patients may have 1 support person aged 37 or older with them. For treatment visits, patients cannot have anyone with them due to current Covid guidelines and our immunocompromised population.   Iron Sucrose injection What is this medication? IRON SUCROSE (AHY ern SOO krohs) is an iron complex. Iron is used to make healthy red blood cells, which carry oxygen and nutrients throughout the body. This medicine is used to treat iron deficiency anemia in people with chronickidney disease. This medicine may be used for other purposes; ask your health care provider orpharmacist if you have questions. COMMON BRAND NAME(S): Venofer What should I tell my care team before I take this medication? They need to know if you have any of these conditions: anemia not caused by low iron levels heart disease high levels of iron in the blood  kidney disease liver disease an unusual or allergic reaction to iron, other medicines, foods, dyes, or preservatives pregnant or trying to get pregnant breast-feeding How should I use this medication? This medicine is for infusion into a vein. It is given by a health careprofessional in a hospital or clinic  setting. Talk to your pediatrician regarding the use of this medicine in children. While this drug may be prescribed for children as young as 2 years for selectedconditions, precautions do apply. Overdosage: If you think you have taken too much of this medicine contact apoison control center or emergency room at once. NOTE: This medicine is only for you. Do not share this medicine with others. What if I miss a dose? It is important not to miss your dose. Call your doctor or health careprofessional if you are unable to keep an appointment. What may interact with this medication? Do not take this medicine with any of the following medications: deferoxamine dimercaprol other iron products This medicine may also interact with the following medications: chloramphenicol deferasirox This list may not describe all possible interactions. Give your health care provider a list of all the medicines, herbs, non-prescription drugs, or dietary supplements you use. Also tell them if you smoke, drink alcohol, or use illegaldrugs. Some items may interact with your medicine. What should I watch for while using this medication? Visit your doctor or healthcare professional regularly. Tell your doctor or healthcare professional if your symptoms do not start to get better or if theyget worse. You may need blood work done while you are taking this medicine. You may need to follow a special diet. Talk to your doctor. Foods that contain iron include: whole grains/cereals, dried fruits, beans, or peas, leafy greenvegetables, and organ meats (liver, kidney). What side effects may I notice from receiving this medication? Side effects that you should report to your doctor or health care professionalas soon as possible: allergic reactions like skin rash, itching or hives, swelling of the face, lips, or tongue breathing problems changes in blood pressure cough fast, irregular heartbeat feeling faint or lightheaded,  falls fever or chills flushing, sweating, or hot feelings joint or muscle aches/pains seizures swelling of the ankles or feet unusually weak or tired Side effects that usually do not require medical attention (report to yourdoctor or health care professional if they continue or are bothersome): diarrhea feeling achy headache irritation at site where injected nausea, vomiting stomach upset tiredness This list may not describe all possible side effects. Call your doctor for medical advice about side effects. You may report side effects to FDA at1-800-FDA-1088. Where should I keep my medication? This drug is given in a hospital or clinic and will not be stored at home. NOTE: This sheet is a summary. It may not cover all possible information. If you have questions about this medicine, talk to your doctor, pharmacist, orhealth care provider.  2022 Elsevier/Gold Standard (2011-08-04 17:14:35)

## 2021-06-04 NOTE — Progress Notes (Signed)
Pt received IV venofer infusion in clinic today. Tolerated well. VSS @ d/c.

## 2021-06-11 ENCOUNTER — Inpatient Hospital Stay: Payer: Medicare HMO | Attending: Internal Medicine

## 2021-06-11 VITALS — BP 134/56 | HR 92 | Temp 97.1°F | Resp 18

## 2021-06-11 DIAGNOSIS — D500 Iron deficiency anemia secondary to blood loss (chronic): Secondary | ICD-10-CM | POA: Diagnosis not present

## 2021-06-11 DIAGNOSIS — D509 Iron deficiency anemia, unspecified: Secondary | ICD-10-CM

## 2021-06-11 DIAGNOSIS — Z79899 Other long term (current) drug therapy: Secondary | ICD-10-CM | POA: Insufficient documentation

## 2021-06-11 DIAGNOSIS — E538 Deficiency of other specified B group vitamins: Secondary | ICD-10-CM | POA: Insufficient documentation

## 2021-06-11 MED ORDER — SODIUM CHLORIDE 0.9 % IV SOLN
Freq: Once | INTRAVENOUS | Status: AC
  Filled 2021-06-11: qty 250

## 2021-06-11 MED ORDER — CYANOCOBALAMIN 1000 MCG/ML IJ SOLN
1000.0000 ug | Freq: Once | INTRAMUSCULAR | Status: AC
  Administered 2021-06-11: 1000 ug via INTRAMUSCULAR
  Filled 2021-06-11: qty 1

## 2021-06-11 MED ORDER — IRON SUCROSE 20 MG/ML IV SOLN
200.0000 mg | Freq: Once | INTRAVENOUS | Status: AC
  Administered 2021-06-11: 200 mg via INTRAVENOUS
  Filled 2021-06-11: qty 10

## 2021-06-11 MED ORDER — SODIUM CHLORIDE 0.9 % IV SOLN: 200.0000 mg | Freq: Once | INTRAVENOUS | Status: DC

## 2021-06-11 NOTE — Patient Instructions (Signed)
[facility]  Discharge Instructions: Thank you for choosing [facility] to provide your oncology and hematology care.  If you have a lab appointment with the [facility], please go directly to the [facility] and check in at the registration area.  Wear comfortable clothing and clothing appropriate for easy access to any Portacath or PICC line.   We strive to give you quality time with your provider. You may need to reschedule your appointment if you arrive late (15 or more minutes).  Arriving late affects you and other patients whose appointments are after yours.  Also, if you miss three or more appointments without notifying the office, you may be dismissed from the clinic at the provider's discretion.      For prescription refill requests, have your pharmacy contact our office and allow 72 hours for refills to be completed.    Today you received the following chemotherapy and/or immunotherapy agents venofer and vit b12      To help prevent nausea and vomiting after your treatment, we encourage you to take your nausea medication as directed.  BELOW ARE SYMPTOMS THAT SHOULD BE REPORTED IMMEDIATELY: *FEVER GREATER THAN 100.4 F (38 C) OR HIGHER *CHILLS OR SWEATING *NAUSEA AND VOMITING THAT IS NOT CONTROLLED WITH YOUR NAUSEA MEDICATION *UNUSUAL SHORTNESS OF BREATH *UNUSUAL BRUISING OR BLEEDING *URINARY PROBLEMS (pain or burning when urinating, or frequent urination) *BOWEL PROBLEMS (unusual diarrhea, constipation, pain near the anus) TENDERNESS IN MOUTH AND THROAT WITH OR WITHOUT PRESENCE OF ULCERS (sore throat, sores in mouth, or a toothache) UNUSUAL RASH, SWELLING OR PAIN  UNUSUAL VAGINAL DISCHARGE OR ITCHING   Items with * indicate a potential emergency and should be followed up as soon as possible or go to the Emergency Department if any problems should occur.  Please show the CHEMOTHERAPY ALERT CARD or IMMUNOTHERAPY ALERT CARD at  check-in to the Emergency Department and triage nurse.  Should you have questions after your visit or need to cancel or reschedule your appointment, please contact [facility]  [phone] and follow the prompts.  Office hours are 8:00 a.m. to 4:30 p.m. Monday - Friday. Please note that voicemails left after 4:00 p.m. may not be returned until the following business day.  We are closed weekends and major holidays. You have access to a nurse at all times for urgent questions. Please call the main number to the clinic [phone] and follow the prompts.  For any non-urgent questions, you may also contact your provider using MyChart. We now offer e-Visits for anyone 51 and older to request care online for non-urgent symptoms. For details visit [email or web address].   Also download the MyChart app! Go to the app store, search "MyChart", open the app, select [facility], and log in with your MyChart username and password.  Due to Covid, a mask is required upon entering the hospital/clinic. If you do not have a mask, one will be given to you upon arrival. For doctor visits, patients may have 1 support person aged 20 or older with them. For treatment visits, patients cannot have anyone with them due to current Covid guidelines and our immunocompromised population.   Iron Sucrose injection What is this medication? IRON SUCROSE (AHY ern SOO krohs) is an iron complex. Iron is used to make healthy red blood cells, which carry oxygen and nutrients throughout the body. This medicine is used to treat iron deficiency anemia in people with chronickidney disease. This medicine may be  used for other purposes; ask your health care provider orpharmacist if you have questions. COMMON BRAND NAME(S): Venofer What should I tell my care team before I take this medication? They need to know if you have any of these conditions: anemia not caused by low iron levels heart disease high  levels of iron in the blood kidney disease liver disease an unusual or allergic reaction to iron, other medicines, foods, dyes, or preservatives pregnant or trying to get pregnant breast-feeding How should I use this medication? This medicine is for infusion into a vein. It is given by a health careprofessional in a hospital or clinic setting. Talk to your pediatrician regarding the use of this medicine in children. While this drug may be prescribed for children as young as 2 years for selectedconditions, precautions do apply. Overdosage: If you think you have taken too much of this medicine contact apoison control center or emergency room at once. NOTE: This medicine is only for you. Do not share this medicine with others. What if I miss a dose? It is important not to miss your dose. Call your doctor or health careprofessional if you are unable to keep an appointment. What may interact with this medication? Do not take this medicine with any of the following medications: deferoxamine dimercaprol other iron products This medicine may also interact with the following medications: chloramphenicol deferasirox This list may not describe all possible interactions. Give your health care provider a list of all the medicines, herbs, non-prescription drugs, or dietary supplements you use. Also tell them if you smoke, drink alcohol, or use illegaldrugs. Some items may interact with your medicine. What should I watch for while using this medication? Visit your doctor or healthcare professional regularly. Tell your doctor or healthcare professional if your symptoms do not start to get better or if theyget worse. You may need blood work done while you are taking this medicine. You may need to follow a special diet. Talk to your doctor. Foods that contain iron include: whole grains/cereals, dried fruits, beans, or peas, leafy greenvegetables, and organ meats (liver, kidney). What side effects may I notice  from receiving this medication? Side effects that you should report to your doctor or health care professionalas soon as possible: allergic reactions like skin rash, itching or hives, swelling of the face, lips, or tongue breathing problems changes in blood pressure cough fast, irregular heartbeat feeling faint or lightheaded, falls fever or chills flushing, sweating, or hot feelings joint or muscle aches/pains seizures swelling of the ankles or feet unusually weak or tired Side effects that usually do not require medical attention (report to yourdoctor or health care professional if they continue or are bothersome): diarrhea feeling achy headache irritation at site where injected nausea, vomiting stomach upset tiredness This list may not describe all possible side effects. Call your doctor for medical advice about side effects. You may report side effects to FDA at1-800-FDA-1088. Where should I keep my medication? This drug is given in a hospital or clinic and will not be stored at home. NOTE: This sheet is a summary. It may not cover all possible information. If you have questions about this medicine, talk to your doctor, pharmacist, orhealth care provider.  2022 Elsevier/Gold Standard (2011-08-04 17:14:35)   Vitamin B12 Injection What is this medication? Vitamin B12 (VAHY tuh min B12) prevents and treats low vitamin B12 levels in your body. It is used in people who do not get enough vitamin B12 from their diet or when their digestive  tract does not absorb enough. Vitamin B12 plays an important role in maintaining the health of your nervous system and red bloodcells. This medicine may be used for other purposes; ask your health care provider orpharmacist if you have questions. COMMON BRAND NAME(S): B-12 Compliance Kit, B-12 Injection Kit, Cyomin, LA-12,Nutri-Twelve, Physicians EZ Use B-12, Primabalt What should I tell my care team before I take this medication? They need to know  if you have any of these conditions: Kidney disease Leber's disease Megaloblastic anemia An unusual or allergic reaction to cyanocobalamin, cobalt, other medications, foods, dyes, or preservatives Pregnant or trying to get pregnant Breast-feeding How should I use this medication? This medication is injected into a muscle or deeply under the skin. It is usually given in a clinic or care team's office. However, your care team [place] Sheets you how to inject yourself. Follow all instructions. Talk to your care team about the use of this medication in children. Specialcare may be needed. Overdosage: If you think you have taken too much of this medicine contact apoison control center or emergency room at once. NOTE: This medicine is only for you. Do not share this medicine with others. What if I miss a dose? If you are given your dose at a clinic or care team's office, call to reschedule your appointment. If you give your own injections, and you miss a dose, take it as soon as you can. If it is almost time for your next dose, takeonly that dose. Do not take double or extra doses. What may interact with this medication? Colchicine Heavy alcohol intake This list may not describe all possible interactions. Give your health care provider a list of all the medicines, herbs, non-prescription drugs, or dietary supplements you use. Also tell them if you smoke, drink alcohol, or use illegaldrugs. Some items may interact with your medicine. What should I watch for while using this medication? Visit your care team regularly. You may need blood work done while you aretaking this medication. You may need to follow a special diet. Talk to your care team. Limit youralcohol intake and avoid smoking to get the best benefit. What side effects may I notice from receiving this medication? Side effects that you should report to your care team as soon as possible: Allergic reactions-skin rash, itching, hives, swelling of the  face, lips, tongue, or throat Swelling of the ankles, hands, or feet Trouble breathing Side effects that usually do not require medical attention (report to your careteam if they continue or are bothersome): Diarrhea This list may not describe all possible side effects. Call your doctor for medical advice about side effects. You may report side effects to FDA at1-800-FDA-1088. Where should I keep my medication? Keep out of the reach of children. Store at room temperature between 15 and 30 degrees C (59 and 85 degrees F).Protect from light. Throw away any unused medication after the expiration date. NOTE: This sheet is a summary. It may not cover all possible information. If you have questions about this medicine, talk to your doctor, pharmacist, orhealth care provider.  2022 Elsevier/Gold Standard (2020-12-14 11:47:06)

## 2021-06-15 ENCOUNTER — Ambulatory Visit (INDEPENDENT_AMBULATORY_CARE_PROVIDER_SITE_OTHER): Payer: Medicare HMO | Admitting: Nurse Practitioner

## 2021-06-15 ENCOUNTER — Other Ambulatory Visit: Payer: Self-pay

## 2021-06-15 ENCOUNTER — Encounter: Payer: Self-pay | Admitting: Nurse Practitioner

## 2021-06-15 VITALS — BP 113/65 | HR 88 | Temp 99.1°F | Wt 201.2 lb

## 2021-06-15 DIAGNOSIS — D509 Iron deficiency anemia, unspecified: Secondary | ICD-10-CM | POA: Diagnosis not present

## 2021-06-15 DIAGNOSIS — I340 Nonrheumatic mitral (valve) insufficiency: Secondary | ICD-10-CM | POA: Diagnosis not present

## 2021-06-15 DIAGNOSIS — E559 Vitamin D deficiency, unspecified: Secondary | ICD-10-CM

## 2021-06-15 DIAGNOSIS — E538 Deficiency of other specified B group vitamins: Secondary | ICD-10-CM | POA: Diagnosis not present

## 2021-06-15 DIAGNOSIS — R110 Nausea: Secondary | ICD-10-CM | POA: Diagnosis not present

## 2021-06-15 DIAGNOSIS — M797 Fibromyalgia: Secondary | ICD-10-CM

## 2021-06-15 MED ORDER — VITAMIN D3 1.25 MG (50000 UT) PO CAPS: ORAL_CAPSULE | ORAL | 4 refills | Status: DC

## 2021-06-15 NOTE — Assessment & Plan Note (Signed)
Recommend she take Zofran as prescribed by GI as needed.  Continue this collaboration, recent notes reviewed.

## 2021-06-15 NOTE — Assessment & Plan Note (Signed)
Ongoing, followed by hematology and GI at this time, recent notes reviewed.  Continue this collaboration and review notes and labs when available.  Follow-up with her after colonoscopy in October.

## 2021-06-15 NOTE — Assessment & Plan Note (Signed)
Ongoing with supplement weekly on board. Check level next visit and consider transition to daily lower dosing.

## 2021-06-15 NOTE — Assessment & Plan Note (Signed)
Chronic, ongoing.  Currently on Amitriptyline by GI for IBS and Fibro.  Continue this medication regimen and adjust as needed.  Discussed with her possible trial of Duloxetine in future for symptoms relief, which she is interested in -- may benefit fibromyalgia symptoms.  Return in October for follow-up.

## 2021-06-15 NOTE — Assessment & Plan Note (Signed)
Ongoing, followed by hematology and receiving current IM injections monthly. 

## 2021-06-15 NOTE — Patient Instructions (Signed)
[employer] medicine (25th ed., pp. 1059-1068). [place], [place]: Elsevier.">  Anemia  Anemia is a condition in which there is not enough red blood cells or hemoglobin in the blood. Hemoglobin is a substance in red blood cells thatcarries oxygen. When you do not have enough red blood cells or hemoglobin (are anemic), your body cannot get enough oxygen and your organs may not work properly. Asa result, you may feel very tired or have other problems. What are the causes? Common causes of anemia include: Excessive bleeding. Anemia can be caused by excessive bleeding inside or outside the body, including bleeding from the intestines or from heavy menstrual periods in females. Poor nutrition. Long-lasting (chronic) kidney, thyroid, and liver disease. Bone marrow disorders, spleen problems, and blood disorders. Cancer and treatments for cancer. HIV (human immunodeficiency virus) and AIDS (acquired immunodeficiency syndrome). Infections, medicines, and autoimmune disorders that destroy red blood cells. What are the signs or symptoms? Symptoms of this condition include: Minor weakness. Dizziness. Headache, or difficulties concentrating and sleeping. Heartbeats that feel irregular or faster than normal (palpitations). Shortness of breath, especially with exercise. Pale skin, lips, and nails, or cold hands and feet. Indigestion and nausea. Symptoms may occur suddenly or develop slowly. If your anemia is mild, you maynot have symptoms. How is this diagnosed? This condition is diagnosed based on blood tests, your medical history, and a physical exam. In some cases, a test may be needed in which cells are removed from the soft tissue inside of a bone and looked at under a microscope (bone marrow biopsy). Your health care provider may also check your stool (feces) for blood and may do additional testing to look for the cause of yourbleeding. Other tests may include: Imaging tests, such as a CT scan or  MRI. A procedure to see inside your esophagus and stomach (endoscopy). A procedure to see inside your colon and rectum (colonoscopy). How is this treated? Treatment for this condition depends on the cause. If you continue to lose a lot of blood, you may need to be treated at a hospital. Treatment may include: Taking supplements of iron, vitamin B12, or folic acid. Taking a hormone medicine (erythropoietin) that can help to stimulate red blood cell growth. Having a blood transfusion. This may be needed if you lose a lot of blood. Making changes to your diet. Having surgery to remove your spleen. Follow these instructions at home: Take over-the-counter and prescription medicines only as told by your health care provider. Take supplements only as told by your health care provider. Follow any diet instructions that you were given by your health care provider. Keep all follow-up visits as told by your health care provider. This is important. Contact a health care provider if: You develop new bleeding anywhere in the body. Get help right away if: You are very weak. You are short of breath. You have pain in your abdomen or chest. You are dizzy or feel faint. You have trouble concentrating. You have bloody stools, black stools, or tarry stools. You vomit repeatedly or you vomit up blood. These symptoms may represent a serious problem that is an emergency. Do not wait to see if the symptoms will go away. Get medical help right away. Call your local emergency services (911 in the [place] Do not drive yourself to the hospital. Summary Anemia is a condition in which you do not have enough red blood cells or enough of a substance in your red blood cells that carries oxygen (hemoglobin). Symptoms may occur suddenly   or develop slowly. If your anemia is mild, you may not have symptoms. This condition is diagnosed with blood tests, a medical history, and a physical exam. Other tests may be  needed. Treatment for this condition depends on the cause of the anemia. This information is not intended to replace advice given to you by your health care provider. Make sure you discuss any questions you have with your healthcare provider. Document Revised: 10/01/2019 Document Reviewed: 10/01/2019 Elsevier Patient Education  2022 [employer].

## 2021-06-15 NOTE — Progress Notes (Signed)
BP 113/65   Pulse 88   Temp 99.1 F (37.3 C) (Oral)   Wt 201 lb 3.2 oz (91.3 kg)   SpO2 97%   BMI 34.35 kg/m    Subjective:    Patient ID: Mary Irwin, female    DOB: [birth year], 70 y.o.   MRN: [id number]  HPI: Mary Irwin is a 70 y.o. female  Chief Complaint  Patient presents with   Anemia    Patient is here for a follow up on Anemia. Patient denies having any concerns at today's visit.    Nausea   Emesis    Patient states it is not often, but she is still having some nausea and states the last time she vomited was Friday night.    ANEMIA Is currently being followed by hematology -- receiving infusions + B12 injections, last on 06/11/21.  Last visit with hematology -- 05/20/21.  Recent H/H 9/31.2 and B12 170 July 2022.  Going for colonoscopy in October.  Is taking Vitamin D supplement weekly due to recent level 15.7. Anemia status: stable Etiology of anemia:unknown Severity of anemia: moderate Fatigue: none Decreased exercise tolerance: none Dyspnea on exertion: occasional Palpitations: none Bleeding: no Pica: no    GERD Taking Protonix 40 MG BID + Amitriptyline.  Last saw GI 05/19/21 - [facility], Dr. [name].  Followed by them for IBS and fibromyalgia + GERD.  Reports ongoing nausea spells with intermittent vomiting.  Going for colonoscopy in October 2022 to further assess.  Has Zofran prescribed by GI.  Positive FOBT x 3 on recent tests.   She has chronic pain with with Fibromyalgia -- she does not recall what she has taken.   GERD control status: controlled Satisfied with current treatment? yes Heartburn frequency: none Medication side effects: no  Medication compliance: stable Previous GERD medications: none Antacid use frequency:  occasional Duration: none Nature: none Dysphagia: no Odynophagia:  no Hematemesis: no Blood in stool: no EGD: 07/31/2017  Relevant past medical, surgical, family and social history reviewed and updated as indicated. Interim medical  history since our last visit reviewed. Allergies and medications reviewed and updated.  Review of Systems  Constitutional:  Negative for activity change, appetite change, diaphoresis, fatigue and fever.  Respiratory:  Positive for shortness of breath. Negative for cough, chest tightness and wheezing.   Cardiovascular:  Negative for chest pain, palpitations and leg swelling.  Gastrointestinal:  Positive for nausea (occasional). Negative for abdominal distention, abdominal pain, blood in stool, constipation, diarrhea and vomiting.  Endocrine: Negative for cold intolerance, heat intolerance, polydipsia, polyphagia and polyuria.  Neurological: Negative.   Psychiatric/Behavioral: Negative.     Per HPI unless specifically indicated above     Objective:    BP 113/65   Pulse 88   Temp 99.1 F (37.3 C) (Oral)   Wt 201 lb 3.2 oz (91.3 kg)   SpO2 97%   BMI 34.35 kg/m   Wt Readings from Last 3 Encounters:  06/15/21 201 lb 3.2 oz (91.3 kg)  05/20/21 200 lb 12.8 oz (91.1 kg)  04/23/21 201 lb 9.6 oz (91.4 kg)    Physical Exam Vitals and nursing note reviewed.  Constitutional:      General: She is awake. She is not in acute distress.    Appearance: She is well-developed and well-groomed. She is obese. She is not ill-appearing or toxic-appearing.  HENT:     Head: Normocephalic and atraumatic.     Right Ear: Hearing normal.     Left Ear:  Hearing normal.     Nose: Nose normal.  Eyes:     General: Lids are normal.        Right eye: No discharge.        Left eye: No discharge.     Conjunctiva/sclera: Conjunctivae normal.     Pupils: Pupils are equal, round, and reactive to light.  Neck:     Thyroid: No thyromegaly.     Vascular: No carotid bruit.  Cardiovascular:     Rate and Rhythm: Normal rate and regular rhythm.     Heart sounds: Murmur heard.  Systolic murmur is present with a grade of 3/6.    No gallop.  Pulmonary:     Effort: Pulmonary effort is normal. No accessory muscle  usage or respiratory distress.     Breath sounds: Normal breath sounds.  Abdominal:     General: Bowel sounds are normal. There is no distension.     Palpations: Abdomen is soft. There is no hepatomegaly.     Tenderness: There is no abdominal tenderness.  Musculoskeletal:     Cervical back: Normal range of motion and neck supple.     Right lower leg: Edema (trace) present.     Left lower leg: Edema (trace) present.  Lymphadenopathy:     Cervical: No cervical adenopathy.  Skin:    General: Skin is warm and dry.  Neurological:     Mental Status: She is alert and oriented to person, place, and time.     Deep Tendon Reflexes: Reflexes are normal and symmetric.     Reflex Scores:      Brachioradialis reflexes are 2+ on the right side and 2+ on the left side.      Patellar reflexes are 2+ on the right side and 2+ on the left side. Psychiatric:        Attention and Perception: Attention normal.        Mood and Affect: Mood normal.        Speech: Speech normal.        Behavior: Behavior normal. Behavior is cooperative.        Thought Content: Thought content normal.    Results for orders placed or performed in visit on 05/20/21  Ferritin  Result Value Ref Range   Ferritin 8 (L) 11 - 307 ng/mL  CBC with Differential  Result Value Ref Range   WBC 9.4 4.0 - 10.5 K/uL   RBC 4.42 3.87 - 5.11 MIL/uL   Hemoglobin 9.0 (L) 12.0 - 15.0 g/dL   HCT 31.2 (L) 36.0 - 46.0 %   MCV 70.6 (L) 80.0 - 100.0 fL   MCH 20.4 (L) 26.0 - 34.0 pg   MCHC 28.8 (L) 30.0 - 36.0 g/dL   RDW 24.7 (H) 11.5 - 15.5 %   Platelets 392 150 - 400 K/uL   nRBC 0.0 0.0 - 0.2 %   Neutrophils Relative % 69 %   Neutro Abs 6.5 1.7 - 7.7 K/uL   Lymphocytes Relative 16 %   Lymphs Abs 1.5 0.7 - 4.0 K/uL   Monocytes Relative 8 %   Monocytes Absolute 0.8 0.1 - 1.0 K/uL   Eosinophils Relative 6 %   Eosinophils Absolute 0.5 0.0 - 0.5 K/uL   Basophils Relative 1 %   Basophils Absolute 0.1 0.0 - 0.1 K/uL   Immature Granulocytes  0 %   Abs Immature Granulocytes 0.03 0.00 - 0.07 K/uL      Assessment & Plan:   Problem List Items  Addressed This Visit       Cardiovascular and Mediastinum   Mitral regurgitation    Noted on echo and murmur noted on exam, continue to collaborate with cardiology.         Other   Iron deficiency anemia - Primary    Ongoing, followed by hematology and GI at this time, recent notes reviewed.  Continue this collaboration and review notes and labs when available.  Follow-up with her after colonoscopy in October.       Fibromyalgia    Chronic, ongoing.  Currently on Amitriptyline by GI for IBS and Fibro.  Continue this medication regimen and adjust as needed.  Discussed with her possible trial of Duloxetine in future for symptoms relief, which she is interested in -- may benefit fibromyalgia symptoms.  Return in October for follow-up.       Vitamin D deficiency    Ongoing with supplement weekly on board. Check level next visit and consider transition to daily lower dosing.       B12 deficiency    Ongoing, followed by hematology and receiving current IM injections monthly.       Nausea    Recommend she take Zofran as prescribed by GI as needed.  Continue this collaboration, recent notes reviewed.         Follow up plan: Return in about 11 weeks (around 08/31/2021) for Anemia.

## 2021-06-15 NOTE — Assessment & Plan Note (Signed)
Noted on echo and murmur noted on exam, continue to collaborate with cardiology.

## 2021-06-18 ENCOUNTER — Inpatient Hospital Stay: Payer: Medicare HMO

## 2021-06-18 VITALS — BP 129/67 | HR 94 | Temp 97.6°F | Resp 20

## 2021-06-18 DIAGNOSIS — D509 Iron deficiency anemia, unspecified: Secondary | ICD-10-CM

## 2021-06-18 DIAGNOSIS — Z79899 Other long term (current) drug therapy: Secondary | ICD-10-CM | POA: Diagnosis not present

## 2021-06-18 DIAGNOSIS — D500 Iron deficiency anemia secondary to blood loss (chronic): Secondary | ICD-10-CM | POA: Diagnosis not present

## 2021-06-18 DIAGNOSIS — E538 Deficiency of other specified B group vitamins: Secondary | ICD-10-CM | POA: Diagnosis not present

## 2021-06-18 MED ORDER — SODIUM CHLORIDE 0.9 % IV SOLN
Freq: Once | INTRAVENOUS | Status: AC
  Filled 2021-06-18: qty 250

## 2021-06-18 MED ORDER — SODIUM CHLORIDE 0.9 % IV SOLN: 200.0000 mg | Freq: Once | INTRAVENOUS | Status: DC

## 2021-06-18 MED ORDER — IRON SUCROSE 20 MG/ML IV SOLN
200.0000 mg | Freq: Once | INTRAVENOUS | Status: AC
  Administered 2021-06-18: 200 mg via INTRAVENOUS
  Filled 2021-06-18: qty 10

## 2021-07-08 ENCOUNTER — Encounter: Payer: Self-pay | Admitting: *Deleted

## 2021-07-15 ENCOUNTER — Other Ambulatory Visit: Payer: Self-pay

## 2021-07-15 ENCOUNTER — Inpatient Hospital Stay (HOSPITAL_BASED_OUTPATIENT_CLINIC_OR_DEPARTMENT_OTHER): Payer: Medicare HMO | Admitting: Hospice and Palliative Medicine

## 2021-07-15 ENCOUNTER — Inpatient Hospital Stay: Payer: Medicare HMO

## 2021-07-15 ENCOUNTER — Inpatient Hospital Stay: Payer: Medicare HMO | Attending: Nurse Practitioner

## 2021-07-15 VITALS — BP 139/65 | HR 84 | Temp 98.8°F | Resp 18 | Wt 204.0 lb

## 2021-07-15 DIAGNOSIS — K589 Irritable bowel syndrome without diarrhea: Secondary | ICD-10-CM | POA: Diagnosis not present

## 2021-07-15 DIAGNOSIS — Z803 Family history of malignant neoplasm of breast: Secondary | ICD-10-CM | POA: Insufficient documentation

## 2021-07-15 DIAGNOSIS — J45909 Unspecified asthma, uncomplicated: Secondary | ICD-10-CM | POA: Insufficient documentation

## 2021-07-15 DIAGNOSIS — I10 Essential (primary) hypertension: Secondary | ICD-10-CM | POA: Insufficient documentation

## 2021-07-15 DIAGNOSIS — D519 Vitamin B12 deficiency anemia, unspecified: Secondary | ICD-10-CM | POA: Diagnosis not present

## 2021-07-15 DIAGNOSIS — I4891 Unspecified atrial fibrillation: Secondary | ICD-10-CM | POA: Insufficient documentation

## 2021-07-15 DIAGNOSIS — K644 Residual hemorrhoidal skin tags: Secondary | ICD-10-CM | POA: Diagnosis not present

## 2021-07-15 DIAGNOSIS — R110 Nausea: Secondary | ICD-10-CM | POA: Diagnosis not present

## 2021-07-15 DIAGNOSIS — K219 Gastro-esophageal reflux disease without esophagitis: Secondary | ICD-10-CM | POA: Insufficient documentation

## 2021-07-15 DIAGNOSIS — D509 Iron deficiency anemia, unspecified: Secondary | ICD-10-CM

## 2021-07-15 DIAGNOSIS — Z79899 Other long term (current) drug therapy: Secondary | ICD-10-CM | POA: Diagnosis not present

## 2021-07-15 DIAGNOSIS — E039 Hypothyroidism, unspecified: Secondary | ICD-10-CM | POA: Insufficient documentation

## 2021-07-15 DIAGNOSIS — M797 Fibromyalgia: Secondary | ICD-10-CM | POA: Insufficient documentation

## 2021-07-15 DIAGNOSIS — E538 Deficiency of other specified B group vitamins: Secondary | ICD-10-CM | POA: Diagnosis not present

## 2021-07-15 LAB — CBC WITH DIFFERENTIAL/PLATELET
Abs Immature Granulocytes: 0.03 10*3/uL (ref 0.00–0.07)
Basophils Absolute: 0.1 10*3/uL (ref 0.0–0.1)
Basophils Relative: 1 %
Eosinophils Absolute: 0.4 10*3/uL (ref 0.0–0.5)
Eosinophils Relative: 5 %
HCT: 43 % (ref 36.0–46.0)
Hemoglobin: 13.2 g/dL (ref 12.0–15.0)
Immature Granulocytes: 0 %
Lymphocytes Relative: 15 %
Lymphs Abs: 1.3 10*3/uL (ref 0.7–4.0)
MCH: 25.6 pg — ABNORMAL LOW (ref 26.0–34.0)
MCHC: 30.7 g/dL (ref 30.0–36.0)
MCV: 83.3 fL (ref 80.0–100.0)
Monocytes Absolute: 0.5 10*3/uL (ref 0.1–1.0)
Monocytes Relative: 6 %
Neutro Abs: 6.5 10*3/uL (ref 1.7–7.7)
Neutrophils Relative %: 73 %
Platelets: 344 10*3/uL (ref 150–400)
RBC: 5.16 MIL/uL — ABNORMAL HIGH (ref 3.87–5.11)
RDW: 22.5 % — ABNORMAL HIGH (ref 11.5–15.5)
WBC: 8.8 10*3/uL (ref 4.0–10.5)
nRBC: 0 % (ref 0.0–0.2)

## 2021-07-15 LAB — IRON AND TIBC
Iron: 64 ug/dL (ref 28–170)
Saturation Ratios: 12 % (ref 10.4–31.8)
TIBC: 522 ug/dL — ABNORMAL HIGH (ref 250–450)
UIBC: 458 ug/dL

## 2021-07-15 LAB — FERRITIN: Ferritin: 89 ng/mL (ref 11–307)

## 2021-07-15 LAB — VITAMIN B12: Vitamin B-12: 170 pg/mL — ABNORMAL LOW (ref 180–914)

## 2021-07-15 MED ORDER — CYANOCOBALAMIN 1000 MCG/ML IJ SOLN
1000.0000 ug | Freq: Once | INTRAMUSCULAR | Status: AC
  Administered 2021-07-15: 1000 ug via INTRAMUSCULAR
  Filled 2021-07-15: qty 1

## 2021-07-15 NOTE — Progress Notes (Signed)
Hematology Progress Note [facility] [address] [place], [place] [address] [phone] (phone) [phone] (fax)  Clinic Day:  07/15/2021  Referring physician: Venita Lick, NP  CHIEF COMPLAINT:  CC: Anemia  HISTORY OF PRESENTING ILLNESS:  Mary Irwin is a 70 y.o. female with a history of a fib, on eliquis, Irritable Bowel Syndrome, GERD, anemia, hypertension, who is referred in consultation with Marnee Guarneri, NP for assessment and management of anemia.  Patient presented to ER on 04/16/2021 after being found to have hemoglobin of 7.  She received 2 units PRBCs.  She has not required blood transfusion since 2018 prior to 2022.   - CSY: 05/2017 - diverticulosis in sigmoid and distal descending colon, redundant colon, non-thrombosed external hemorrhoids - EGD: 07/2017 - Z-line regular with GEJ biopsies showing reflux gastroesophagitis, normal stomach and duodenum.  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  Previously, labs showed low B12, low iron levels, positive Hemoccult stools.  In the interim she has been seen by GI and has colonoscopy planned for October.   Patient received 5 doses of IV Venofer (last on 06/18/2021).  She has also been receiving monthly cyanocobalamin injections.  Since patient was last seen by Beckey Rutter, NP on 05/20/2021, she reports that she has been doing reasonably well.  She denies significant changes or concerns.  She continues to endorse occasional nausea and chronic fatigue but this sounds unchanged in characteristic or frequency.  No other symptomatic complaints  Review of Systems  Constitutional:  Positive for malaise/fatigue. Negative for chills, fever and weight  loss.  HENT:  Negative for hearing loss, nosebleeds, sore throat and tinnitus.   Eyes:  Negative for blurred vision and double vision.  Respiratory:  Negative for cough, hemoptysis, shortness of breath and wheezing.   Cardiovascular:  Negative for chest pain, palpitations and leg swelling.  Gastrointestinal:  Positive for nausea. Negative for abdominal pain, blood in stool, constipation, diarrhea, melena and vomiting.  Genitourinary:  Negative for dysuria, flank pain and urgency.  Musculoskeletal:  Negative for back pain, falls, joint pain and myalgias.  Skin:  Negative for itching and rash.  Neurological:  Negative for dizziness, tingling, sensory change, loss of consciousness, weakness and headaches.  Endo/Heme/Allergies:  Negative for environmental allergies. Does not bruise/bleed easily.    Past Medical History:  Diagnosis Date   Anemia    on iron supplements   Arthritis    all over   Asthma    once a week   Atrial fibrillation (HCC)    Dysrhythmia    Atrial Fibrillation   Fibromyalgia    GERD (gastroesophageal reflux disease)    Hypertension    Hypothyroidism    no meds   IBS (irritable bowel syndrome)    PONV (postoperative nausea and vomiting)    Sinus trouble      Past Surgical History:  Procedure Laterality Date   BACK SURGERY     CATARACT EXTRACTION W/PHACO Right 04/22/2019   Procedure: CATARACT EXTRACTION PHACO AND INTRAOCULAR LENS PLACEMENT ([place]) RIGHT;  Surgeon: Eulogio Bear, MD;  Location: [place];  Service: Ophthalmology;  Laterality: Right;   CATARACT EXTRACTION W/PHACO Left 05/27/2019   Procedure: CATARACT EXTRACTION PHACO AND INTRAOCULAR LENS PLACEMENT ([facility])  LEFT;  Surgeon:  Eulogio Bear, MD;  Location: [place];  Service: Ophthalmology;  Laterality: Left;   CHOLECYSTECTOMY N/A 09/22/2017   Procedure: LAPAROSCOPIC CHOLECYSTECTOMY WITH INTRAOPERATIVE CHOLANGIOGRAM;  Surgeon: Olean Ree, MD;  Location: [facility];  Service:  General;  Laterality: N/A;   COLONOSCOPY WITH PROPOFOL N/A 05/23/2017   Procedure: COLONOSCOPY WITH PROPOFOL;  Surgeon: Lollie Sails, MD;  Location: [facility] ENDOSCOPY;  Service: Endoscopy;  Laterality: N/A;   ESOPHAGOGASTRODUODENOSCOPY (EGD) WITH PROPOFOL N/A 05/23/2017   Procedure: ESOPHAGOGASTRODUODENOSCOPY (EGD) WITH PROPOFOL;  Surgeon: Lollie Sails, MD;  Location: [facility] ENDOSCOPY;  Service: Endoscopy;  Laterality: N/A;   ESOPHAGOGASTRODUODENOSCOPY (EGD) WITH PROPOFOL N/A 07/31/2017   Procedure: ESOPHAGOGASTRODUODENOSCOPY (EGD) WITH PROPOFOL;  Surgeon: Lollie Sails, MD;  Location: [facility] ENDOSCOPY;  Service: Endoscopy;  Laterality: N/A;   HERNIA REPAIR     umbilical   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   JOINT REPLACEMENT Bilateral    total hip replacement    Family History  Problem Relation Age of Onset   Breast cancer Maternal Aunt 92   Hypertension Mother     Social History   Socioeconomic History   Marital status: Divorced    Spouse name: Not on file   Number of children: Not on file   Years of education: Not on file   Highest education level: Not on file  Occupational History   Not on file  Tobacco Use   Smoking status: Never   Smokeless tobacco: Never  Vaping Use   Vaping Use: Never used  Substance and Sexual Activity   Alcohol use: No   Drug use: No   Sexual activity: Not on file  Other Topics Concern   Not on file  Social History Narrative   Not on file   Social Determinants of Health   Financial Resource Strain: Not on file  Food Insecurity: Not on file  Transportation Needs: Not on file  Physical Activity: Not on file  Stress: Not on file  Social Connections: Not on file    Allergies  Allergen Reactions   Lisinopril Swelling    After further review this may have been caused by Reglan   Metoclopramide Swelling    Current Outpatient Medications on File Prior to Visit  Medication Sig Dispense Refill   albuterol  (PROVENTIL HFA;VENTOLIN HFA) 108 (90 Base) MCG/ACT inhaler Inhale 2 puffs into the lungs every 6 (six) hours as needed for wheezing or shortness of breath.     amitriptyline (ELAVIL) 10 MG tablet Take 10 mg by mouth at bedtime.     apixaban (ELIQUIS) 5 MG TABS tablet Take 5 mg by mouth 2 (two) times daily. Am and bedtime     Cholecalciferol (VITAMIN D3) 1.25 MG (50000 UT) CAPS TAKE ONE CAPSULE BY MOUTH ONCE A WEEK. 12 capsule 4   diltiazem (TIAZAC) 300 MG 24 hr capsule Take 300 mg by mouth daily.     fluticasone furoate-vilanterol (BREO ELLIPTA) 200-25 MCG/INH AEPB Inhale 1 puff into the lungs daily. am     hydrochlorothiazide (HYDRODIURIL) 25 MG tablet Take 25 mg by mouth daily. am     pantoprazole (PROTONIX) 40 MG tablet Take 40 mg by mouth 2 (two) times daily. Am and bedtime     rosuvastatin (CRESTOR) 10 MG tablet Take 1 tablet (10 mg total) by mouth daily. 90 tablet 4   No current facility-administered medications on file prior to visit.    VITALS:  Blood pressure 139/65, pulse 84, temperature 98.8 F (37.1  C), temperature source Oral, resp. rate 18, weight 204 lb (92.5 kg).  Wt Readings from Last 3 Encounters:  07/15/21 204 lb (92.5 kg)  06/15/21 201 lb 3.2 oz (91.3 kg)  05/20/21 200 lb 12.8 oz (91.1 kg)    Body mass index is 34.83 kg/m.  Performance status (ECOG): 1 - Symptomatic but completely ambulatory  PHYSICAL EXAM:  Physical Exam Vitals and nursing note reviewed.  Constitutional:      Appearance: Normal appearance. She is not ill-appearing.  HENT:     Head: Normocephalic.  Eyes:     General: No scleral icterus.    Conjunctiva/sclera: Conjunctivae normal.  Cardiovascular:     Rate and Rhythm: Normal rate and regular rhythm.     Pulses: Normal pulses.  Pulmonary:     Effort: Pulmonary effort is normal. No respiratory distress.     Breath sounds: Normal breath sounds. No wheezing.  Abdominal:     General: There is no distension.     Tenderness: There is no  abdominal tenderness. There is no guarding.  Musculoskeletal:        General: No swelling or deformity.     Cervical back: Neck supple.  Lymphadenopathy:     Cervical: No cervical adenopathy.  Skin:    General: Skin is warm and dry.     Coloration: Skin is not pale.     Findings: No rash.  Neurological:     General: No focal deficit present.     Mental Status: She is alert and oriented to person, place, and time.  Psychiatric:        Behavior: Behavior normal.        Thought Content: Thought content normal.        Judgment: Judgment normal.    LABS:   CBC Latest Ref Rng & Units 07/15/2021 05/20/2021 04/23/2021  WBC 4.0 - 10.5 K/uL 8.8 9.4 8.4  Hemoglobin 12.0 - 15.0 g/dL 13.2 9.0(L) 10.0(L)  Hematocrit 36.0 - 46.0 % 43.0 31.2(L) 34.7(L)  Platelets 150 - 400 K/uL 344 392 454(H)   CMP Latest Ref Rng & Units 04/23/2021 04/16/2021 04/16/2021  Glucose 70 - 99 mg/dL 103(H) 104(H) 99  BUN 8 - 23 mg/dL '12 11 12  '$ Creatinine 0.44 - 1.00 mg/dL 0.73 0.73 0.80  Sodium 135 - 145 mmol/L 138 143 141  Potassium 3.5 - 5.1 mmol/L 4.0 3.8 3.7  Chloride 98 - 111 mmol/L 103 105 110  CO2 22 - 32 mmol/L 24 16(L) 23  Calcium 8.9 - 10.3 mg/dL 9.4 9.5 9.1  Total Protein 6.5 - 8.1 g/dL 7.4 6.9 7.1  Total Bilirubin 0.3 - 1.2 mg/dL 0.4 <0.2 0.6  Alkaline Phos 38 - 126 U/L 95 115 78  AST 15 - 41 U/L 38 22 25  ALT 0 - 44 U/L '25 16 17   '$ No results found for: CEA1 / No results found for: CEA1 No results found for: PSA1 No results found for: CAN199 No results found for: [id number]  Lab Results  Component Value Date   TOTALPROTELP 6.7 04/23/2021   ALBUMINELP 3.7 04/23/2021   A1GS 0.3 04/23/2021   A2GS 0.9 04/23/2021   BETS 1.2 04/23/2021   GAMS 0.6 04/23/2021   MSPIKE Not Observed 04/23/2021   SPEI Comment 04/23/2021   Lab Results  Component Value Date   TIBC 626 (H) 04/23/2021   TIBC 541 (H) 04/16/2021   TIBC 594 (H) 06/01/2017   FERRITIN 8 (L) 05/20/2021   FERRITIN 12 04/23/2021   FERRITIN 7 (  L)  04/16/2021   IRONPCTSAT 4 (L) 04/23/2021   IRONPCTSAT 2 (LL) 04/16/2021   IRONPCTSAT 2 (L) 06/01/2017   Lab Results  Component Value Date   LDH 152 04/23/2021    STUDIES:  No results found.     ASSESSMENT & PLAN:   Assessment/plan:  CHEYENNE INGEMI is a 70 y.o. female who returns to clinic for evaluation of anemia.   Iron deficiency anemia- Hemoglobin has normalized (13.2 today) after 1000 mg of Venofer.  Iron studies are pending but no acute indication for more IV iron at the present time.  Patient is pending colonoscopy in October.  Discussed with Dr. Rogue Bussing and will plan 37-monthfollow-up for MD/labs/possible Venofer. B12 deficiency-on monthly cyanocobalamin.  We will proceed with IM B12 today.  Case and plan discussed with Dr. BRogue Bussing I discussed the assessment and treatment plan with the patient.  The patient was provided an opportunity to ask questions and all were answered.  The patient agreed with the plan and demonstrated an understanding of the instructions.  The patient was advised to call back if the symptoms worsen or if the condition fails to improve as anticipated.  Thank you for the opportunity to participate in the care of this pleasant patient.   JIrean Hong NP

## 2021-07-15 NOTE — Progress Notes (Signed)
Follow-up iron deficiency anemia. States that she's felt a little nauseated and had some fatigue.

## 2021-07-27 DIAGNOSIS — H26491 Other secondary cataract, right eye: Secondary | ICD-10-CM | POA: Diagnosis not present

## 2021-07-28 ENCOUNTER — Telehealth: Payer: Self-pay | Admitting: Nurse Practitioner

## 2021-07-28 NOTE — Telephone Encounter (Signed)
Copied from [place] [id number]. Topic: Medicare AWV >> Jul 28, 2021 11:19 AM Lavonia Drafts wrote: Reason for CRM:  No answer, unable to leave a message for patient to call back and schedule Medicare Annual Wellness Visit (AWV) to be done virtually or by telephone.  No hx of AWV eligible as of 02/05/17  Please schedule at anytime with [facility]-Nurse Health Advisor.      44 Minutes appointment   Any questions, please call me at [phone]

## 2021-08-10 ENCOUNTER — Telehealth: Payer: Self-pay

## 2021-08-10 NOTE — Telephone Encounter (Signed)
Spoke with Mary Irwin and was informed that she received the surgical clearance fax today.

## 2021-08-10 NOTE — Telephone Encounter (Signed)
Copied from [place] [id number]. Topic: General - Other >> Aug 09, 2021  3:08 PM Tessa Lerner A wrote: Reason for CRM: Noreene Larsson with [facility] has called regarding a previously submitted cardiac clearance form for the patient   Mary Irwin would like to know when the patient needs to begin cessation of their apixaban (ELIQUIS) 5 MG TABS tablet [[id number]]  prescription  Please contact further when possible or respond to [facility] via fax at [phone]  Noreene Larsson shares that clearance forms were submitted on 08/01/21 08/03/21 and 08/09/21  The procedure is scheduled for 08/18/21

## 2021-08-12 ENCOUNTER — Inpatient Hospital Stay: Payer: Medicare HMO | Attending: Internal Medicine

## 2021-08-12 DIAGNOSIS — D509 Iron deficiency anemia, unspecified: Secondary | ICD-10-CM

## 2021-08-12 DIAGNOSIS — E538 Deficiency of other specified B group vitamins: Secondary | ICD-10-CM | POA: Insufficient documentation

## 2021-08-12 DIAGNOSIS — Z79899 Other long term (current) drug therapy: Secondary | ICD-10-CM | POA: Insufficient documentation

## 2021-08-12 MED ORDER — CYANOCOBALAMIN 1000 MCG/ML IJ SOLN
1000.0000 ug | Freq: Once | INTRAMUSCULAR | Status: AC
  Administered 2021-08-12: 1000 ug via INTRAMUSCULAR
  Filled 2021-08-12: qty 1

## 2021-08-17 ENCOUNTER — Encounter: Payer: Self-pay | Admitting: Internal Medicine

## 2021-08-18 ENCOUNTER — Encounter
Admission: RE | Disposition: A | Discharge status: Home / Self Care | Payer: Self-pay | Source: Home / Self Care | Attending: Internal Medicine

## 2021-08-18 ENCOUNTER — Ambulatory Visit: Payer: Medicare HMO | Admitting: Certified Registered"

## 2021-08-18 ENCOUNTER — Ambulatory Visit
Admission: RE | Admit: 2021-08-18 | Discharge: 2021-08-18 | Disposition: A | Discharge status: Home / Self Care | Payer: Medicare HMO | Attending: Internal Medicine | Admitting: Internal Medicine

## 2021-08-18 ENCOUNTER — Encounter: Payer: Self-pay | Admitting: Internal Medicine

## 2021-08-18 DIAGNOSIS — K2970 Gastritis, unspecified, without bleeding: Secondary | ICD-10-CM | POA: Diagnosis not present

## 2021-08-18 DIAGNOSIS — D123 Benign neoplasm of transverse colon: Secondary | ICD-10-CM | POA: Diagnosis not present

## 2021-08-18 DIAGNOSIS — K31819 Angiodysplasia of stomach and duodenum without bleeding: Secondary | ICD-10-CM | POA: Diagnosis not present

## 2021-08-18 DIAGNOSIS — Z888 Allergy status to other drugs, medicaments and biological substances status: Secondary | ICD-10-CM | POA: Diagnosis not present

## 2021-08-18 DIAGNOSIS — Z79899 Other long term (current) drug therapy: Secondary | ICD-10-CM | POA: Diagnosis not present

## 2021-08-18 DIAGNOSIS — D126 Benign neoplasm of colon, unspecified: Secondary | ICD-10-CM | POA: Diagnosis not present

## 2021-08-18 DIAGNOSIS — D500 Iron deficiency anemia secondary to blood loss (chronic): Secondary | ICD-10-CM | POA: Diagnosis not present

## 2021-08-18 DIAGNOSIS — K648 Other hemorrhoids: Secondary | ICD-10-CM | POA: Diagnosis not present

## 2021-08-18 DIAGNOSIS — E785 Hyperlipidemia, unspecified: Secondary | ICD-10-CM | POA: Diagnosis not present

## 2021-08-18 DIAGNOSIS — D131 Benign neoplasm of stomach: Secondary | ICD-10-CM | POA: Diagnosis not present

## 2021-08-18 DIAGNOSIS — K635 Polyp of colon: Secondary | ICD-10-CM | POA: Diagnosis not present

## 2021-08-18 DIAGNOSIS — K641 Second degree hemorrhoids: Secondary | ICD-10-CM | POA: Diagnosis not present

## 2021-08-18 DIAGNOSIS — K219 Gastro-esophageal reflux disease without esophagitis: Secondary | ICD-10-CM | POA: Diagnosis not present

## 2021-08-18 DIAGNOSIS — K642 Third degree hemorrhoids: Secondary | ICD-10-CM | POA: Diagnosis not present

## 2021-08-18 DIAGNOSIS — K5730 Diverticulosis of large intestine without perforation or abscess without bleeding: Secondary | ICD-10-CM | POA: Insufficient documentation

## 2021-08-18 DIAGNOSIS — K644 Residual hemorrhoidal skin tags: Secondary | ICD-10-CM | POA: Diagnosis not present

## 2021-08-18 DIAGNOSIS — Z7951 Long term (current) use of inhaled steroids: Secondary | ICD-10-CM | POA: Diagnosis not present

## 2021-08-18 DIAGNOSIS — K317 Polyp of stomach and duodenum: Secondary | ICD-10-CM | POA: Diagnosis not present

## 2021-08-18 DIAGNOSIS — Z7901 Long term (current) use of anticoagulants: Secondary | ICD-10-CM | POA: Insufficient documentation

## 2021-08-18 DIAGNOSIS — K2100 Gastro-esophageal reflux disease with esophagitis, without bleeding: Secondary | ICD-10-CM | POA: Diagnosis not present

## 2021-08-18 DIAGNOSIS — K2289 Other specified disease of esophagus: Secondary | ICD-10-CM | POA: Diagnosis not present

## 2021-08-18 DIAGNOSIS — K589 Irritable bowel syndrome without diarrhea: Secondary | ICD-10-CM | POA: Insufficient documentation

## 2021-08-18 HISTORY — PX: COLONOSCOPY WITH PROPOFOL: SHX5780

## 2021-08-18 HISTORY — DX: Diverticulosis of intestine, part unspecified, without perforation or abscess without bleeding: K57.90

## 2021-08-18 HISTORY — DX: Allergic rhinitis due to pollen: J30.1

## 2021-08-18 HISTORY — PX: ESOPHAGOGASTRODUODENOSCOPY (EGD) WITH PROPOFOL: SHX5813

## 2021-08-18 HISTORY — DX: Hyperlipidemia, unspecified: E78.5

## 2021-08-18 HISTORY — DX: Psychophysiologic insomnia: F51.04

## 2021-08-18 HISTORY — DX: Ventricular premature depolarization: I49.3

## 2021-08-18 SURGERY — COLONOSCOPY WITH PROPOFOL
Anesthesia: General

## 2021-08-18 MED ORDER — GLYCOPYRROLATE 0.2 MG/ML IJ SOLN
INTRAMUSCULAR | Status: DC | PRN
  Administered 2021-08-18: .2 mg via INTRAVENOUS

## 2021-08-18 MED ORDER — LIDOCAINE HCL (CARDIAC) PF 100 MG/5ML IV SOSY
PREFILLED_SYRINGE | INTRAVENOUS | Status: DC | PRN
  Administered 2021-08-18: 100 mg via INTRAVENOUS

## 2021-08-18 MED ORDER — PROPOFOL 10 MG/ML IV BOLUS
INTRAVENOUS | Status: DC | PRN
Start: 2021-08-18 — End: 2021-08-18
  Administered 2021-08-18 (×2): 50 mg via INTRAVENOUS
  Administered 2021-08-18: 150 mg via INTRAVENOUS
  Administered 2021-08-18: 50 mg via INTRAVENOUS

## 2021-08-18 MED ORDER — DEXMEDETOMIDINE (PRECEDEX) IN NS 20 MCG/5ML (4 MCG/ML) IV SYRINGE
PREFILLED_SYRINGE | INTRAVENOUS | Status: DC | PRN
  Administered 2021-08-18: 8 ug via INTRAVENOUS

## 2021-08-18 MED ORDER — PROPOFOL 500 MG/50ML IV EMUL
INTRAVENOUS | Status: DC | PRN
  Administered 2021-08-18: 150 ug/kg/min via INTRAVENOUS

## 2021-08-18 MED ORDER — SODIUM CHLORIDE 0.9 % IV SOLN
INTRAVENOUS | Status: DC
  Administered 2021-08-18: 1000 mL via INTRAVENOUS

## 2021-08-18 MED ORDER — PROPOFOL 10 MG/ML IV BOLUS
INTRAVENOUS | Status: AC
  Filled 2021-08-18: qty 40

## 2021-08-18 NOTE — Anesthesia Postprocedure Evaluation (Signed)
Anesthesia Post Note  Patient: Mirelle Biskup Councilman  Procedure(s) Performed: COLONOSCOPY WITH PROPOFOL ESOPHAGOGASTRODUODENOSCOPY (EGD) WITH PROPOFOL  Patient location during evaluation: PACU Anesthesia Type: General Level of consciousness: awake and alert, oriented and patient cooperative Pain management: pain level controlled Vital Signs Assessment: post-procedure vital signs reviewed and stable Respiratory status: spontaneous breathing, nonlabored ventilation and respiratory function stable Cardiovascular status: blood pressure returned to baseline and stable Postop Assessment: adequate PO intake Anesthetic complications: no   No notable events documented.   Last Vitals:  Vitals:   08/18/21 0954 08/18/21 1056  BP: (!) 176/74 140/76  Pulse: 85   Resp: 16   Temp: 36.8 C 36.7 C  SpO2: 98%     Last Pain:  Vitals:   08/18/21 1116  TempSrc:   PainSc: 0-No pain                 Darrin Nipper

## 2021-08-18 NOTE — Op Note (Signed)
[facility] Gastroenterology Patient Name: Mary Irwin Procedure Date: 08/18/2021 10:05 AM MRN: [id number] Account #: [id number] Date of Birth: July 16, 1951 Admit Type: Outpatient Age: 70 Room: [facility] ENDO ROOM 2 Gender: Female Note Status: Finalized Instrument Name: Jasper Riling [id number] Procedure:             Colonoscopy Indications:           Iron deficiency anemia secondary to chronic blood loss Providers:             [name]. [name] MD, MD Referring MD:          Barbaraann Faster. Cannady (Referring MD) Medicines:             Propofol per Anesthesia Complications:         No immediate complications. Procedure:             Pre-Anesthesia Assessment:                        - The risks and benefits of the procedure and the                         sedation options and risks were discussed with the                         patient. All questions were answered and informed                         consent was obtained.                        - Patient identification and proposed procedure were                         verified prior to the procedure by the nurse. The                         procedure was verified in the procedure room.                        - ASA Grade Assessment: III - A patient with severe                         systemic disease.                        - After reviewing the risks and benefits, the patient                         was deemed in satisfactory condition to undergo the                         procedure.                        After obtaining informed consent, the colonoscope was                         passed under direct vision. Throughout the procedure,  the patient's blood pressure, pulse, and oxygen                         saturations were monitored continuously. The                         Colonoscope was introduced through the anus and                         advanced to the the cecum, identified by appendiceal                          orifice and ileocecal valve. The colonoscopy was                         performed without difficulty. The patient tolerated                         the procedure well. The quality of the bowel                         preparation was adequate. The ileocecal valve,                         appendiceal orifice, and rectum were photographed. Findings:      The perianal exam findings include internal hemorrhoids that prolapse       with straining, but require manual replacement into the anal canal       (Grade III).      A 5 mm polyp was found in the transverse colon. The polyp was sessile.       The polyp was removed with a jumbo cold forceps. Resection and retrieval       were complete.      Many small-mouthed diverticula were found in the left colon.      Non-bleeding internal hemorrhoids were found during retroflexion. The       hemorrhoids were Grade II (internal hemorrhoids that prolapse but reduce       spontaneously).      The exam was otherwise without abnormality on direct and retroflexion       views. Impression:            - Internal hemorrhoids that prolapse with straining,                         but require manual replacement into the anal canal                         (Grade III) found on perianal exam.                        - One 5 mm polyp in the transverse colon, removed with                         a jumbo cold forceps. Resected and retrieved.                        - Diverticulosis in the left colon.                        -  Non-bleeding internal hemorrhoids.                        - The examination was otherwise normal on direct and                         retroflexion views. Recommendation:        - Await pathology results from EGD, also performed                         today.                        - Patient has a contact number available for                         emergencies. The signs and symptoms of potential                         delayed  complications were discussed with the patient.                         Return to normal activities tomorrow. Written                         discharge instructions were provided to the patient.                        - Resume previous diet.                        - Continue present medications.                        - Repeat colonoscopy is recommended for surveillance.                         The colonoscopy date will be determined after                         pathology results from today's exam become available                         for review.                        - Return to physician assistant in 2 months.                        - The findings and recommendations were discussed with                         the patient.                        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 Continue to follow outpatient serial  CBC, iron levels. Procedure Code(s):     --- Professional ---                        [phone], Colonoscopy, flexible; with biopsy, single or                         multiple Diagnosis Code(s):     --- Professional ---                        K57.30, Diverticulosis of large intestine without                         perforation or abscess without bleeding                        D50.0, Iron deficiency anemia secondary to blood loss                         (chronic)                        K64.2, Third degree hemorrhoids                        K63.5, Polyp of colon CPT copyright 2019 American Medical Association. All rights reserved. The codes documented in this report are preliminary and upon coder review may  be revised to meet current compliance requirements. Efrain Sella MD, MD 08/18/2021 10:57:05 AM This report has been signed electronically. Number of Addenda: 0 Note Initiated On: 08/18/2021 10:05 AM Scope Withdrawal Time: 0 hours 8 minutes 31 seconds  Total Procedure Duration: 0 hours  13 minutes 29 seconds  Estimated Blood Loss:  Estimated blood loss: none.      [facility]

## 2021-08-18 NOTE — Anesthesia Procedure Notes (Signed)
Procedure Name: MAC Date/Time: 08/18/2021 10:30 AM Performed by: Biagio Borg, CRNA Pre-anesthesia Checklist: Patient identified, Emergency Drugs available, Suction available, Patient being monitored and Timeout performed Patient Re-evaluated:Patient Re-evaluated prior to induction Oxygen Delivery Method: Nasal cannula Induction Type: IV induction

## 2021-08-18 NOTE — Anesthesia Preprocedure Evaluation (Addendum)
Anesthesia Evaluation  Patient identified by MRN, date of birth, ID band Patient awake    Reviewed: Allergy & Precautions, NPO status , Patient's Chart, lab work & pertinent test results  History of Anesthesia Complications (+) PONV and history of anesthetic complications  Airway Mallampati: III   Neck ROM: Full    Dental  (+) Poor Dentition Many missing teeth; lower front tooth loose:   Pulmonary asthma ,    Pulmonary exam normal breath sounds clear to auscultation       Cardiovascular hypertension, + dysrhythmias (a fib on Eliquis)  Rhythm:Irregular Rate:Normal  Echo 04/21/21: NORMAL LEFT VENTRICULAR SYSTOLIC FUNCTION  WITH MILD LVH  NORMAL RIGHT VENTRICULAR SYSTOLIC FUNCTION  MILD VALVULAR REGURGITATION (See above)  NO VALVULAR STENOSIS  ESTIMATED LVEF >55%  MILD AR  TRIVIAL TR, PR, MR  ECG 04/07/21:  Normal sinus rhythm  Normal ECG    Neuro/Psych PSYCHIATRIC DISORDERS Depression negative neurological ROS     GI/Hepatic GERD  ,  Endo/Other  Hypothyroidism Obesity   Renal/GU negative Renal ROS     Musculoskeletal  (+) Arthritis , Fibromyalgia -  Abdominal   Peds  Hematology  (+) Blood dyscrasia, anemia ,   Anesthesia Other Findings   Reproductive/Obstetrics                            Anesthesia Physical Anesthesia Plan  ASA: 3  Anesthesia Plan: General   Post-op Pain Management:    Induction: Intravenous  PONV Risk Score and Plan: 4 or greater and TIVA, Treatment may vary due to age or medical condition and Propofol infusion  Airway Management Planned:   Additional Equipment:   Intra-op Plan:   Post-operative Plan:   Informed Consent: I have reviewed the patients History and Physical, chart, labs and discussed the procedure including the risks, benefits and alternatives for the proposed anesthesia with the patient or authorized representative who has indicated  his/her understanding and acceptance.       Plan Discussed with: CRNA  Anesthesia Plan Comments:        Anesthesia Quick Evaluation

## 2021-08-18 NOTE — Transfer of Care (Signed)
Immediate Anesthesia Transfer of Care Note  Patient: Mary Irwin  Procedure(s) Performed: COLONOSCOPY WITH PROPOFOL ESOPHAGOGASTRODUODENOSCOPY (EGD) WITH PROPOFOL  Patient Location: PACU and Endoscopy Unit  Anesthesia Type:General  Level of Consciousness: drowsy  Airway & Oxygen Therapy: Patient Spontanous Breathing  Post-op Assessment: Report given to RN and Post -op Vital signs reviewed and stable  Post vital signs: Reviewed and stable  Last Vitals:  Vitals Value Taken Time  BP 140/76 08/18/21 1056  Temp 36.7 C 08/18/21 1056  Pulse 99 08/18/21 1057  Resp 30 08/18/21 1057  SpO2 90 % 08/18/21 1057  Vitals shown include unvalidated device data.  Last Pain:  Vitals:   08/18/21 1056  TempSrc: Temporal  PainSc: Asleep         Complications: No notable events documented.

## 2021-08-18 NOTE — Op Note (Signed)
[facility] Gastroenterology Patient Name: Mary Irwin Procedure Date: 08/18/2021 10:05 AM MRN: [id number] Account #: [id number] Date of Birth: 1951/05/31 Admit Type: Outpatient Age: 70 Room: [facility] ENDO ROOM 2 Gender: Female Note Status: Finalized Instrument Name: Upper Endoscope [id number] Procedure:             Upper GI endoscopy Indications:           Iron deficiency anemia secondary to chronic blood                         loss, Nausea Providers:             [name]. [name] MD, MD Referring MD:          Barbaraann Faster. Cannady (Referring MD) Medicines:             Propofol per Anesthesia Complications:         No immediate complications. Procedure:             Pre-Anesthesia Assessment:                        - The risks and benefits of the procedure and the                         sedation options and risks were discussed with the                         patient. All questions were answered and informed                         consent was obtained.                        - Patient identification and proposed procedure were                         verified prior to the procedure by the nurse. The                         procedure was verified in the procedure room.                        - ASA Grade Assessment: III - A patient with severe                         systemic disease.                        - After reviewing the risks and benefits, the patient                         was deemed in satisfactory condition to undergo the                         procedure.                        After obtaining informed consent, the endoscope was                         passed  under direct vision. Throughout the procedure,                         the patient's blood pressure, pulse, and oxygen                         saturations were monitored continuously. The Endoscope                         was introduced through the mouth, and advanced to the                          third part of duodenum. The upper GI endoscopy was                         accomplished without difficulty. The patient tolerated                         the procedure well. Findings:      The Z-line was irregular and was found at the gastroesophageal junction.       Mucosa was biopsied with a cold forceps for histology. One specimen       bottle was sent to pathology.      Multiple small sessile polyps with no bleeding and no stigmata of recent       bleeding were found in the gastric body. Biopsies were taken with a cold       forceps for histology. Verification of patient identification for the       specimen was done. Estimated blood loss: none.      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 Biopsies were taken with a cold forceps for       histology.      The examined duodenum was normal.      The exam was otherwise without abnormality. Impression:            - Z-line irregular, at the gastroesophageal junction.                         Biopsied.                        - Multiple gastric polyps. Biopsied.                        - Gastric antral vascular ectasia without bleeding.                         Biopsied.                        - Normal examined duodenum.                        - The examination was otherwise normal. Recommendation:        - Await pathology results.                        - Proceed with colonoscopy Procedure Code(s):     --- Professional ---  94503, Esophagogastroduodenoscopy, flexible,                         transoral; with biopsy, single or multiple Diagnosis Code(s):     --- Professional ---                        R11.0, Nausea                        D50.0, Iron deficiency anemia secondary to blood loss                         (chronic)                        K31.819, Angiodysplasia of stomach and duodenum                         without bleeding                         K31.7, Polyp of stomach and duodenum                        K22.8, Other specified diseases of esophagus CPT copyright 2019 American Medical Association. All rights reserved. The codes documented in this report are preliminary and upon coder review may  be revised to meet current compliance requirements. Efrain Sella MD, MD 08/18/2021 10:35:14 AM This report has been signed electronically. Number of Addenda: 0 Note Initiated On: 08/18/2021 10:05 AM Estimated Blood Loss:  Estimated blood loss was minimal.      [facility]

## 2021-08-18 NOTE — H&P (Signed)
Outpatient short stay form Pre-procedure 08/18/2021 10:06 AM Mary Irwin K. [name], M.D.  Primary Physician: Marnee Guarneri, NP  Reason for visit:  Symptomatic iron deficiency anemia  History of present illness:  She had an recent emergency room visit on April 16, 2021 for symptomatic anemia found on recent labs at cardiology. Hemoglobin returned at 6.8, associated with fatigue and shortness of breath. She was given 2 units and had hematology f/up. She reports generally having a stool daily and has not seen any blood in stool or black stool. She denies any constipation or diarrhea and reports she is having regular bowel movements. No frequent lower abd pain currently.   She does continue to have nausea, and occurs more frequently if she has not eaten for several hours. Seems to resolve if she will eat something such as some crackers. She denies any GERD or dysphagia. She does have occasional vomiting, last episode occurred after some watermelon. She does have early satiety and bloating as well similar to symptoms in past.   04/30/21-positive hemoccult card x 3.   04/23/21-CMP normal. CBC w/ Hgb 10, MCV 70. B12 170. Iron 27. Ferritin 12. High TIBC and low %sat. Sed rate 44.   - CSY: 05/2017 - diverticulosis in sigmoid and distal descending colon, redundant colon, non-thrombosed external hemorrhoids - EGD: 07/2017 - Z-line regular with GEJ biopsies showing reflux gastroesophagitis, normal stomach and duodenum.  - VCE: 03/2018 - small duodenal AVM, nonbleeding but superficial, 2 small submucosal varicosities and jejunum without evidence of bleeding    Current Facility-Administered Medications:    0.9 %  sodium chloride infusion, , Intravenous, Continuous, [name], [name], MD  Medications Prior to Admission  Medication Sig Dispense Refill Last Dose   Cholecalciferol (VITAMIN D3) 1.25 MG (50000 UT) CAPS TAKE ONE CAPSULE BY MOUTH ONCE A WEEK. 12 capsule 4 Past Week   diltiazem (TIAZAC) 300 MG 24 hr  capsule Take 300 mg by mouth daily.   08/17/2021   fluticasone furoate-vilanterol (BREO ELLIPTA) 200-25 MCG/INH AEPB Inhale 1 puff into the lungs daily. am   08/17/2021   gabapentin (NEURONTIN) 100 MG capsule Take 100 mg by mouth at bedtime.   08/17/2021   hydrochlorothiazide (HYDRODIURIL) 25 MG tablet Take 25 mg by mouth daily. am   08/17/2021   metoprolol succinate (TOPROL-XL) 25 MG 24 hr tablet Take 25 mg by mouth daily.   08/17/2021   ondansetron (ZOFRAN-ODT) 4 MG disintegrating tablet Take 4 mg by mouth every 8 (eight) hours as needed for nausea or vomiting.    at PRN   pantoprazole (PROTONIX) 40 MG tablet Take 40 mg by mouth 2 (two) times daily. Am and bedtime   08/17/2021   rosuvastatin (CRESTOR) 10 MG tablet Take 1 tablet (10 mg total) by mouth daily. 90 tablet 4 Past Week   albuterol (PROVENTIL HFA;VENTOLIN HFA) 108 (90 Base) MCG/ACT inhaler Inhale 2 puffs into the lungs every 6 (six) hours as needed for wheezing or shortness of breath.    at PRN   amitriptyline (ELAVIL) 10 MG tablet Take 10 mg by mouth at bedtime.      apixaban (ELIQUIS) 5 MG TABS tablet Take 5 mg by mouth 2 (two) times daily. Am and bedtime   08/15/2021     Allergies  Allergen Reactions   Lisinopril Swelling    After further review this may have been caused by Reglan   Metoclopramide Swelling     Past Medical History:  Diagnosis Date   Anemia    on  iron supplements   Arthritis    all over   Asthma    once a week   Atrial fibrillation (HCC)    Chronic seasonal allergic rhinitis due to pollen    Diverticulosis    Dysrhythmia    Atrial Fibrillation   Fibromyalgia    GERD (gastroesophageal reflux disease)    HLD (hyperlipidemia)    Hypertension    Hypothyroidism    no meds   IBS (irritable bowel syndrome)    PONV (postoperative nausea and vomiting)    Psychophysiological insomnia    PVC's (premature ventricular contractions)    Sinus trouble     Review of systems:  Otherwise negative.     Physical Exam  Gen: Alert, oriented. Appears stated age.  HEENT: Ramirez-Perez/AT. PERRLA. Lungs: CTA, no wheezes. CV: RR nl S1, S2. Abd: soft, benign, no masses. BS+ Ext: No edema. Pulses 2+    Planned procedures: Proceed with EGD and colonoscopy. The patient understands the nature of the planned procedure, indications, risks, alternatives and potential complications including but not limited to bleeding, infection, perforation, damage to internal organs and possible oversedation/side effects from anesthesia. The patient agrees and gives consent to proceed.  Please refer to procedure notes for findings, recommendations and patient disposition/instructions.     [name] K. [name], M.D. Gastroenterology 08/18/2021  10:06 AM

## 2021-08-19 ENCOUNTER — Encounter: Payer: Self-pay | Admitting: Internal Medicine

## 2021-08-19 LAB — SURGICAL PATHOLOGY

## 2021-08-19 NOTE — Progress Notes (Signed)
Transported patient to recovery, removed bite block from mouth, sherry CRNA and myself Ovidio Hanger observed patients mouth, teeth remained intact.

## 2021-08-31 ENCOUNTER — Other Ambulatory Visit: Payer: Self-pay

## 2021-08-31 ENCOUNTER — Telehealth: Payer: Self-pay

## 2021-08-31 ENCOUNTER — Encounter: Payer: Self-pay | Admitting: Nurse Practitioner

## 2021-08-31 ENCOUNTER — Ambulatory Visit (INDEPENDENT_AMBULATORY_CARE_PROVIDER_SITE_OTHER): Payer: Medicare HMO | Admitting: Nurse Practitioner

## 2021-08-31 VITALS — BP 138/63 | HR 90 | Temp 98.6°F | Wt 203.4 lb

## 2021-08-31 DIAGNOSIS — D509 Iron deficiency anemia, unspecified: Secondary | ICD-10-CM

## 2021-08-31 DIAGNOSIS — R109 Unspecified abdominal pain: Secondary | ICD-10-CM | POA: Insufficient documentation

## 2021-08-31 DIAGNOSIS — Z23 Encounter for immunization: Secondary | ICD-10-CM

## 2021-08-31 MED ORDER — DICYCLOMINE HCL 10 MG PO CAPS: 10.0000 mg | ORAL_CAPSULE | Freq: Three times a day (TID) | ORAL | 4 refills | Status: DC

## 2021-08-31 NOTE — Assessment & Plan Note (Addendum)
Chronic, stable, followed by hematology.  Patient had a colonoscopy and endoscopy, that showed that she had diverticulosis and polyps, some polyps were removed. Patient taking iron and gets B12 infusion monthly, continue these and collaboration with hematology.  Recent notes reviewed.

## 2021-08-31 NOTE — Telephone Encounter (Signed)
Prior Authorization was initiated via CoverMyMeds for Rx Bentyl. Awaiting determination.   KEY: [id number]

## 2021-08-31 NOTE — Telephone Encounter (Signed)
PA was approved for Bentyl via CoverMyMeds.  KEY: [id number]

## 2021-08-31 NOTE — Assessment & Plan Note (Addendum)
Ongoing for years.  ?element of IBS present.  Had colonoscopy and endoscopy done in October with no acute findings. Pain is felt in left lower quadrant and does not radiate. Will trial Bentyl TID with meals to see if offers benefit and recommend she start daily Metamucil to offer benefit to bowel regimen.  If ongoing will have return to GI for further assessment.

## 2021-08-31 NOTE — Patient Instructions (Signed)
Diet for Irritable Bowel Syndrome When you have irritable bowel syndrome (IBS), it is very important to eat the foods and follow the eating habits that are best for your condition. IBS may cause various symptoms such as pain in the abdomen, constipation, or diarrhea. Choosing the right foods can help to ease the discomfort from these symptoms. Work with your health care provider and diet and nutrition specialist (dietitian) to find the eating plan that will help to control your symptoms. What are tips for following this plan?   Keep a food diary. This will help you identify foods that cause symptoms. Write down: What you eat and when you eat it. What symptoms you have. When symptoms occur in relation to your meals, such as "pain in abdomen 2 hours after dinner." Eat your meals slowly and in a relaxed setting. Aim to eat 5-6 small meals per day. Do not skip meals. Drink enough fluid to keep your urine pale yellow. Ask your health care provider if you should take an over-the-counter probiotic to help restore healthy bacteria in your gut (digestive tract). Probiotics are foods that contain good bacteria and yeasts. Your dietitian may have specific dietary recommendations for you based on your symptoms. He or she may recommend that you: Avoid foods that cause symptoms. Talk with your dietitian about other ways to get the same nutrients that are in those problem foods. Avoid foods with gluten. Gluten is a protein that is found in rye, wheat, and barley. Eat more foods that contain soluble fiber. Examples of foods with high soluble fiber include oats, seeds, and certain fruits and vegetables. Take a fiber supplement if directed by your dietitian. Reduce or avoid certain foods called FODMAPs. These are foods that contain carbohydrates that are hard to digest. Ask your doctor which foods contain these carbohydrates. What foods are not recommended? The following are some foods and drinks that may make your  symptoms worse: Fatty foods, such as french fries. Foods that contain gluten, such as pasta and cereal. Dairy products, such as milk, cheese, and ice cream. Chocolate. Alcohol. Products with caffeine, such as coffee. Carbonated drinks, such as soda. Foods that are high in FODMAPs. These include certain fruits and vegetables. Products with sweeteners such as honey, high fructose corn syrup, sorbitol, and mannitol. The items listed above may not be a complete list of foods and beverages you should avoid. Contact a dietitian for more information. What foods are good sources of fiber? Your health care provider or dietitian may recommend that you eat more foods that contain fiber. Fiber can help to reduce constipation and other IBS symptoms. Add foods with fiber to your diet a little at a time so your body can get used to them. Too much fiber at one time might cause gas and swelling of your abdomen. The following are some foods that are good sources of fiber: Berries, such as raspberries, strawberries, and blueberries. Tomatoes. Carrots. Brown rice. Oats. Seeds, such as chia and pumpkin seeds. The items listed above may not be a complete list of recommended sources of fiber. Contact your dietitian for more options. Where to find more information International Foundation for Functional Gastrointestinal Disorders: www.iffgd.[employer] of Diabetes and Digestive and Kidney Diseases: [employer] Summary When you have irritable bowel syndrome (IBS), it is very important to eat the foods and follow the eating habits that are best for your condition. IBS may cause various symptoms such as pain in the abdomen, constipation, or diarrhea. Choosing the right  foods can help to ease the discomfort that comes from symptoms. Keep a food diary. This will help you identify foods that cause symptoms. Your health care provider or diet and nutrition specialist (dietitian) may recommend that you  eat more foods that contain fiber. This information is not intended to replace advice given to you by your health care provider. Make sure you discuss any questions you have with your health care provider. Document Revised: 06/25/2020 Document Reviewed: 06/25/2020 Elsevier Patient Education  2022 [employer].

## 2021-08-31 NOTE — Progress Notes (Signed)
BP 138/63   Pulse 90   Temp 98.6 F (37 C) (Oral)   Wt 203 lb 6.4 oz (92.3 kg)   SpO2 96%   BMI 34.91 kg/m    Subjective:    Patient ID: Mary Irwin, female    DOB: Dec 17, 1950, 70 y.o.   MRN: [id number]  Chief Complaint  Patient presents with   Anemia    Patient is here to follow up on Anemia. f   Follow-up    Patient states she is here to follow up on her recent Colonoscopy and upper endoscopy. Patient is also asking if provider will refill one of her prescription.    NOTE WRITTEN BY [employer] DNP STUDENT.  ASSESSMENT AND PLAN OF CARE REVIEWED WITH STUDENT, AGREE WITH ABOVE FINDINGS AND PLAN.   HPI: Mary Irwin is a 70 y.o. female, here for a follow up for anemia. States she is compliant with her medication. Has had abdominal pain for years now but recently pain is getting worse and making her uncomfortable. No pain today.  ANEMIA Anemia status: better Etiology of anemia: Duration of anemia treatment:  Compliance with treatment: good compliance Iron supplementation side effects: no Severity of anemia: mild Fatigue: no Decreased exercise tolerance: no  Dyspnea on exertion: no Palpitations: no Bleeding: no Pica: no   ABDOMINAL PAIN  Has been having abdominal pain for several years that is becoming more painful since recently. Pain is usually at left lower quadrant and does not radiate. Describes pain as cramping that comes on mostly when she is feeling sick to her stomach. No pain today.  Diverticulosis and internal hemorrhoids noted on recent colonoscopy, no inflammation. Duration:months Onset: gradual Severity: moderate Quality: cramping, tearing, and throbbing Location:  LLQ  Episode duration:  Radiation: no Frequency: intermittent Alleviating factors:  Aggravating factors: Status: worse Treatments attempted: none Fever: no Nausea: no Vomiting: no Weight loss: no Decreased appetite: no Diarrhea: no Constipation: no Blood in stool: no Heartburn:  no Jaundice: no Rash: no Dysuria/urinary frequency: no Hematuria: no History of sexually transmitted disease: no Recurrent NSAID use: no    Relevant past medical, surgical, family and social history reviewed and updated as indicated. Interim medical history since our last visit reviewed. Allergies and medications reviewed and updated.  Review of Systems  Constitutional:  Negative for appetite change, fatigue and fever.  Respiratory:  Negative for shortness of breath.   Cardiovascular:  Negative for chest pain and leg swelling.  Gastrointestinal:  Positive for abdominal pain. Negative for blood in stool, constipation, diarrhea, nausea and vomiting.       Left lower quadrant  Neurological:  Negative for dizziness, tremors and numbness.   Per HPI unless specifically indicated above     Objective:    BP 138/63   Pulse 90   Temp 98.6 F (37 C) (Oral)   Wt 203 lb 6.4 oz (92.3 kg)   SpO2 96%   BMI 34.91 kg/m   Wt Readings from Last 3 Encounters:  08/31/21 203 lb 6.4 oz (92.3 kg)  08/18/21 198 lb 6.6 oz (90 kg)  07/15/21 204 lb (92.5 kg)    Physical Exam Vitals reviewed.  Constitutional:      General: She is not in acute distress.    Appearance: Normal appearance. She is not ill-appearing or toxic-appearing.  HENT:     Head: Normocephalic.  Neck:     Thyroid: No thyroid mass, thyromegaly or thyroid tenderness.     Vascular: No carotid bruit.  Trachea: Trachea normal.  Cardiovascular:     Rate and Rhythm: Normal rate and regular rhythm.     Heart sounds: Normal heart sounds, S1 normal and S2 normal.  Pulmonary:     Effort: Pulmonary effort is normal.     Breath sounds: Normal breath sounds and air entry.  Abdominal:     General: A surgical scar is present. Bowel sounds are normal. There is no distension.     Palpations: Abdomen is soft. There is no mass.     Tenderness: There is no abdominal tenderness.     Comments: Surgical scar midline  Lymphadenopathy:      Cervical: No cervical adenopathy.     Right cervical: No superficial, deep or posterior cervical adenopathy.    Left cervical: No superficial, deep or posterior cervical adenopathy.  Skin:    General: Skin is warm.  Neurological:     Mental Status: She is alert.  Psychiatric:        Behavior: Behavior is cooperative.    Results for orders placed or performed during the hospital encounter of 08/18/21  Surgical pathology  Result Value Ref Range   SURGICAL PATHOLOGY      SURGICAL PATHOLOGY CASE: [id number] PATIENT: Mary Irwin Surgical Pathology Report     Specimen Submitted: A. Stomach, antrum; cbx B. Stomach polyp, body; cbx C. GEJ; cbx D. Colon polyp, transverse; cbx  Clinical History: Fe def anemia D50.9. Gastritis, irregular z-line, gastritic p9olyp, internal and external hemorrhoids, diverticulosis, colon polyp      DIAGNOSIS: A. 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 - NEGATIVE FOR H. PYLORI, INTESTINAL METAPLASIA, DYSPLASIA, AND MALIGNANCY.  B.  STOMACH POLYP, BODY; COLD BIOPSY: - FUNDIC GLAND POLYP. - NEGATIVE FOR H. PYLORI, INTESTINAL METAPLASIA, DYSPLASIA, AND MALIGNANCY.  C.  GEJ; COLD BIOPSY: - SQUAMOCOLUMNAR JUNCTIONAL MUCOSA WITH REFLUX GASTROESOPHAGITIS. - NEGATIVE FOR INTESTINAL METAPLASIA, DYSPLASIA, AND MALIGNANCY.  D.  COLON POLYP, TRANSVERSE; COLD BIOPSY: - TUBULAR ADENOMA. - NEGATIVE FOR HIGH-GRADE DYSPLASIA AND MALIGNANCY.  GROSS  DESCRIPTION: A. Labeled: cbx antrum rule out GAVE Received: Formalin Collection time: 10:28 AM on 08/18/2021 Placed into formalin time: 10:28 AM on 08/18/2021 Tissue fragment(s): Multiple Size: Aggregate, 1.0 x 0.3 x 0.1 cm Description: Pink soft tissue fragments Entirely submitted in 1 cassette.  B. Labeled: cbx gastric body poly rule out adenoma Received: Formalin Collection time: 10:30 AM on 08/18/2021 Placed into formalin time: 10:30 AM  on 08/18/2021 Tissue fragment(s): 1 Size: 0.2 x 0.2 x 0.1 cm Description: Tan soft tissue fragment Entirely submitted in 1 cassette.  C. Labeled: cbx GE junction rule out Barrett's Received: Formalin Collection time: 10:30 AM on 08/18/2021 Placed into formalin time: 10:30 AM on 08/18/2021 Tissue fragment(s): 1 Size: 0.3 x 0.2 x 0.1 cm Description: Tan soft tissue fragment Entirely submitted in 1 cassette.  D. Labeled: cbx transverse colon polyp Received: Formalin Collection time: 10:47 AM on 08/18/2021 Placed into for malin time: 10:47 AM on 08/18/2021 Tissue fragment(s): 2 Size: Ranges from 0.1-0.4 cm Description: Tan soft tissue fragments Entirely submitted in 1 cassette.  [facility] 08/18/2021  Final Diagnosis performed by Quay Burow, MD.   Electronically signed 08/19/2021 10:21:54AM The electronic signature indicates that the named Attending Pathologist has evaluated the specimen Technical component performed at [facility], [address], [place], [place] [address] Lab: [phone] Dir: Rush Farmer, MD, MMM  Professional component performed at [facility], [facility], [place], [place], [place] [address] Lab: [place].  Ronnald Ramp, MD       Assessment & Plan:   Problem List Items Addressed This Visit       Other   Iron deficiency anemia - Primary    Chronic, stable, followed by hematology.  Patient had a colonoscopy and endoscopy, that showed that she had diverticulosis and polyps, some polyps were removed. Patient taking iron and gets B12 infusion monthly, continue these and collaboration with hematology.  Recent notes reviewed.      Abdominal pain of unknown etiology ? IBS    Ongoing for years.  ?element of IBS present.  Had colonoscopy and endoscopy done in October with no acute findings. Pain is felt in left lower quadrant and does not radiate. Will trial Bentyl TID with meals to see if offers benefit and recommend she start daily  Metamucil to offer benefit to bowel regimen.  If ongoing will have return to GI for further assessment.      Other Visit Diagnoses     Needs flu shot       got in office today   Relevant Orders   Flu Vaccine QUAD High Dose(Fluad) (Completed)        Follow up plan: Return in about 3 months (around 12/01/2021) for HTN/HLD, GERD, THYROID, ASTHMA.

## 2021-09-09 ENCOUNTER — Telehealth: Payer: Self-pay | Admitting: Internal Medicine

## 2021-09-09 ENCOUNTER — Inpatient Hospital Stay: Payer: Medicare HMO

## 2021-09-09 NOTE — Telephone Encounter (Signed)
Looks like [employer] cnl the apt. Pt also called triage. Pt was down for a b12 injection only. This will need to be r/s when pt is able. Thanks.

## 2021-09-09 NOTE — Telephone Encounter (Signed)
Pt called in to cancel appt for today. She is sick. Call and reschedule at [phone]

## 2021-09-10 ENCOUNTER — Ambulatory Visit (INDEPENDENT_AMBULATORY_CARE_PROVIDER_SITE_OTHER): Payer: Medicare HMO

## 2021-09-10 VITALS — Ht 62.0 in | Wt 200.0 lb

## 2021-09-10 DIAGNOSIS — Z0000 Encounter for general adult medical examination without abnormal findings: Secondary | ICD-10-CM | POA: Diagnosis not present

## 2021-09-10 NOTE — Patient Instructions (Signed)
Mary Irwin , Thank you for taking time to come for your Medicare Wellness Visit. I appreciate your ongoing commitment to your health goals. Please review the following plan we discussed and let me know if I can assist you in the future.   Screening recommendations/referrals: Colonoscopy: completed 08/18/2021 Mammogram: decline Bone Density: completed 02/20/2017 Recommended yearly ophthalmology/optometry visit for glaucoma screening and checkup Recommended yearly dental visit for hygiene and checkup  Vaccinations: Influenza vaccine: completed 08/31/2021 Pneumococcal vaccine: completed 12/10/2018 Tdap vaccine: completed 01/22/2020, due 01/21/2030 Shingles vaccine: discussed   Covid-19: 11/18/2020, 02/27/2020, 02/06/2020  Advanced directives: Advance directive discussed with you today.   Conditions/risks identified: none  Next appointment: Follow up in one year for your annual wellness visit    Preventive Care 65 Years and Older, Female Preventive care refers to lifestyle choices and visits with your health care provider that can promote health and wellness. What does preventive care include? A yearly physical exam. This is also called an annual well check. Dental exams once or twice a year. Routine eye exams. Ask your health care provider how often you should have your eyes checked. Personal lifestyle choices, including: Daily care of your teeth and gums. Regular physical activity. Eating a healthy diet. Avoiding tobacco and drug use. Limiting alcohol use. Practicing safe sex. Taking low-dose aspirin every day. Taking vitamin and mineral supplements as recommended by your health care provider. What happens during an annual well check? The services and screenings done by your health care provider during your annual well check will depend on your age, overall health, lifestyle risk factors, and family history of disease. Counseling  Your health care provider may ask you questions about  your: Alcohol use. Tobacco use. Drug use. Emotional well-being. Home and relationship well-being. Sexual activity. Eating habits. History of falls. Memory and ability to understand (cognition). Work and work Statistician. Reproductive health. Screening  You may have the following tests or measurements: Height, weight, and BMI. Blood pressure. Lipid and cholesterol levels. These may be checked every 5 years, or more frequently if you are over 24 years old. Skin check. Lung cancer screening. You may have this screening every year starting at age 61 if you have a 30-pack-year history of smoking and currently smoke or have quit within the past 15 years. Fecal occult blood test (FOBT) of the stool. You may have this test every year starting at age 3. Flexible sigmoidoscopy or colonoscopy. You may have a sigmoidoscopy every 5 years or a colonoscopy every 10 years starting at age 50. Hepatitis C blood test. Hepatitis B blood test. Sexually transmitted disease (STD) testing. Diabetes screening. This is done by checking your blood sugar (glucose) after you have not eaten for a while (fasting). You may have this done every 1-3 years. Bone density scan. This is done to screen for osteoporosis. You may have this done starting at age 13. Mammogram. This may be done every 1-2 years. Talk to your health care provider about how often you should have regular mammograms. Talk with your health care provider about your test results, treatment options, and if necessary, the need for more tests. Vaccines  Your health care provider may recommend certain vaccines, such as: Influenza vaccine. This is recommended every year. Tetanus, diphtheria, and acellular pertussis (Tdap, Td) vaccine. You may need a Td booster every 10 years. Zoster vaccine. You may need this after age 70. Pneumococcal 13-valent conjugate (PCV13) vaccine. One dose is recommended after age 59. Pneumococcal polysaccharide (PPSV23) vaccine.  One dose  is recommended after age 16. Talk to your health care provider about which screenings and vaccines you need and how often you need them. This information is not intended to replace advice given to you by your health care provider. Make sure you discuss any questions you have with your health care provider. Document Released: 11/20/2015 Document Revised: 07/13/2016 Document Reviewed: 08/25/2015 Elsevier Interactive Patient Education  2017 [place] Prevention in the Home Falls can cause injuries. They can happen to people of all ages. There are many things you can do to make your home safe and to help prevent falls. What can I do on the outside of my home? Regularly fix the edges of walkways and driveways and fix any cracks. Remove anything that might make you trip as you walk through a door, such as a raised step or threshold. Trim any bushes or trees on the path to your home. Use bright outdoor lighting. Clear any walking paths of anything that might make someone trip, such as rocks or tools. Regularly check to see if handrails are loose or broken. Make sure that both sides of any steps have handrails. Any raised decks and porches should have guardrails on the edges. Have any leaves, snow, or ice cleared regularly. Use sand or salt on walking paths during winter. Clean up any spills in your garage right away. This includes oil or grease spills. What can I do in the bathroom? Use night lights. Install grab bars by the toilet and in the tub and shower. Do not use towel bars as grab bars. Use non-skid mats or decals in the tub or shower. If you need to sit down in the shower, use a plastic, non-slip stool. Keep the floor dry. Clean up any water that spills on the floor as soon as it happens. Remove soap buildup in the tub or shower regularly. Attach bath mats securely with double-sided non-slip rug tape. Do not have throw rugs and other things on the floor that can make  you trip. What can I do in the bedroom? Use night lights. Make sure that you have a light by your bed that is easy to reach. Do not use any sheets or blankets that are too big for your bed. They should not hang down onto the floor. Have a firm chair that has side arms. You can use this for support while you get dressed. Do not have throw rugs and other things on the floor that can make you trip. What can I do in the kitchen? Clean up any spills right away. Avoid walking on wet floors. Keep items that you use a lot in easy-to-reach places. If you need to reach something above you, use a strong step stool that has a grab bar. Keep electrical cords out of the way. Do not use floor polish or wax that makes floors slippery. If you must use wax, use non-skid floor wax. Do not have throw rugs and other things on the floor that can make you trip. What can I do with my stairs? Do not leave any items on the stairs. Make sure that there are handrails on both sides of the stairs and use them. Fix handrails that are broken or loose. Make sure that handrails are as long as the stairways. Check any carpeting to make sure that it is firmly attached to the stairs. Fix any carpet that is loose or worn. Avoid having throw rugs at the top or bottom of the stairs. If  you do have throw rugs, attach them to the floor with carpet tape. Make sure that you have a light switch at the top of the stairs and the bottom of the stairs. If you do not have them, ask someone to add them for you. What else can I do to help prevent falls? Wear shoes that: Do not have high heels. Have rubber bottoms. Are comfortable and fit you well. Are closed at the toe. Do not wear sandals. If you use a stepladder: Make sure that it is fully opened. Do not climb a closed stepladder. Make sure that both sides of the stepladder are locked into place. Ask someone to hold it for you, if possible. Clearly mark and make sure that you can  see: Any grab bars or handrails. First and last steps. Where the edge of each step is. Use tools that help you move around (mobility aids) if they are needed. These include: Canes. Walkers. Scooters. Crutches. Turn on the lights when you go into a dark area. Replace any light bulbs as soon as they burn out. Set up your furniture so you have a clear path. Avoid moving your furniture around. If any of your floors are uneven, fix them. If there are any pets around you, be aware of where they are. Review your medicines with your doctor. Some medicines can make you feel dizzy. This can increase your chance of falling. Ask your doctor what other things that you can do to help prevent falls. This information is not intended to replace advice given to you by your health care provider. Make sure you discuss any questions you have with your health care provider. Document Released: 08/20/2009 Document Revised: 03/31/2016 Document Reviewed: 11/28/2014 Elsevier Interactive Patient Education  2017 [employer].

## 2021-09-10 NOTE — Addendum Note (Signed)
Addended by: Glenna Durand E on: 09/10/2021 02:20 PM   Modules accepted: Orders

## 2021-09-10 NOTE — Progress Notes (Signed)
I connected with Gloristine Turrubiates today by telephone and verified that I am speaking with the correct person using two identifiers. Location patient: home Location provider: work Persons participating in the virtual visit: Breyana Follansbee, Glenna Durand LPN.   I discussed the limitations, risks, security and privacy concerns of performing an evaluation and management service by telephone and the availability of in person appointments. I also discussed with the patient that there may be a patient responsible charge related to this service. The patient expressed understanding and verbally consented to this telephonic visit.    Interactive audio and video telecommunications were attempted between this provider and patient, however failed, due to patient having technical difficulties OR patient did not have access to video capability.  We continued and completed visit with audio only.     Vital signs may be patient reported or missing.  Subjective:   Mary Irwin is a 70 y.o. female who presents for an Initial Medicare Annual Wellness Visit.  Review of Systems     Cardiac Risk Factors include: advanced age (>80men, >50 women);dyslipidemia;hypertension;obesity (BMI >30kg/m2);sedentary lifestyle     Objective:    Today's Vitals   09/10/21 1349 09/10/21 1351  Weight: 200 lb (90.7 kg)   Height: 5\' 2"  (1.575 m)   PainSc:  5    Body mass index is 36.58 kg/m.  Advanced Directives 09/10/2021 08/18/2021 06/11/2021 05/20/2021 04/22/2021 04/16/2021 01/03/2020  Does Patient Have a Medical Advance Directive? No No No No No No No  Would patient like information on creating a medical advance directive? - - No - Patient declined No - Patient declined No - Patient declined - -    Current Medications (verified) Outpatient Encounter Medications as of 09/10/2021  Medication Sig   albuterol (PROVENTIL HFA;VENTOLIN HFA) 108 (90 Base) MCG/ACT inhaler Inhale 2 puffs into the lungs every 6 (six) hours as needed for  wheezing or shortness of breath.   amitriptyline (ELAVIL) 10 MG tablet TAKE 1 TABLET(10 MG) BY MOUTH EVERY NIGHT   apixaban (ELIQUIS) 5 MG TABS tablet Take 5 mg by mouth 2 (two) times daily. Am and bedtime   Cholecalciferol (VITAMIN D3) 1.25 MG (50000 UT) CAPS TAKE ONE CAPSULE BY MOUTH ONCE A WEEK.   diltiazem (CARDIZEM CD) 300 MG 24 hr capsule Take 300 mg by mouth daily.   diltiazem (TIAZAC) 300 MG 24 hr capsule Take 300 mg by mouth daily.   fluticasone furoate-vilanterol (BREO ELLIPTA) 200-25 MCG/INH AEPB Inhale 1 puff into the lungs daily. am   hydrochlorothiazide (HYDRODIURIL) 25 MG tablet Take 25 mg by mouth daily. am   metoprolol succinate (TOPROL-XL) 25 MG 24 hr tablet Take 25 mg by mouth daily.   ondansetron (ZOFRAN-ODT) 4 MG disintegrating tablet Take 4 mg by mouth every 8 (eight) hours as needed for nausea or vomiting.   pantoprazole (PROTONIX) 40 MG tablet Take 40 mg by mouth 2 (two) times daily. Am and bedtime   amitriptyline (ELAVIL) 10 MG tablet Take 10 mg by mouth at bedtime.   dicyclomine (BENTYL) 10 MG capsule Take 1 capsule (10 mg total) by mouth 3 (three) times daily before meals. (Patient not taking: Reported on 09/10/2021)   gabapentin (NEURONTIN) 100 MG capsule Take 100 mg by mouth at bedtime. (Patient not taking: Reported on 09/10/2021)   rosuvastatin (CRESTOR) 10 MG tablet Take 1 tablet (10 mg total) by mouth daily. (Patient not taking: Reported on 09/10/2021)   No facility-administered encounter medications on file as of 09/10/2021.    Allergies (verified)  Lisinopril and Metoclopramide   History: Past Medical History:  Diagnosis Date   Anemia    on iron supplements   Arthritis    all over   Asthma    once a week   Atrial fibrillation (HCC)    Chronic seasonal allergic rhinitis due to pollen    Diverticulosis    Dysrhythmia    Atrial Fibrillation   Fibromyalgia    GERD (gastroesophageal reflux disease)    HLD (hyperlipidemia)    Hypertension     Hypothyroidism    no meds   IBS (irritable bowel syndrome)    PONV (postoperative nausea and vomiting)    Psychophysiological insomnia    PVC's (premature ventricular contractions)    Sinus trouble    Past Surgical History:  Procedure Laterality Date   BACK SURGERY     CATARACT EXTRACTION W/PHACO Right 04/22/2019   Procedure: CATARACT EXTRACTION PHACO AND INTRAOCULAR LENS PLACEMENT ([place]) RIGHT;  Surgeon: Eulogio Bear, MD;  Location: [place];  Service: Ophthalmology;  Laterality: Right;   CATARACT EXTRACTION W/PHACO Left 05/27/2019   Procedure: CATARACT EXTRACTION PHACO AND INTRAOCULAR LENS PLACEMENT ([place])  LEFT;  Surgeon: Eulogio Bear, MD;  Location: [place];  Service: Ophthalmology;  Laterality: Left;   CHOLECYSTECTOMY N/A 09/22/2017   Procedure: LAPAROSCOPIC CHOLECYSTECTOMY WITH INTRAOPERATIVE CHOLANGIOGRAM;  Surgeon: Olean Ree, MD;  Location: [facility];  Service: General;  Laterality: N/A;   COLONOSCOPY WITH PROPOFOL N/A 05/23/2017   Procedure: COLONOSCOPY WITH PROPOFOL;  Surgeon: Lollie Sails, MD;  Location[facility];  Service: Endoscopy;  Laterality: N/A;   COLONOSCOPY WITH PROPOFOL N/A 08/18/2021   Procedure: COLONOSCOPY WITH PROPOFOL;  Surgeon: Toledo, [name], MD;  Location: [facility] ENDOSCOPY;  Service: Gastroenterology;  Laterality: N/A;   ESOPHAGOGASTRODUODENOSCOPY (EGD) WITH PROPOFOL N/A 05/23/2017   Procedure: ESOPHAGOGASTRODUODENOSCOPY (EGD) WITH PROPOFOL;  Surgeon: Lollie Sails, MD;  Location[facility] END[facility]COPY;  Service: Endoscopy;  Laterality: N/A;   ESOPHAGOGASTRODUODENOSCOPY (EGD) WITH PROPOFOL N/A 07/31/2017   Procedure: ESOPHAGOGASTRODUODENOSCOPY (EGD) WITH PROPOFOL;  Surgeon: Lollie Sails, MD;  Location: [facility];  Service: Endoscopy;  Laterality: N/A;   ESOPHAGOGASTRODUODENOSCOPY (EGD) WITH PROPOFOL N/A 08/18/2021   Procedure: ESOPHAGOGASTRODUODENOSCOPY (EGD) WITH PROPOFOL;  Surgeon: Toledo, [name],  MD;  Location: [facility] ENDOSCOPY;  Service: Gastroenterology;  Laterality: N/A;   EYE SURGERY     GIVENS CAPSULE STUDY     HERNIA REPAIR     umbilical   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   JOINT REPLACEMENT Bilateral    total hip replacement   Family History  Problem Relation Age of Onset   Breast cancer Maternal Aunt 87   Hypertension Mother    Social History   Socioeconomic History   Marital status: Divorced    Spouse name: Not on file   Number of children: Not on file   Years of education: Not on file   Highest education level: Not on file  Occupational History   Not on file  Tobacco Use   Smoking status: Never   Smokeless tobacco: Never  Vaping Use   Vaping Use: Never used  Substance and Sexual Activity   Alcohol use: No   Drug use: No   Sexual activity: Not on file  Other Topics Concern   Not on file  Social History Narrative   Not on file   Social Determinants of Health   Financial Resource Strain: Medium Risk   Difficulty of Paying Living Expenses: Somewhat hard  Food  Insecurity: No Food Insecurity   Worried About Charity fundraiser in the Last Year: Never true   Ran Out of Food in the Last Year: Never true  Transportation Needs: No Transportation Needs   Lack of Transportation (Medical): No   Lack of Transportation (Non-Medical): No  Physical Activity: Inactive   Days of Exercise per Week: 0 days   Minutes of Exercise per Session: 0 min  Stress: No Stress Concern Present   Feeling of Stress : Not at all  Social Connections: Not on file    Tobacco Counseling Counseling given: Not Answered   Clinical Intake:  Pre-visit preparation completed: Yes  Pain : 0-10 Pain Score: 5  Pain Type: Acute pain Pain Location: Abdomen Pain Descriptors / Indicators: Sharp Pain Onset: 1 to 4 weeks ago Pain Frequency: Intermittent     Nutritional Status: BMI > 30  Obese Nutritional Risks: Nausea/ vomitting/ diarrhea (nausea yesterday and  this morning)  How often do you need to have someone help you when you read instructions, pamphlets, or other written materials from your doctor or pharmacy?: 1 - Never What is the last grade level you completed in school?: 12th grade  Diabetic? no  Interpreter Needed?: No  Information entered by :: NAllen LPN   Activities of Daily Living In your present state of health, do you have any difficulty performing the following activities: 09/10/2021 04/23/2021  Hearing? N N  Vision? N Y  Difficulty concentrating or making decisions? N N  Walking or climbing stairs? Y Y  Dressing or bathing? N N  Doing errands, shopping? N N  Preparing Food and eating ? N -  Using the Toilet? N -  In the past six months, have you accidently leaked urine? N -  Do you have problems with loss of bowel control? N -  Managing your Medications? N -  Managing your Finances? N -  Housekeeping or managing your Housekeeping? N -  Some recent data might be hidden    Patient Care Team: Venita Lick, NP as PCP - General (Nurse Practitioner) Cammie Sickle, MD as Consulting Physician (Hematology and Oncology)  Indicate any recent Medical Services you may have received from other than [facility] providers in the past year (date may be approximate).     Assessment:   This is a routine wellness examination for Mary Irwin.  Hearing/Vision screen Vision Screening - Comments:: Regular eye exams, [facility]  Dietary issues and exercise activities discussed: Current Exercise Habits: The patient does not participate in regular exercise at present   Goals Addressed             This Visit's Progress    Patient Stated       09/10/2021, wants to get stomach worked out       Depression Screen PHQ 2/9 Scores 09/10/2021 04/23/2021 04/16/2021  PHQ - 2 Score 0 0 0  PHQ- 9 Score - - 0    Fall Risk Fall Risk  09/10/2021 04/23/2021  Falls in the past year? 0 0  Number falls in past yr: - 0  Injury with  Fall? - 0  Risk for fall due to : Medication side effect No Fall Risks  Follow up Falls evaluation completed;Education provided;Falls prevention discussed Falls evaluation completed    FALL RISK PREVENTION PERTAINING TO THE HOME:  Any stairs in or around the home? Yes  If so, are there any without handrails? No  Home free of loose throw rugs in walkways, pet  beds, electrical cords, etc? Yes  Adequate lighting in your home to reduce risk of falls? Yes   ASSISTIVE DEVICES UTILIZED TO PREVENT FALLS:  Life alert? No  Use of a cane, walker or w/c? No  Grab bars in the bathroom? No  Shower chair or bench in shower? No  Elevated toilet seat or a handicapped toilet? No   TIMED UP AND GO:  Was the test performed? No .      Cognitive Function:     6CIT Screen 09/10/2021  What Year? 0 points  What month? 0 points  What time? 0 points  Count back from 20 0 points  Months in reverse 0 points  Repeat phrase 2 points  Total Score 2    Immunizations Immunization History  Administered Date(s) Administered   Fluad Quad(high Dose 65+) 07/24/2019, 08/31/2021   Influenza, High Dose Seasonal PF 07/13/2017, 08/10/2018, 08/11/2020   Influenza-Unspecified 10/06/2016, 08/09/2018   PFIZER(Purple Top)SARS-COV-2 Vaccination 02/06/2020, 02/27/2020, 11/18/2020   Pneumococcal Conjugate-13 02/01/2017, 07/13/2017   Pneumococcal Polysaccharide-23 12/10/2018   Tdap 12/26/2016, 01/22/2020    TDAP status: Up to date  Flu Vaccine status: Up to date  Pneumococcal vaccine status: Up to date  Covid-19 vaccine status: Completed vaccines  Qualifies for Shingles Vaccine? Yes   Zostavax completed No   Shingrix Completed?: No.    Education has been provided regarding the importance of this vaccine. Patient has been advised to call insurance company to determine out of pocket expense if they have not yet received this vaccine. Advised may also receive vaccine at local pharmacy or Health Dept. Verbalized  acceptance and understanding.  Screening Tests Health Maintenance  Topic Date Due   COVID-19 Vaccine (4 - Booster for Pfizer series) 01/13/2021   Zoster Vaccines- Shingrix (1 of 2) 12/01/2021 (Originally 03/07/1970)   MAMMOGRAM  04/16/2022 (Originally 01/06/2019)   Hepatitis C Screening  04/16/2022 (Originally [birth year]07/1969)   TETANUS/TDAP  01/21/2030   COLONOSCOPY (Pts 45-73yrs Insurance coverage will need to be confirmed)  08/19/2031   Pneumonia Vaccine 4+ Years old  Completed   INFLUENZA VACCINE  Completed   DEXA SCAN  Completed   HPV [place] Maintenance Due  Topic Date Due   COVID-19 Vaccine (4 - Booster for [place] series) 01/13/2021    Colorectal cancer screening: Type of screening: Colonoscopy. Completed 08/18/2021. Repeat every 5 years  Mammogram status: decline  Bone Density status: Completed 02/20/2017.   Lung Cancer Screening: (Low Dose CT Chest recommended if Age 24-80 years, 30 pack-year currently smoking OR have quit w/in 15years.) does not qualify.   Lung Cancer Screening Referral: no  Additional Screening:  Hepatitis C Screening: does qualify; decline  Vision Screening: Recommended annual ophthalmology exams for early detection of glaucoma and other disorders of the eye. Is the patient up to date with their annual eye exam?  Yes  Who is the provider or what is the name of the office in which the patient attends annual eye exams? [facility] If pt is not established with a provider, would they like to be referred to a provider to establish care? No .   Dental Screening: Recommended annual dental exams for proper oral hygiene  Community Resource Referral / Chronic Care Management: CRR required this visit?  No   CCM required this visit?  No      Plan:     I have personally reviewed and noted the following in the patient's chart:  Medical and social history Use of alcohol, tobacco or illicit drugs  Current  medications and supplements including opioid prescriptions. Patient is not currently taking opioid prescriptions. Functional ability and status Nutritional status Physical activity Advanced directives List of other physicians Hospitalizations, surgeries, and ER visits in previous 12 months Vitals Screenings to include cognitive, depression, and falls Referrals and appointments  In addition, I have reviewed and discussed with patient certain preventive protocols, quality metrics, and best practice recommendations. A written personalized care plan for preventive services as well as general preventive health recommendations were provided to patient.     Kellie Simmering, LPN   16/11/958   Nurse Notes: States still having trouble with her stomach. Says she will reach out to her gastroenterologist.

## 2021-09-23 ENCOUNTER — Telehealth: Payer: Self-pay

## 2021-09-23 NOTE — Telephone Encounter (Signed)
   Telephone encounter was:  Unsuccessful.  09/23/2021 Name: Mary Irwin MRN: [id number] DOB: 11-May-1951  Unsuccessful outbound call made today to assist with:  Food Insecurity  Outreach Attempt:  1st Attempt  A HIPAA compliant voice message was left requesting a return call.  Instructed patient to call back at [phone] at their earliest convenience.  [facility] management  [place], [place]  Main Phone: [phone]  E-mail: [email or web address]  Website: [email or web address]

## 2021-09-27 ENCOUNTER — Telehealth: Payer: Self-pay

## 2021-09-27 NOTE — Telephone Encounter (Signed)
   Telephone encounter was:  Unsuccessful.  09/27/2021 Name: Mary Irwin MRN: [id number] DOB: Mar 20, 1951  Unsuccessful outbound call made today to assist with:  Food Insecurity  Outreach Attempt:  2nd Attempt  A HIPAA compliant voice message was left requesting a return call.  Instructed patient to call back at [phone] at their earliest convenience.  [place] management  [place], [place] [employer]  Main Phone: [phone]  E-mail: [email or web address]  Website: [email or web address]

## 2021-09-29 ENCOUNTER — Telehealth: Payer: Self-pay

## 2021-09-29 NOTE — Telephone Encounter (Signed)
   Telephone encounter was:  Unsuccessful.  09/29/2021 Name: Mary Irwin MRN: [id number] DOB: 1951/06/07  Unsuccessful outbound call made today to assist with:  Food Insecurity  Outreach Attempt:  3rd Attempt.  Referral closed unable to contact patient.  A HIPAA compliant voice message was left requesting a return call.  Instructed patient to call back at [phone] at their earliest convenience.  [place] management  [place], [place]  Main Phone: [phone]  E-mail: [email or web address]  Website: [email or web address]

## 2021-10-07 ENCOUNTER — Inpatient Hospital Stay: Payer: Medicare HMO | Attending: Internal Medicine

## 2021-10-07 ENCOUNTER — Other Ambulatory Visit: Payer: Self-pay

## 2021-10-07 DIAGNOSIS — E538 Deficiency of other specified B group vitamins: Secondary | ICD-10-CM | POA: Insufficient documentation

## 2021-10-07 DIAGNOSIS — Z79899 Other long term (current) drug therapy: Secondary | ICD-10-CM | POA: Insufficient documentation

## 2021-10-07 DIAGNOSIS — D509 Iron deficiency anemia, unspecified: Secondary | ICD-10-CM

## 2021-10-07 MED ORDER — CYANOCOBALAMIN 1000 MCG/ML IJ SOLN
1000.0000 ug | Freq: Once | INTRAMUSCULAR | Status: AC
  Administered 2021-10-07: 1000 ug via INTRAMUSCULAR
  Filled 2021-10-07: qty 1

## 2021-10-25 DIAGNOSIS — K5909 Other constipation: Secondary | ICD-10-CM | POA: Diagnosis not present

## 2021-10-25 DIAGNOSIS — R109 Unspecified abdominal pain: Secondary | ICD-10-CM | POA: Diagnosis not present

## 2021-10-25 DIAGNOSIS — K219 Gastro-esophageal reflux disease without esophagitis: Secondary | ICD-10-CM | POA: Diagnosis not present

## 2021-11-02 ENCOUNTER — Other Ambulatory Visit: Payer: Self-pay | Admitting: *Deleted

## 2021-11-02 DIAGNOSIS — D509 Iron deficiency anemia, unspecified: Secondary | ICD-10-CM

## 2021-11-07 ENCOUNTER — Encounter: Payer: Self-pay | Admitting: Nurse Practitioner

## 2021-11-09 ENCOUNTER — Inpatient Hospital Stay: Payer: Medicare (Managed Care) | Attending: Internal Medicine

## 2021-11-09 ENCOUNTER — Other Ambulatory Visit: Payer: Self-pay

## 2021-11-09 DIAGNOSIS — I10 Essential (primary) hypertension: Secondary | ICD-10-CM | POA: Insufficient documentation

## 2021-11-09 DIAGNOSIS — M797 Fibromyalgia: Secondary | ICD-10-CM | POA: Insufficient documentation

## 2021-11-09 DIAGNOSIS — Z7901 Long term (current) use of anticoagulants: Secondary | ICD-10-CM | POA: Insufficient documentation

## 2021-11-09 DIAGNOSIS — D509 Iron deficiency anemia, unspecified: Secondary | ICD-10-CM | POA: Insufficient documentation

## 2021-11-09 DIAGNOSIS — Z79899 Other long term (current) drug therapy: Secondary | ICD-10-CM | POA: Diagnosis not present

## 2021-11-09 DIAGNOSIS — Z803 Family history of malignant neoplasm of breast: Secondary | ICD-10-CM | POA: Insufficient documentation

## 2021-11-09 DIAGNOSIS — I4891 Unspecified atrial fibrillation: Secondary | ICD-10-CM | POA: Diagnosis not present

## 2021-11-09 DIAGNOSIS — R5383 Other fatigue: Secondary | ICD-10-CM | POA: Insufficient documentation

## 2021-11-09 DIAGNOSIS — E538 Deficiency of other specified B group vitamins: Secondary | ICD-10-CM | POA: Diagnosis not present

## 2021-11-09 DIAGNOSIS — E039 Hypothyroidism, unspecified: Secondary | ICD-10-CM | POA: Insufficient documentation

## 2021-11-09 DIAGNOSIS — D519 Vitamin B12 deficiency anemia, unspecified: Secondary | ICD-10-CM

## 2021-11-09 DIAGNOSIS — K219 Gastro-esophageal reflux disease without esophagitis: Secondary | ICD-10-CM | POA: Diagnosis not present

## 2021-11-09 LAB — CBC WITH DIFFERENTIAL/PLATELET
Abs Immature Granulocytes: 0.02 10*3/uL (ref 0.00–0.07)
Basophils Absolute: 0.1 10*3/uL (ref 0.0–0.1)
Basophils Relative: 1 %
Eosinophils Absolute: 0.6 10*3/uL — ABNORMAL HIGH (ref 0.0–0.5)
Eosinophils Relative: 8 %
HCT: 38 % (ref 36.0–46.0)
Hemoglobin: 11.4 g/dL — ABNORMAL LOW (ref 12.0–15.0)
Immature Granulocytes: 0 %
Lymphocytes Relative: 21 %
Lymphs Abs: 1.6 10*3/uL (ref 0.7–4.0)
MCH: 24.5 pg — ABNORMAL LOW (ref 26.0–34.0)
MCHC: 30 g/dL (ref 30.0–36.0)
MCV: 81.5 fL (ref 80.0–100.0)
Monocytes Absolute: 0.6 10*3/uL (ref 0.1–1.0)
Monocytes Relative: 8 %
Neutro Abs: 5 10*3/uL (ref 1.7–7.7)
Neutrophils Relative %: 62 %
Platelets: 344 10*3/uL (ref 150–400)
RBC: 4.66 MIL/uL (ref 3.87–5.11)
RDW: 14.8 % (ref 11.5–15.5)
WBC: 7.9 10*3/uL (ref 4.0–10.5)
nRBC: 0 % (ref 0.0–0.2)

## 2021-11-09 LAB — VITAMIN B12: Vitamin B-12: 239 pg/mL (ref 180–914)

## 2021-11-09 LAB — IRON AND TIBC
Iron: 37 ug/dL (ref 28–170)
Saturation Ratios: 7 % — ABNORMAL LOW (ref 10.4–31.8)
TIBC: 549 ug/dL — ABNORMAL HIGH (ref 250–450)
UIBC: 512 ug/dL

## 2021-11-09 LAB — FERRITIN: Ferritin: 13 ng/mL (ref 11–307)

## 2021-11-12 ENCOUNTER — Inpatient Hospital Stay (HOSPITAL_BASED_OUTPATIENT_CLINIC_OR_DEPARTMENT_OTHER): Payer: Medicare (Managed Care) | Admitting: Internal Medicine

## 2021-11-12 ENCOUNTER — Encounter: Payer: Self-pay | Admitting: Internal Medicine

## 2021-11-12 ENCOUNTER — Inpatient Hospital Stay: Payer: Medicare (Managed Care)

## 2021-11-12 ENCOUNTER — Other Ambulatory Visit: Payer: Self-pay

## 2021-11-12 VITALS — BP 166/63 | HR 91 | Temp 98.6°F | Ht 62.0 in | Wt 203.1 lb

## 2021-11-12 DIAGNOSIS — D509 Iron deficiency anemia, unspecified: Secondary | ICD-10-CM

## 2021-11-12 DIAGNOSIS — E538 Deficiency of other specified B group vitamins: Secondary | ICD-10-CM | POA: Diagnosis not present

## 2021-11-12 MED ORDER — SODIUM CHLORIDE 0.9 % IV SOLN
INTRAVENOUS | Status: DC
  Filled 2021-11-12: qty 250

## 2021-11-12 MED ORDER — SODIUM CHLORIDE 0.9 % IV SOLN: 200.0000 mg | Freq: Once | INTRAVENOUS | Status: DC

## 2021-11-12 MED ORDER — IRON SUCROSE 20 MG/ML IV SOLN
200.0000 mg | Freq: Once | INTRAVENOUS | Status: AC
  Administered 2021-11-12: 200 mg via INTRAVENOUS
  Filled 2021-11-12: qty 10

## 2021-11-12 MED ORDER — CYANOCOBALAMIN 1000 MCG/ML IJ SOLN
1000.0000 ug | Freq: Once | INTRAMUSCULAR | Status: AC
  Administered 2021-11-12: 1000 ug via INTRAMUSCULAR
  Filled 2021-11-12: qty 1

## 2021-11-12 NOTE — Assessment & Plan Note (Addendum)
#  Iron deficient anemia-unclear etiology.  S/p IV Venofer; improved not back to baseline.  Hemoglobin today is 11.8.  Proceed with Venofer.  #Etiology of iron deficient is unclear s/p EGD/colonoscopy October 2022; if worsening consider capsule study/CT imaging.   # B12 deficiency- b12 170. No known malabsorption issues. Prefers injections to oral supplementation. Will start monthly b12 1000 mcg every 4 weeks.    DISPOSITION: # Venofer today; B12 injection today # b12 injection once a  Month # in 3 months- NP labs-  (cbc, ferritin, iron studies; b12 )  possible venofer/b12 injection-Dr.B

## 2021-11-12 NOTE — Patient Instructions (Signed)

## 2021-11-12 NOTE — Progress Notes (Signed)
Pt is asking if she needs to have her B12 injections for this month?

## 2021-11-12 NOTE — Progress Notes (Signed)
Mary Irwin NOTE  Patient Care Team: Venita Lick, NP as PCP - General (Nurse Practitioner) Cammie Sickle, MD as Consulting Physician (Hematology and Oncology)  CHIEF COMPLAINTS/PURPOSE OF CONSULTATION: Anemia iron deficiency/B12 deficiency  #Anemia-iron/B12 deficiency [LA, NP-July 2022]-s/p EGD colonoscopy [OCT 2022; Dr.Toledo]; No capsule  HISTORY OF PRESENTING ILLNESS:  Mary Irwin 71 y.o.  female with a history of B12/iron deficiency of unclear etiology is here for follow-up.  Patient complains of fatigue.  Otherwise denies any blood in stools or black-colored stools.    Review of Systems  Constitutional:  Positive for malaise/fatigue. Negative for chills, diaphoresis, fever and weight loss.  HENT:  Negative for nosebleeds and sore throat.   Eyes:  Negative for double vision.  Respiratory:  Negative for cough, hemoptysis, sputum production, shortness of breath and wheezing.   Cardiovascular:  Negative for chest pain, palpitations, orthopnea and leg swelling.  Gastrointestinal:  Negative for abdominal pain, blood in stool, constipation, diarrhea, heartburn, melena, nausea and vomiting.  Genitourinary:  Negative for dysuria, frequency and urgency.  Musculoskeletal:  Positive for back pain and joint pain.  Skin: Negative.  Negative for itching and rash.  Neurological:  Negative for dizziness, tingling, focal weakness, weakness and headaches.  Endo/Heme/Allergies:  Does not bruise/bleed easily.  Psychiatric/Behavioral:  Negative for depression. The patient is not nervous/anxious and does not have insomnia.     MEDICAL HISTORY:  Past Medical History:  Diagnosis Date   Anemia    on iron supplements   Arthritis    all over   Asthma    once a week   Atrial fibrillation (HCC)    Chronic seasonal allergic rhinitis due to pollen    Diverticulosis    Dysrhythmia    Atrial Fibrillation   Fibromyalgia    GERD (gastroesophageal reflux disease)     HLD (hyperlipidemia)    Hypertension    Hypothyroidism    no meds   IBS (irritable bowel syndrome)    PONV (postoperative nausea and vomiting)    Psychophysiological insomnia    PVC's (premature ventricular contractions)    Sinus trouble     SURGICAL HISTORY: Past Surgical History:  Procedure Laterality Date   BACK SURGERY     CATARACT EXTRACTION W/PHACO Right 04/22/2019   Procedure: CATARACT EXTRACTION PHACO AND INTRAOCULAR LENS PLACEMENT ([place]) RIGHT;  Surgeon: Eulogio Bear, MD;  Location: [place];  Service: Ophthalmology;  Laterality: Right;   CATARACT EXTRACTION W/PHACO Left 05/27/2019   Procedure: CATARACT EXTRACTION PHACO AND INTRAOCULAR LENS PLACEMENT ([place])  LEFT;  Surgeon: Eulogio Bear, MD;  Location: [place];  Service: Ophthalmology;  Laterality: Left;   CHOLECYSTECTOMY N/A 09/22/2017   Procedure: LAPAROSCOPIC CHOLECYSTECTOMY WITH INTRAOPERATIVE CHOLANGIOGRAM;  Surgeon: Olean Ree, MD;  Location: [facility];  Service: General;  Laterality: N/A;   COLONOSCOPY WITH PROPOFOL N/A 05/23/2017   Procedure: COLONOSCOPY WITH PROPOFOL;  Surgeon: Lollie Sails, MD;  Location: [facility] END[facility]COPY;  Service: Endoscopy;  Laterality: N/A;   COLONOSCOPY WITH PROPOFOL N/A 08/18/2021   Procedure: COLONOSCOPY WITH PROPOFOL;  Surgeon: Toledo, [name], MD;  Location: [facility] ENDOSCOPY;  Service: Gastroenterology;  Laterality: N/A;   ESOPHAGOGASTRODUODENOSCOPY (EGD) WITH PROPOFOL N/A 05/23/2017   Procedure: ESOPHAGOGASTRODUODENOSCOPY (EGD) WITH PROPOFOL;  Surgeon: Lollie Sails, MD;  Location: [facility]OSCOPY;  Service: Endoscopy;  Laterality: N/A;   ESOPHAGOGASTRODUODENOSCOPY (EGD) WITH PROPOFOL N/A 07/31/2017   Procedure: ESOPHAGOGASTRODUODENOSCOPY (EGD) WITH PROPOFOL;  Surgeon: Lollie Sails, MD;  Location: [facility] ENDOSCOPY;  Service:  Endoscopy;  Laterality: N/A;   ESOPHAGOGASTRODUODENOSCOPY (EGD) WITH PROPOFOL N/A 08/18/2021   Procedure:  ESOPHAGOGASTRODUODENOSCOPY (EGD) WITH PROPOFOL;  Surgeon: Toledo, [name], MD;  Location: [facility] ENDOSCOPY;  Service: Gastroenterology;  Laterality: N/A;   EYE SURGERY     GIVENS CAPSULE STUDY     HERNIA REPAIR     umbilical   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   JOINT REPLACEMENT Bilateral    total hip replacement    SOCIAL HISTORY: Social History   Socioeconomic History   Marital status: Divorced    Spouse name: Not on file   Number of children: Not on file   Years of education: Not on file   Highest education level: Not on file  Occupational History   Not on file  Tobacco Use   Smoking status: Never   Smokeless tobacco: Never  Vaping Use   Vaping Use: Never used  Substance and Sexual Activity   Alcohol use: No   Drug use: No   Sexual activity: Not on file  Other Topics Concern   Not on file  Social History Narrative   Not on file   Social Determinants of Health   Financial Resource Strain: Medium Risk   Difficulty of Paying Living Expenses: Somewhat hard  Food Insecurity: No Food Insecurity   Worried About Running Out of Food in the Last Year: Never true   Ran Out of Food in the Last Year: Never true  Transportation Needs: No Transportation Needs   Lack of Transportation (Medical): No   Lack of Transportation (Non-Medical): No  Physical Activity: Inactive   Days of Exercise per Week: 0 days   Minutes of Exercise per Session: 0 min  Stress: No Stress Concern Present   Feeling of Stress : Not at all  Social Connections: Not on file  Intimate Partner Violence: Not on file    FAMILY HISTORY: Family History  Problem Relation Age of Onset   Breast cancer Maternal Aunt 38   Hypertension Mother     ALLERGIES:  is allergic to lisinopril and metoclopramide.  MEDICATIONS:  Current Outpatient Medications  Medication Sig Dispense Refill   albuterol (PROVENTIL HFA;VENTOLIN HFA) 108 (90 Base) MCG/ACT inhaler Inhale 2 puffs into the lungs every  6 (six) hours as needed for wheezing or shortness of breath.     amitriptyline (ELAVIL) 10 MG tablet TAKE 1 TABLET(10 MG) BY MOUTH EVERY NIGHT     apixaban (ELIQUIS) 5 MG TABS tablet Take 5 mg by mouth 2 (two) times daily. Am and bedtime     Cholecalciferol (VITAMIN D3) 1.25 MG (50000 UT) CAPS TAKE ONE CAPSULE BY MOUTH ONCE A WEEK. 12 capsule 4   diltiazem (CARDIZEM CD) 300 MG 24 hr capsule Take 300 mg by mouth daily.     hydrochlorothiazide (HYDRODIURIL) 25 MG tablet Take 25 mg by mouth daily. am     ondansetron (ZOFRAN-ODT) 4 MG disintegrating tablet Take 4 mg by mouth every 8 (eight) hours as needed for nausea or vomiting.     pantoprazole (PROTONIX) 40 MG tablet Take 40 mg by mouth 2 (two) times daily. Am and bedtime     rosuvastatin (CRESTOR) 10 MG tablet Take 1 tablet (10 mg total) by mouth daily. 90 tablet 4   amitriptyline (ELAVIL) 10 MG tablet Take 10 mg by mouth at bedtime.     dicyclomine (BENTYL) 10 MG capsule Take 1 capsule (10 mg total) by mouth 3 (three) times daily before meals. (Patient not taking:  Reported on 09/10/2021) 90 capsule 4   fluticasone furoate-vilanterol (BREO ELLIPTA) 200-25 MCG/INH AEPB Inhale 1 puff into the lungs daily. am (Patient not taking: Reported on 11/12/2021)     gabapentin (NEURONTIN) 100 MG capsule Take 100 mg by mouth at bedtime. (Patient not taking: Reported on 09/10/2021)     metoprolol succinate (TOPROL-XL) 25 MG 24 hr tablet Take 25 mg by mouth daily. (Patient not taking: Reported on 11/12/2021)     No current facility-administered medications for this visit.   Facility-Administered Medications Ordered in Other Visits  Medication Dose Route Frequency Provider Last Rate Last Admin   0.9 %  sodium chloride infusion   Intravenous Continuous Cammie Sickle, MD   Stopped at 11/12/21 1421      .  PHYSICAL EXAMINATION:  Vitals:   11/12/21 1306  BP: (!) 166/63  Pulse: 91  Temp: 98.6 F (37 C)  SpO2: 99%   Filed Weights   11/12/21 1306   Weight: 203 lb 1.6 oz (92.1 kg)    Physical Exam Vitals and nursing note reviewed.  HENT:     Head: Normocephalic and atraumatic.     Mouth/Throat:     Pharynx: Oropharynx is clear.  Eyes:     Extraocular Movements: Extraocular movements intact.     Pupils: Pupils are equal, round, and reactive to light.  Cardiovascular:     Rate and Rhythm: Normal rate and regular rhythm.  Pulmonary:     Comments: Decreased breath sounds bilaterally.  Abdominal:     Palpations: Abdomen is soft.  Musculoskeletal:        General: Normal range of motion.     Cervical back: Normal range of motion.  Skin:    General: Skin is warm.  Neurological:     General: No focal deficit present.     Mental Status: She is alert and oriented to person, place, and time.  Psychiatric:        Behavior: Behavior normal.        Judgment: Judgment normal.     LABORATORY DATA:  I have reviewed the data as listed Lab Results  Component Value Date   WBC 7.9 11/09/2021   HGB 11.4 (L) 11/09/2021   HCT 38.0 11/09/2021   MCV 81.5 11/09/2021   PLT 344 11/09/2021   Recent Labs    04/16/21 1304 04/16/21 1314 04/23/21 1216  NA 141 143 138  K 3.7 3.8 4.0  CL 110 105 103  CO2 23 16* 24  GLUCOSE 99 104* 103*  BUN 12 11 12   CREATININE 0.80 0.73 0.73  CALCIUM 9.1 9.5 9.4  GFRNONAA >60  --  >60  PROT 7.1 6.9 7.4  ALBUMIN 3.8 4.5 4.0  AST 25 22 38  ALT 17 16 25   ALKPHOS 78 115 95  BILITOT 0.6 <0.2 0.4    RADIOGRAPHIC STUDIES: I have personally reviewed the radiological images as listed and agreed with the findings in the report. No results found.  B12 deficiency #Iron deficient anemia-unclear etiology.  S/p IV Venofer; improved not back to baseline.  Hemoglobin today is 11.8.  Proceed with Venofer.  #Etiology of iron deficient is unclear s/p EGD/colonoscopy October 2022; if worsening consider capsule study/CT imaging.    # B12 deficiency- b12 170. No known malabsorption issues. Prefers injections to  oral supplementation. Will start monthly b12 1000 mcg every 4 weeks.     DISPOSITION: # Venofer today; B12 injection today # b12 injection once a  Month # in 3 months-  NP labs-  (cbc, ferritin, iron studies; b12 )  possible venofer/b12 injection-Dr.B  All questions were answered. The patient knows to call the clinic with any problems, questions or concerns.    Cammie Sickle, MD 11/12/2021 4:04 PM

## 2021-11-16 ENCOUNTER — Encounter: Payer: Self-pay | Admitting: Nurse Practitioner

## 2021-12-01 ENCOUNTER — Ambulatory Visit: Payer: BC Managed Care – PPO | Admitting: Nurse Practitioner

## 2021-12-01 DIAGNOSIS — E538 Deficiency of other specified B group vitamins: Secondary | ICD-10-CM

## 2021-12-01 DIAGNOSIS — Z1159 Encounter for screening for other viral diseases: Secondary | ICD-10-CM

## 2021-12-01 DIAGNOSIS — E079 Disorder of thyroid, unspecified: Secondary | ICD-10-CM

## 2021-12-01 DIAGNOSIS — D6869 Other thrombophilia: Secondary | ICD-10-CM

## 2021-12-01 DIAGNOSIS — D509 Iron deficiency anemia, unspecified: Secondary | ICD-10-CM

## 2021-12-01 DIAGNOSIS — K219 Gastro-esophageal reflux disease without esophagitis: Secondary | ICD-10-CM

## 2021-12-01 DIAGNOSIS — I10 Essential (primary) hypertension: Secondary | ICD-10-CM

## 2021-12-01 DIAGNOSIS — I700 Atherosclerosis of aorta: Secondary | ICD-10-CM

## 2021-12-01 DIAGNOSIS — J4520 Mild intermittent asthma, uncomplicated: Secondary | ICD-10-CM

## 2021-12-01 DIAGNOSIS — E7800 Pure hypercholesterolemia, unspecified: Secondary | ICD-10-CM

## 2021-12-01 DIAGNOSIS — I480 Paroxysmal atrial fibrillation: Secondary | ICD-10-CM

## 2021-12-01 DIAGNOSIS — E559 Vitamin D deficiency, unspecified: Secondary | ICD-10-CM

## 2021-12-10 ENCOUNTER — Inpatient Hospital Stay: Payer: Medicare (Managed Care) | Attending: Internal Medicine

## 2021-12-10 ENCOUNTER — Other Ambulatory Visit: Payer: Self-pay

## 2021-12-10 DIAGNOSIS — Z79899 Other long term (current) drug therapy: Secondary | ICD-10-CM | POA: Diagnosis not present

## 2021-12-10 DIAGNOSIS — E538 Deficiency of other specified B group vitamins: Secondary | ICD-10-CM | POA: Diagnosis not present

## 2021-12-10 DIAGNOSIS — D509 Iron deficiency anemia, unspecified: Secondary | ICD-10-CM

## 2021-12-10 MED ORDER — CYANOCOBALAMIN 1000 MCG/ML IJ SOLN
1000.0000 ug | Freq: Once | INTRAMUSCULAR | Status: AC
  Administered 2021-12-10: 1000 ug via INTRAMUSCULAR
  Filled 2021-12-10: qty 1

## 2021-12-13 ENCOUNTER — Encounter: Payer: Self-pay | Admitting: Nurse Practitioner

## 2021-12-19 NOTE — Patient Instructions (Signed)

## 2021-12-21 ENCOUNTER — Encounter: Payer: Self-pay | Admitting: Nurse Practitioner

## 2021-12-21 ENCOUNTER — Other Ambulatory Visit: Payer: Self-pay

## 2021-12-21 ENCOUNTER — Ambulatory Visit (INDEPENDENT_AMBULATORY_CARE_PROVIDER_SITE_OTHER): Payer: Medicare (Managed Care) | Admitting: Nurse Practitioner

## 2021-12-21 VITALS — BP 130/73 | HR 88 | Temp 99.4°F | Ht 62.0 in | Wt 201.2 lb

## 2021-12-21 DIAGNOSIS — I480 Paroxysmal atrial fibrillation: Secondary | ICD-10-CM | POA: Diagnosis not present

## 2021-12-21 DIAGNOSIS — Z23 Encounter for immunization: Secondary | ICD-10-CM

## 2021-12-21 DIAGNOSIS — I10 Essential (primary) hypertension: Secondary | ICD-10-CM

## 2021-12-21 DIAGNOSIS — E7800 Pure hypercholesterolemia, unspecified: Secondary | ICD-10-CM

## 2021-12-21 DIAGNOSIS — D508 Other iron deficiency anemias: Secondary | ICD-10-CM

## 2021-12-21 DIAGNOSIS — Z1159 Encounter for screening for other viral diseases: Secondary | ICD-10-CM

## 2021-12-21 DIAGNOSIS — I700 Atherosclerosis of aorta: Secondary | ICD-10-CM

## 2021-12-21 DIAGNOSIS — E559 Vitamin D deficiency, unspecified: Secondary | ICD-10-CM

## 2021-12-21 DIAGNOSIS — K219 Gastro-esophageal reflux disease without esophagitis: Secondary | ICD-10-CM

## 2021-12-21 DIAGNOSIS — I340 Nonrheumatic mitral (valve) insufficiency: Secondary | ICD-10-CM

## 2021-12-21 DIAGNOSIS — M797 Fibromyalgia: Secondary | ICD-10-CM

## 2021-12-21 DIAGNOSIS — D6869 Other thrombophilia: Secondary | ICD-10-CM

## 2021-12-21 DIAGNOSIS — E079 Disorder of thyroid, unspecified: Secondary | ICD-10-CM

## 2021-12-21 DIAGNOSIS — E538 Deficiency of other specified B group vitamins: Secondary | ICD-10-CM

## 2021-12-21 DIAGNOSIS — J4520 Mild intermittent asthma, uncomplicated: Secondary | ICD-10-CM

## 2021-12-21 MED ORDER — SHINGRIX 50 MCG/0.5ML IM SUSR: 0.5000 mL | Freq: Once | INTRAMUSCULAR | 0 refills | Status: AC

## 2021-12-21 MED ORDER — FLUTICASONE FUROATE-VILANTEROL 200-25 MCG/ACT IN AEPB: 1.0000 | INHALATION_SPRAY | Freq: Every day | RESPIRATORY_TRACT | 4 refills | Status: DC

## 2021-12-21 NOTE — Assessment & Plan Note (Signed)
Noted on echo and murmur noted on exam, continue to collaborate with cardiology.

## 2021-12-21 NOTE — Assessment & Plan Note (Signed)
Chronic, ongoing.  Currently on Amitriptyline by GI for IBS and Fibro.  Continue this medication regimen and adjust as needed.  Discussed with her possible trial of Duloxetine in future for symptoms relief, which she is interested in -- may benefit fibromyalgia symptoms.  Return in 6 months.

## 2021-12-21 NOTE — Assessment & Plan Note (Signed)
Ongoing, followed by hematology and receiving current IM injections monthly. 

## 2021-12-21 NOTE — Progress Notes (Addendum)
BP 130/73    Pulse 88    Temp 99.4 F (37.4 C) (Oral)    Ht 5\' 2"  (1.575 m)    Wt 201 lb 3.2 oz (91.3 kg)    SpO2 95%    BMI 36.80 kg/m    Subjective:    Patient ID: Mary Irwin, female    DOB: 1951/07/20, 71 y.o.   MRN: [id number]  HPI: Mary Irwin is a 71 y.o. female  Chief Complaint  Patient presents with   Hyperlipidemia   Hypertension   Gastroesophageal Reflux   Asthma   Thyroid Disease   GI Problem    Patient states she is having issues with her stomach. Patient states she is having issues with Constipation for a few days and this happens about every 4-6 weeks. Patient has tried over the counter Miralax and Doculax. Patient states it just is not getting any better. Patient is scheduled with GI appointment tomorrow. Patient states she is still having a lot of problems.    HYPERTENSION / HYPERLIPIDEMIA + A-FIB Followed by cardiology, last saw on 04/07/21.  Taking Eliquis, Cardizem, HCTZ, and Metoprolol + Rosuvastatin 10 MG.   Aortic atherosclerosis noted on imaging 01/03/2020.  Currently not on statin.   Satisfied with current treatment? yes Duration of hypertension: chronic BP monitoring frequency: rarely BP range: 120-130/70-80 range BP medication side effects: no Past BP meds: unsure Aspirin: no Recent stressors: no Recurrent headaches: no Visual changes: no Palpitations: none Dyspnea: yes Chest pain: no Lower extremity edema: occasional Dizzy/lightheaded: none The 10-year ASCVD risk score (Arnett DK, et al., 2019) is: 13.1%   Values used to calculate the score:     Age: 18 years     Sex: Female     Is Non-Hispanic African American: No     Diabetic: No     Tobacco smoker: No     Systolic Blood Pressure: 737 mmHg     Is BP treated: Yes     HDL Cholesterol: 48 mg/dL     Total Cholesterol: 190 mg/dL    ANEMIA Followed by hematology -- last saw them 11/12/21, continues with iron infusions, recent HGB 11.4.  Continues to get B12 injections with  clinic.  History of thyroid labs elevations -- recent were stable. Anemia status: controlled Etiology of anemia: iron deficiency Severity of anemia: moderate Fatigue: at times Decreased exercise tolerance: none Dyspnea on exertion: none Palpitations: none Bleeding: no Pica: no    GERD Taking Protonix 40 MG BID + Amitriptyline.  Last saw GI 10/25/21 - [facility], Dr. Chauncey Mann.  Followed by them for IBS (with constipation) and fibromyalgia + GERD.  Is scheduled to see GI tomorrow for follow-up as is having issues constipation every 4-6 weeks, has tried OTC medications without benefit.  Does take Metamucil daily.     She has chronic pain with with Fibromyalgia -- she does not recall what she has taken.  Recently went to physical therapy for back pain.  GERD control status: controlled Satisfied with current treatment? yes Heartburn frequency: none Medication side effects: no  Medication compliance: stable Previous GERD medications: none Antacid use frequency:  occasional Duration: none Nature: none Dysphagia: no Odynophagia:  no Hematemesis: no Blood in stool: no EGD: 07/31/2017   ASTHMA Having more symptoms, ran out of Breo 3 months ago and notices symptoms worsened after this.  Using Proair often at this time.  Last saw pulmonary, Dr. Raul Del, 06/29/17. Asthma status: uncontrolled Satisfied with current treatment?: yes Albuterol/rescue inhaler frequency:  2 times a week Dyspnea frequency: with activity Wheezing frequency: daily Cough frequency: daily Nocturnal symptom frequency: yes Limitation of activity: no Current upper respiratory symptoms: no Triggers: unknown Home peak flows: none Last Spirometry: 2018 Failed/intolerant to following asthma meds: none Asthma meds in past: Breo Aerochamber/spacer use: no Visits to ER or Urgent Care in past year: no Pneumovax: Up to Date Influenza: Up to Date   Depression screen [facility] 2/9 12/21/2021 09/10/2021 04/23/2021 04/16/2021  Decreased  Interest 0 0 0 0  Down, Depressed, Hopeless 0 0 0 0  PHQ - 2 Score 0 0 0 0  Altered sleeping 1 - - 0  Tired, decreased energy 1 - - 0  Change in appetite 0 - - 0  Feeling bad or failure about yourself  0 - - 0  Trouble concentrating 0 - - 0  Moving slowly or fidgety/restless 0 - - 0  Suicidal thoughts 0 - - 0  PHQ-9 Score 2 - - 0  Difficult doing work/chores Not difficult at all - - -     Relevant past medical, surgical, family and social history reviewed and updated as indicated. Interim medical history since our last visit reviewed. Allergies and medications reviewed and updated.  Review of Systems  Constitutional:  Negative for activity change, appetite change, diaphoresis, fatigue and fever.  Respiratory:  Positive for cough, shortness of breath (occasional) and wheezing. Negative for chest tightness.   Cardiovascular:  Negative for chest pain, palpitations and leg swelling.  Gastrointestinal:  Positive for constipation and nausea (occasional). Negative for abdominal distention, abdominal pain, blood in stool, diarrhea and vomiting.  Neurological: Negative.   Psychiatric/Behavioral: Negative.     Per HPI unless specifically indicated above     Objective:    BP 130/73    Pulse 88    Temp 99.4 F (37.4 C) (Oral)    Ht 5\' 2"  (1.575 m)    Wt 201 lb 3.2 oz (91.3 kg)    SpO2 95%    BMI 36.80 kg/m   Wt Readings from Last 3 Encounters:  12/21/21 201 lb 3.2 oz (91.3 kg)  11/12/21 203 lb 1.6 oz (92.1 kg)  09/10/21 200 lb (90.7 kg)    Physical Exam Vitals and nursing note reviewed.  Constitutional:      General: She is awake. She is not in acute distress.    Appearance: She is well-developed and well-groomed. She is obese. She is not ill-appearing or toxic-appearing.  HENT:     Head: Normocephalic and atraumatic.     Right Ear: Hearing normal.     Left Ear: Hearing normal.     Nose: Nose normal.  Eyes:     General: Lids are normal.        Right eye: No discharge.         Left eye: No discharge.     Conjunctiva/sclera: Conjunctivae normal.     Pupils: Pupils are equal, round, and reactive to light.  Neck:     Thyroid: No thyromegaly.     Vascular: No carotid bruit.  Cardiovascular:     Rate and Rhythm: Normal rate and regular rhythm.     Heart sounds: Murmur heard.  Systolic murmur is present with a grade of 3/6.    No gallop.  Pulmonary:     Effort: Pulmonary effort is normal. No accessory muscle usage or respiratory distress.     Breath sounds: Wheezing present.     Comments: Scattered wheezes intermittent throughout with dry cough. Abdominal:  General: Bowel sounds are normal. There is no distension.     Palpations: Abdomen is soft. There is no hepatomegaly.     Tenderness: There is no abdominal tenderness.  Musculoskeletal:     Cervical back: Normal range of motion and neck supple.     Right lower leg: Edema (trace) present.     Left lower leg: Edema (trace) present.  Lymphadenopathy:     Cervical: No cervical adenopathy.  Skin:    General: Skin is warm and dry.  Neurological:     Mental Status: She is alert and oriented to person, place, and time.     Deep Tendon Reflexes: Reflexes are normal and symmetric.     Reflex Scores:      Brachioradialis reflexes are 2+ on the right side and 2+ on the left side.      Patellar reflexes are 2+ on the right side and 2+ on the left side. Psychiatric:        Attention and Perception: Attention normal.        Mood and Affect: Mood normal.        Speech: Speech normal.        Behavior: Behavior normal. Behavior is cooperative.        Thought Content: Thought content normal.    Results for orders placed or performed in visit on 11/09/21  Iron and TIBC([facility])  Result Value Ref Range   Iron 37 28 - 170 ug/dL   TIBC 549 (H) 250 - 450 ug/dL   Saturation Ratios 7 (L) 10.4 - 31.8 %   UIBC 512 ug/dL  Vitamin B12  Result Value Ref Range   Vitamin B-12 239 180 - 914 pg/mL  Ferritin   Result Value Ref Range   Ferritin 13 11 - 307 ng/mL  CBC with Differential  Result Value Ref Range   WBC 7.9 4.0 - 10.5 K/uL   RBC 4.66 3.87 - 5.11 MIL/uL   Hemoglobin 11.4 (L) 12.0 - 15.0 g/dL   HCT 38.0 36.0 - 46.0 %   MCV 81.5 80.0 - 100.0 fL   MCH 24.5 (L) 26.0 - 34.0 pg   MCHC 30.0 30.0 - 36.0 g/dL   RDW 14.8 11.5 - 15.5 %   Platelets 344 150 - 400 K/uL   nRBC 0.0 0.0 - 0.2 %   Neutrophils Relative % 62 %   Neutro Abs 5.0 1.7 - 7.7 K/uL   Lymphocytes Relative 21 %   Lymphs Abs 1.6 0.7 - 4.0 K/uL   Monocytes Relative 8 %   Monocytes Absolute 0.6 0.1 - 1.0 K/uL   Eosinophils Relative 8 %   Eosinophils Absolute 0.6 (H) 0.0 - 0.5 K/uL   Basophils Relative 1 %   Basophils Absolute 0.1 0.0 - 0.1 K/uL   Immature Granulocytes 0 %   Abs Immature Granulocytes 0.02 0.00 - 0.07 K/uL      Assessment & Plan:   Problem List Items Addressed This Visit       Cardiovascular and Mediastinum   Aortic atherosclerosis ([place])    Noted on CT 01/03/2020 -- discussed with patient and educated on this.  Continue Rosuvastatin daily and ASA.      Relevant Orders   Comprehensive metabolic panel   Essential hypertension    Chronic, ongoing with BP at goal today and on rare check at home.  Recommend she monitor BP at least a few mornings a week at home and document.  DASH diet at home.  Continue current medication  regimen and adjust as needed + collaboration with cardiology.  Labs today: Lipid, TSH, CBC, CMP.  Return in 6 months.      Relevant Orders   Comprehensive metabolic panel   Mitral regurgitation    Noted on echo and murmur noted on exam, continue to collaborate with cardiology.      Paroxysmal atrial fibrillation (HCC) - Primary    Chronic, ongoing, rate controlled.  Followed by cardiology.  Continue this collaboration and current medication regimen as prescribed by them, including Eliquis.        Relevant Orders   Microalbumin, Urine Waived     Respiratory   Asthma without  status asthmaticus    Chronic, ongoing, previously followed by pulmonary -- last seen 2018. Referral to have her return for collaboration.  At this time refills on [facility] sent, as symptoms worse without it on board, and continue rescue inhaler as needed only.  Return in 6 months.      Relevant Medications   fluticasone furoate-vilanterol (BREO ELLIPTA) 200-25 MCG/ACT AEPB   Other Relevant Orders   Ambulatory referral to Pulmonology     Digestive   GERD without esophagitis    Chronic, ongoing.  Well-controlled with Protonix at higher dose, continue collaboration with GI.  She returns to see GI tomorrow and will discuss constipation with them.      Relevant Orders   Magnesium     Endocrine   Thyroid disease    Recent TSH within normal range, no current medications.  Check TSH and Free T4 today.      Relevant Orders   T4, free   Thyroid peroxidase antibody   TSH     Hematopoietic and Hemostatic   Other thrombophilia ([place])    Secondary to Eliquis and A-Fib.  Continue current medication regimen and adjust as needed.  Monitor CBC.        Other   B12 deficiency    Ongoing, followed by hematology and receiving current IM injections monthly.      Fibromyalgia    Chronic, ongoing.  Currently on Amitriptyline by GI for IBS and Fibro.  Continue this medication regimen and adjust as needed.  Discussed with her possible trial of Duloxetine in future for symptoms relief, which she is interested in -- may benefit fibromyalgia symptoms.  Return in 6 months.      Hyperlipemia    Chronic, ongoing.   Continue Rosuvastatin and adjust dosing as needed, started last visit.  Recheck lipid panel and CMP today.      Relevant Orders   Comprehensive metabolic panel   Lipid Panel w/o Chol/HDL Ratio   Iron deficiency anemia    Chronic, stable, followed by hematology.  Patient had a colonoscopy and endoscopy, that showed that she had diverticulosis and polyps, some polyps were removed. Patient  taking iron and gets B12 infusion monthly, continue these and collaboration with hematology.  Recent notes reviewed.      Vitamin D deficiency    Chronic, stable.  Check level today and continue supplement weekly.      Relevant Orders   VITAMIN D 25 Hydroxy (Vit-D Deficiency, Fractures)   Other Visit Diagnoses     Need for shingles vaccine       Shingrix ordered.   Need for hepatitis C screening test       Hep C check per guidelines on labs today, discussed with patient.   Relevant Orders   Hepatitis C antibody        Follow up  plan: Return in about 6 months (around 06/20/2022) for HTN/HLD, Fibromyalgia, MOOD, GERD, ASTHMA.

## 2021-12-21 NOTE — Assessment & Plan Note (Signed)
Chronic, stable.  Check level today and continue supplement weekly.

## 2021-12-21 NOTE — Assessment & Plan Note (Signed)
Chronic, ongoing.  Well-controlled with Protonix at higher dose, continue collaboration with GI.  She returns to see GI tomorrow and will discuss constipation with them.

## 2021-12-21 NOTE — Assessment & Plan Note (Signed)
Recent TSH within normal range, no current medications.  Check TSH and Free T4 today.

## 2021-12-21 NOTE — Assessment & Plan Note (Signed)
Noted on CT 01/03/2020 -- discussed with patient and educated on this.  Continue Rosuvastatin daily and ASA.

## 2021-12-21 NOTE — Assessment & Plan Note (Signed)
Chronic, ongoing with BP at goal today and on rare check at home.  Recommend she monitor BP at least a few mornings a week at home and document.  DASH diet at home.  Continue current medication regimen and adjust as needed + collaboration with cardiology.  Labs today: Lipid, TSH, CBC, CMP.  Return in 6 months.

## 2021-12-21 NOTE — Assessment & Plan Note (Signed)
Chronic, ongoing, previously followed by pulmonary -- last seen 2018. Referral to have her return for collaboration.  At this time refills on [facility] sent, as symptoms worse without it on board, and continue rescue inhaler as needed only.  Return in 6 months.

## 2021-12-21 NOTE — Assessment & Plan Note (Signed)
Secondary to Eliquis and A-Fib.  Continue current medication regimen and adjust as needed.  Monitor CBC.

## 2021-12-21 NOTE — Assessment & Plan Note (Signed)
Chronic, ongoing, rate controlled.  Followed by cardiology.  Continue this collaboration and current medication regimen as prescribed by them, including Eliquis.

## 2021-12-21 NOTE — Assessment & Plan Note (Signed)
Chronic, ongoing.   Continue Rosuvastatin and adjust dosing as needed, started last visit.  Recheck lipid panel and CMP today. 

## 2021-12-21 NOTE — Assessment & Plan Note (Signed)
Chronic, stable, followed by hematology.  Patient had a colonoscopy and endoscopy, that showed that she had diverticulosis and polyps, some polyps were removed. Patient taking iron and gets B12 infusion monthly, continue these and collaboration with hematology.  Recent notes reviewed.

## 2022-01-07 ENCOUNTER — Other Ambulatory Visit: Payer: Self-pay

## 2022-01-07 ENCOUNTER — Inpatient Hospital Stay: Payer: Medicare (Managed Care) | Attending: Internal Medicine

## 2022-01-07 DIAGNOSIS — E538 Deficiency of other specified B group vitamins: Secondary | ICD-10-CM | POA: Diagnosis not present

## 2022-01-07 DIAGNOSIS — Z79899 Other long term (current) drug therapy: Secondary | ICD-10-CM | POA: Insufficient documentation

## 2022-01-07 DIAGNOSIS — D509 Iron deficiency anemia, unspecified: Secondary | ICD-10-CM

## 2022-01-07 MED ORDER — CYANOCOBALAMIN 1000 MCG/ML IJ SOLN
1000.0000 ug | Freq: Once | INTRAMUSCULAR | Status: AC
  Administered 2022-01-07: 1000 ug via INTRAMUSCULAR
  Filled 2022-01-07: qty 1

## 2022-02-01 ENCOUNTER — Other Ambulatory Visit: Payer: Self-pay | Admitting: *Deleted

## 2022-02-01 DIAGNOSIS — D509 Iron deficiency anemia, unspecified: Secondary | ICD-10-CM

## 2022-02-11 ENCOUNTER — Inpatient Hospital Stay: Payer: Medicare (Managed Care) | Attending: Internal Medicine

## 2022-02-11 DIAGNOSIS — E538 Deficiency of other specified B group vitamins: Secondary | ICD-10-CM | POA: Insufficient documentation

## 2022-02-11 DIAGNOSIS — D509 Iron deficiency anemia, unspecified: Secondary | ICD-10-CM | POA: Insufficient documentation

## 2022-02-11 LAB — CBC WITH DIFFERENTIAL/PLATELET
Abs Immature Granulocytes: 0.02 10*3/uL (ref 0.00–0.07)
Basophils Absolute: 0.1 10*3/uL (ref 0.0–0.1)
Basophils Relative: 1 %
Eosinophils Absolute: 0.3 10*3/uL (ref 0.0–0.5)
Eosinophils Relative: 5 %
HCT: 40.3 % (ref 36.0–46.0)
Hemoglobin: 11.9 g/dL — ABNORMAL LOW (ref 12.0–15.0)
Immature Granulocytes: 0 %
Lymphocytes Relative: 21 %
Lymphs Abs: 1.5 10*3/uL (ref 0.7–4.0)
MCH: 23.6 pg — ABNORMAL LOW (ref 26.0–34.0)
MCHC: 29.5 g/dL — ABNORMAL LOW (ref 30.0–36.0)
MCV: 80 fL (ref 80.0–100.0)
Monocytes Absolute: 0.5 10*3/uL (ref 0.1–1.0)
Monocytes Relative: 7 %
Neutro Abs: 4.6 10*3/uL (ref 1.7–7.7)
Neutrophils Relative %: 66 %
Platelets: 302 10*3/uL (ref 150–400)
RBC: 5.04 MIL/uL (ref 3.87–5.11)
RDW: 15.7 % — ABNORMAL HIGH (ref 11.5–15.5)
WBC: 6.9 10*3/uL (ref 4.0–10.5)
nRBC: 0 % (ref 0.0–0.2)

## 2022-02-11 LAB — IRON AND TIBC
Iron: 35 ug/dL (ref 28–170)
Saturation Ratios: 7 % — ABNORMAL LOW (ref 10.4–31.8)
TIBC: 532 ug/dL — ABNORMAL HIGH (ref 250–450)
UIBC: 497 ug/dL

## 2022-02-11 LAB — FERRITIN: Ferritin: 14 ng/mL (ref 11–307)

## 2022-02-11 LAB — VITAMIN B12: Vitamin B-12: 323 pg/mL (ref 180–914)

## 2022-02-14 ENCOUNTER — Inpatient Hospital Stay (HOSPITAL_BASED_OUTPATIENT_CLINIC_OR_DEPARTMENT_OTHER): Payer: Medicare (Managed Care) | Admitting: Internal Medicine

## 2022-02-14 ENCOUNTER — Inpatient Hospital Stay: Payer: Medicare (Managed Care)

## 2022-02-14 ENCOUNTER — Encounter: Payer: Self-pay | Admitting: Internal Medicine

## 2022-02-14 VITALS — BP 156/85 | HR 88 | Temp 99.1°F | Resp 18 | Wt 199.2 lb

## 2022-02-14 VITALS — BP 141/77 | HR 78

## 2022-02-14 DIAGNOSIS — E538 Deficiency of other specified B group vitamins: Secondary | ICD-10-CM

## 2022-02-14 DIAGNOSIS — D509 Iron deficiency anemia, unspecified: Secondary | ICD-10-CM

## 2022-02-14 LAB — URINALYSIS, COMPLETE (UACMP) WITH MICROSCOPIC
Bilirubin Urine: NEGATIVE
Glucose, UA: NEGATIVE mg/dL
Ketones, ur: NEGATIVE mg/dL
Nitrite: NEGATIVE
Protein, ur: NEGATIVE mg/dL
Specific Gravity, Urine: 1.018 (ref 1.005–1.030)
pH: 6 (ref 5.0–8.0)

## 2022-02-14 MED ORDER — SODIUM CHLORIDE 0.9 % IV SOLN
Freq: Once | INTRAVENOUS | Status: AC
  Filled 2022-02-14: qty 250

## 2022-02-14 MED ORDER — IRON SUCROSE 20 MG/ML IV SOLN
200.0000 mg | Freq: Once | INTRAVENOUS | Status: AC
  Administered 2022-02-14: 200 mg via INTRAVENOUS
  Filled 2022-02-14: qty 10

## 2022-02-14 MED ORDER — SODIUM CHLORIDE 0.9 % IV SOLN: 200.0000 mg | Freq: Once | INTRAVENOUS | Status: DC

## 2022-02-14 MED ORDER — CYANOCOBALAMIN 1000 MCG/ML IJ SOLN
1000.0000 ug | Freq: Once | INTRAMUSCULAR | Status: AC
  Administered 2022-02-14: 1000 ug via INTRAMUSCULAR
  Filled 2022-02-14: qty 1

## 2022-02-14 NOTE — Progress Notes (Signed)
Patient denies new problems/concerns today.   °

## 2022-02-14 NOTE — Progress Notes (Signed)
[place] ?CONSULT NOTE ? ?Patient Care Team: ?Venita Lick, NP as PCP - General (Nurse Practitioner) ?Cammie Sickle, MD as Consulting Physician (Hematology and Oncology) ? ?CHIEF COMPLAINTS/PURPOSE OF CONSULTATION: Anemia iron deficiency/B12 deficiency ? ?#Anemia-iron/B12 deficiency [LA, NP-July 2022]-s/p EGD colonoscopy [OCT 2022; Dr.Toledo]; hx of capsule [in last 5 years] [JAN 2021] ? ?# A.fib on eliquis.  ? ?HISTORY OF PRESENTING ILLNESS: Alone; ambulating independently.  ? ?Mary Irwin 71 y.o.  female with a history of B12/iron deficiency of unclear etiology is here for follow-up. ? ?Patient complains of fatigue.  Otherwise denies any blood in stools or black-colored stools.  ? ? ?Review of Systems  ?Constitutional:  Positive for malaise/fatigue. Negative for chills, diaphoresis, fever and weight loss.  ?HENT:  Negative for nosebleeds and sore throat.   ?Eyes:  Negative for double vision.  ?Respiratory:  Negative for cough, hemoptysis, sputum production, shortness of breath and wheezing.   ?Cardiovascular:  Negative for chest pain, palpitations, orthopnea and leg swelling.  ?Gastrointestinal:  Negative for abdominal pain, blood in stool, constipation, diarrhea, heartburn, melena, nausea and vomiting.  ?Genitourinary:  Negative for dysuria, frequency and urgency.  ?Musculoskeletal:  Positive for back pain and joint pain.  ?Skin: Negative.  Negative for itching and rash.  ?Neurological:  Negative for dizziness, tingling, focal weakness, weakness and headaches.  ?Endo/Heme/Allergies:  Does not bruise/bleed easily.  ?Psychiatric/Behavioral:  Negative for depression. The patient is not nervous/anxious and does not have insomnia.    ? ?MEDICAL HISTORY:  ?Past Medical History:  ?Diagnosis Date  ? Anemia   ? on iron supplements  ? Arthritis   ? all over  ? Asthma   ? once a week  ? Atrial fibrillation ([place])   ? Chronic seasonal allergic rhinitis due to pollen   ? Diverticulosis   ?  Dysrhythmia   ? Atrial Fibrillation  ? Fibromyalgia   ? GERD (gastroesophageal reflux disease)   ? HLD (hyperlipidemia)   ? Hypertension   ? Hypothyroidism   ? no meds  ? IBS (irritable bowel syndrome)   ? PONV (postoperative nausea and vomiting)   ? Psychophysiological insomnia   ? PVC's (premature ventricular contractions)   ? Sinus trouble   ? ? ?SURGICAL HISTORY: ?Past Surgical History:  ?Procedure Laterality Date  ? BACK SURGERY    ? CATARACT EXTRACTION W/PHACO Right 04/22/2019  ? Procedure: CATARACT EXTRACTION PHACO AND INTRAOCULAR LENS PLACEMENT ([place]) RIGHT;  Surgeon: Eulogio Bear, MD;  Location: [place];  Service: Ophthalmology;  Laterality: Right;  ? CATARACT EXTRACTION W/PHACO Left 05/27/2019  ? Procedure: CATARACT EXTRACTION PHACO AND INTRAOCULAR LENS PLACEMENT ([place])  LEFT;  Surgeon: Eulogio Bear, MD;  Location: [place];  Service: Ophthalmology;  Laterality: Left;  ? CHOLECYSTECTOMY N/A 09/22/2017  ? Procedure: LAPAROSCOPIC CHOLECYSTECTOMY WITH INTRAOPERATIVE CHOLANGIOGRAM;  Surgeon: Olean Ree, MD;  Location: [facility];  Service: General;  Laterality: N/A;  ? COLONOSCOPY WITH PROPOFOL N/A 05/23/2017  ? Procedure: COLONOSCOPY WITH PROPOFOL;  Surgeon: Lollie Sails, MD;  [facility] ENDOSCOPY;  Service: Endoscopy;  Laterality: N/A;  ? COLONOSCOPY WITH PROPOFOL N/A 08/18/2021  ? Procedure: COLONOSCOPY WITH PROPOFOL;  Surgeon: Toledo, [name], MD;  Location: [facility] ENDOSCOPY;  Service: Gastroenterology;  Laterality: N/A;  ? ESOPHAGOGASTRODUODENOSCOPY (EGD) WITH PROPOFOL N/A 05/23/2017  ? Procedure: ESOPHAGOGASTRODUODENOSCOPY (EGD) WITH PROPOFOL;  Surgeon: Lollie Sails, MD;  Location[facility] ENDOSCOPY;  Service: Endoscopy;  Laterality: N/A;  ? ESOPHAGOGASTRODUODENOSCOPY (EGD) WITH PROPOFOL N/A 07/31/2017  ?  Procedure: ESOPHAGOGASTRODUODENOSCOPY (EGD) WITH PROPOFOL;  Surgeon: Lollie Sails, MD;  Location: [facility] ENDOSCOPY;  Service: Endoscopy;  Laterality:  N/A;  ? ESOPHAGOGASTRODUODENOSCOPY (EGD) WITH PROPOFOL N/A 08/18/2021  ? Procedure: ESOPHAGOGASTRODUODENOSCOPY (EGD) WITH PROPOFOL;  Surgeon: Toledo, [name], MD;  Location: [facility] ENDOSCOPY;  Service: Gastroenterology;  Laterality: N/A;  ? EYE SURGERY    ? GIVENS CAPSULE STUDY    ? HERNIA REPAIR    ? umbilical  ? JOINT REPLACEMENT Left 2008  ? Total Knee Replacement  ? JOINT REPLACEMENT Bilateral   ? total hip replacement  ? ? ?SOCIAL HISTORY: ?Social History  ? ?Socioeconomic History  ? Marital status: Divorced  ?  Spouse name: Not on file  ? Number of children: Not on file  ? Years of education: Not on file  ? Highest education level: Not on file  ?Occupational History  ? Not on file  ?Tobacco Use  ? Smoking status: Never  ? Smokeless tobacco: Never  ?Vaping Use  ? Vaping Use: Never used  ?Substance and Sexual Activity  ? Alcohol use: No  ? Drug use: No  ? Sexual activity: Not on file  ?Other Topics Concern  ? Not on file  ?Social History Narrative  ? Not on file  ? ?Social Determinants of Health  ? ?Financial Resource Strain: Medium Risk  ? Difficulty of Paying Living Expenses: Somewhat hard  ?Food Insecurity: No Food Insecurity  ? Worried About Charity fundraiser in the Last Year: Never true  ? Ran Out of Food in the Last Year: Never true  ?Transportation Needs: No Transportation Needs  ? Lack of Transportation (Medical): No  ? Lack of Transportation (Non-Medical): No  ?Physical Activity: Inactive  ? Days of Exercise per Week: 0 days  ? Minutes of Exercise per Session: 0 min  ?Stress: No Stress Concern Present  ? Feeling of Stress : Not at all  ?Social Connections: Not on file  ?Intimate Partner Violence: Not on file  ? ? ?FAMILY HISTORY: ?Family History  ?Problem Relation Age of Onset  ? Breast cancer Maternal Aunt 41  ? Hypertension Mother   ? ? ?ALLERGIES:  is allergic to lisinopril and metoclopramide. ? ?MEDICATIONS:  ?Current Outpatient Medications  ?Medication Sig Dispense Refill  ? albuterol (PROVENTIL  HFA;VENTOLIN HFA) 108 (90 Base) MCG/ACT inhaler Inhale 2 puffs into the lungs every 6 (six) hours as needed for wheezing or shortness of breath.    ? amitriptyline (ELAVIL) 10 MG tablet Take 10 mg by mouth at bedtime.    ? apixaban (ELIQUIS) 5 MG TABS tablet Take 5 mg by mouth 2 (two) times daily. Am and bedtime    ? Cholecalciferol (VITAMIN D3) 1.25 MG (50000 UT) CAPS TAKE ONE CAPSULE BY MOUTH ONCE A WEEK. 12 capsule 4  ? fluticasone furoate-vilanterol (BREO ELLIPTA) 200-25 MCG/ACT AEPB Inhale 1 puff into the lungs daily. 60 each 4  ? hydrochlorothiazide (HYDRODIURIL) 25 MG tablet Take 25 mg by mouth daily. am    ? Hyoscyamine Sulfate SL 0.125 MG SUBL Place 0.125 mg under the tongue every 6 (six) hours as needed. Place 1 tablet (0.125 mg total) under the tongue every 6 (six) hours as needed for Cramping    ? LINZESS 72 MCG capsule Take 72 mcg by mouth every morning.    ? ondansetron (ZOFRAN-ODT) 4 MG disintegrating tablet Take 4 mg by mouth every 8 (eight) hours as needed for nausea or vomiting.    ? rosuvastatin (CRESTOR) 10 MG tablet Take 1  tablet (10 mg total) by mouth daily. 90 tablet 4  ? diltiazem (CARDIZEM CD) 300 MG 24 hr capsule Take 300 mg by mouth daily.    ? pantoprazole (PROTONIX) 40 MG tablet Take 40 mg by mouth 2 (two) times daily. Am and bedtime (Patient not taking: Reported on 02/14/2022)    ? [START ON 02/15/2022] Zoster Vaccine Adjuvanted [facility]) injection Inject 0.5 mLs into the muscle once for 1 dose. Dose # 2 (Patient not taking: Reported on 02/14/2022) 0.5 mL 0  ? ?No current facility-administered medications for this visit.  ? ? ?  ?. ? ?PHYSICAL EXAMINATION: ? ?Vitals:  ? 02/14/22 1300  ?BP: (!) 156/85  ?Pulse: 88  ?Resp: 18  ?Temp: 99.1 ?F (37.3 ?C)  ? ?Filed Weights  ? 02/14/22 1300  ?Weight: 199 lb 3.2 oz (90.4 kg)  ? ? ?Physical Exam ?Vitals and nursing note reviewed.  ?HENT:  ?   Head: Normocephalic and atraumatic.  ?   Mouth/Throat:  ?   Pharynx: Oropharynx is clear.  ?Eyes:  ?    Extraocular Movements: Extraocular movements intact.  ?   Pupils: Pupils are equal, round, and reactive to light.  ?Cardiovascular:  ?   Rate and Rhythm: Normal rate and regular rhythm.  ?Pulmonary:  ?   Comme

## 2022-02-14 NOTE — Assessment & Plan Note (Addendum)
#   Iron deficient anemia-unclear etiology.  S/p IV Venofer; improved not back to baseline.  Hemoglobin today is 11.9.  Proceed with Venofer. ? ?#Etiology of iron deficient is unclear s/p EGD/colonoscopy [[facility];Ms.Woodard; stool occult+] October 2022; if worsening consider capsule study [previously in last 5 years]/CT imaging- FEB 2021-NEG.  ?? ?# B12 deficiency- b12 170 [2023. jan]. continue monthly b12 1000 mcg every 4 weeks.  ? ?# A.fib- on eliquis [Dr.Parashoes] ? ??DISPOSITION: ?# check UA ; no culture ?# Venofer today; B12 injection today ?# b12 injection once a  Month x2 ?# in 3 months- MD;  labs-  (cbc, ferritin, iron studies; b12 )  possible venofer/b12 injection-Dr.B ?

## 2022-02-23 ENCOUNTER — Telehealth: Payer: Self-pay | Admitting: Nurse Practitioner

## 2022-02-23 NOTE — Telephone Encounter (Signed)
Patient brought in a disability parking placard to be complete be provider. Form was placed in the provider folder for completion. Please call patient when it can be picked up. ?

## 2022-02-23 NOTE — Telephone Encounter (Signed)
Noted will sign upon return to office. ?

## 2022-02-24 NOTE — Telephone Encounter (Signed)
Patient notified and says she will try and get by the office this afternoon before we close. ?

## 2022-03-16 ENCOUNTER — Inpatient Hospital Stay: Payer: Medicare (Managed Care) | Attending: Internal Medicine

## 2022-03-16 DIAGNOSIS — D509 Iron deficiency anemia, unspecified: Secondary | ICD-10-CM

## 2022-03-16 DIAGNOSIS — E538 Deficiency of other specified B group vitamins: Secondary | ICD-10-CM | POA: Insufficient documentation

## 2022-03-16 DIAGNOSIS — Z79899 Other long term (current) drug therapy: Secondary | ICD-10-CM | POA: Diagnosis not present

## 2022-03-16 MED ORDER — CYANOCOBALAMIN 1000 MCG/ML IJ SOLN
1000.0000 ug | Freq: Once | INTRAMUSCULAR | Status: AC
  Administered 2022-03-16: 1000 ug via INTRAMUSCULAR
  Filled 2022-03-16: qty 1

## 2022-04-15 ENCOUNTER — Inpatient Hospital Stay: Payer: Medicare (Managed Care) | Attending: Internal Medicine

## 2022-04-15 DIAGNOSIS — E538 Deficiency of other specified B group vitamins: Secondary | ICD-10-CM | POA: Diagnosis not present

## 2022-04-15 DIAGNOSIS — D509 Iron deficiency anemia, unspecified: Secondary | ICD-10-CM

## 2022-04-15 MED ORDER — CYANOCOBALAMIN 1000 MCG/ML IJ SOLN
1000.0000 ug | Freq: Once | INTRAMUSCULAR | Status: AC
  Administered 2022-04-15: 1000 ug via INTRAMUSCULAR
  Filled 2022-04-15: qty 1

## 2022-04-29 ENCOUNTER — Other Ambulatory Visit: Payer: Self-pay | Admitting: Nurse Practitioner

## 2022-05-17 ENCOUNTER — Inpatient Hospital Stay: Payer: Medicare (Managed Care)

## 2022-05-17 ENCOUNTER — Encounter: Payer: Self-pay | Admitting: Internal Medicine

## 2022-05-17 ENCOUNTER — Inpatient Hospital Stay (HOSPITAL_BASED_OUTPATIENT_CLINIC_OR_DEPARTMENT_OTHER): Payer: Medicare (Managed Care) | Admitting: Internal Medicine

## 2022-05-17 ENCOUNTER — Inpatient Hospital Stay: Payer: Medicare (Managed Care) | Attending: Internal Medicine

## 2022-05-17 VITALS — BP 138/66 | HR 75 | Resp 16

## 2022-05-17 VITALS — BP 150/69 | HR 84 | Temp 99.6°F | Resp 16 | Wt 197.0 lb

## 2022-05-17 DIAGNOSIS — E538 Deficiency of other specified B group vitamins: Secondary | ICD-10-CM | POA: Diagnosis not present

## 2022-05-17 DIAGNOSIS — D509 Iron deficiency anemia, unspecified: Secondary | ICD-10-CM | POA: Insufficient documentation

## 2022-05-17 DIAGNOSIS — I4891 Unspecified atrial fibrillation: Secondary | ICD-10-CM | POA: Insufficient documentation

## 2022-05-17 DIAGNOSIS — Z7901 Long term (current) use of anticoagulants: Secondary | ICD-10-CM | POA: Diagnosis not present

## 2022-05-17 LAB — CBC WITH DIFFERENTIAL/PLATELET
Abs Immature Granulocytes: 0.02 10*3/uL (ref 0.00–0.07)
Basophils Absolute: 0.1 10*3/uL (ref 0.0–0.1)
Basophils Relative: 1 %
Eosinophils Absolute: 0.4 10*3/uL (ref 0.0–0.5)
Eosinophils Relative: 5 %
HCT: 36.3 % (ref 36.0–46.0)
Hemoglobin: 11.2 g/dL — ABNORMAL LOW (ref 12.0–15.0)
Immature Granulocytes: 0 %
Lymphocytes Relative: 19 %
Lymphs Abs: 1.5 10*3/uL (ref 0.7–4.0)
MCH: 24.8 pg — ABNORMAL LOW (ref 26.0–34.0)
MCHC: 30.9 g/dL (ref 30.0–36.0)
MCV: 80.5 fL (ref 80.0–100.0)
Monocytes Absolute: 0.5 10*3/uL (ref 0.1–1.0)
Monocytes Relative: 7 %
Neutro Abs: 5.4 10*3/uL (ref 1.7–7.7)
Neutrophils Relative %: 68 %
Platelets: 348 10*3/uL (ref 150–400)
RBC: 4.51 MIL/uL (ref 3.87–5.11)
RDW: 15.6 % — ABNORMAL HIGH (ref 11.5–15.5)
WBC: 7.8 10*3/uL (ref 4.0–10.5)
nRBC: 0 % (ref 0.0–0.2)

## 2022-05-17 LAB — VITAMIN B12: Vitamin B-12: 339 pg/mL (ref 180–914)

## 2022-05-17 LAB — IRON AND TIBC
Iron: 33 ug/dL (ref 28–170)
Saturation Ratios: 6 % — ABNORMAL LOW (ref 10.4–31.8)
TIBC: 561 ug/dL — ABNORMAL HIGH (ref 250–450)
UIBC: 528 ug/dL

## 2022-05-17 LAB — FERRITIN: Ferritin: 14 ng/mL (ref 11–307)

## 2022-05-17 MED ORDER — SODIUM CHLORIDE 0.9 % IV SOLN
Freq: Once | INTRAVENOUS | Status: AC
  Filled 2022-05-17: qty 250

## 2022-05-17 MED ORDER — CYANOCOBALAMIN 1000 MCG/ML IJ SOLN
1000.0000 ug | Freq: Once | INTRAMUSCULAR | Status: AC
  Administered 2022-05-17: 1000 ug via INTRAMUSCULAR
  Filled 2022-05-17: qty 1

## 2022-05-17 MED ORDER — SODIUM CHLORIDE 0.9 % IV SOLN
200.0000 mg | Freq: Once | INTRAVENOUS | Status: AC
  Administered 2022-05-17: 200 mg via INTRAVENOUS
  Filled 2022-05-17: qty 10

## 2022-05-17 MED FILL — Iron Sucrose Inj 20 MG/ML (Fe Equiv): INTRAVENOUS | Qty: 10 | Status: AC

## 2022-05-17 NOTE — Progress Notes (Signed)
Patient has an increase in fatigue.

## 2022-05-17 NOTE — Assessment & Plan Note (Addendum)
#   Iron deficient anemia-unclear etiology.  S/p IV Venofer; improved not back to baseline.  Hemoglobin today is 11.9.  July 2023 ferritin 14 saturation 6%.  Proceed with Venofer-monthly along with B12 injections..  #Etiology of iron deficient is unclear s/p EGD/colonoscopy [[facility];Ms.Woodard; stool occult+] October 2022; if worsening consider capsule study [previously in last 5 years]/CT imaging- FEB 2021-NEG.   # B12 deficiency- b12 170 [2023. jan]. continue monthly b12 1000 mcg every 4 weeks.   # A.fib- on eliquis [Dr.Parashoes]  DISPOSITION: # Venofer today; B12 injection today # 1 month- b12 injection; venofer # 2 month- b12 injection; venofer # 3 month- b12 injection; venofer # in 4 months- MD;  labs-  [cbc, ferritin, iron studies; b12 ]  possible venofer/b12 injection-Dr.B

## 2022-05-17 NOTE — Progress Notes (Signed)
[place] NOTE  Patient Care Team: Venita Lick, NP as PCP - General (Nurse Practitioner) Cammie Sickle, MD as Consulting Physician (Hematology and Oncology)  CHIEF COMPLAINTS/PURPOSE OF CONSULTATION: Anemia iron deficiency/B12 deficiency  #Anemia-iron/B12 deficiency [LA, NP-July 2022]-s/p EGD colonoscopy [OCT 2022; Dr.Toledo]; hx of capsule [in last 5 years] [JAN 2021]  # A.fib on eliquis.   HISTORY OF PRESENTING ILLNESS: Alone; ambulating independently.   Mary Irwin 71 y.o.  female with a history of B12/iron deficiency of unclear etiology is here for follow-up.  Patient complains of fatigue.  Otherwise denies any blood in stools or black-colored stools.    Review of Systems  Constitutional:  Positive for malaise/fatigue. Negative for chills, diaphoresis, fever and weight loss.  HENT:  Negative for nosebleeds and sore throat.   Eyes:  Negative for double vision.  Respiratory:  Negative for cough, hemoptysis, sputum production, shortness of breath and wheezing.   Cardiovascular:  Negative for chest pain, palpitations, orthopnea and leg swelling.  Gastrointestinal:  Negative for abdominal pain, blood in stool, constipation, diarrhea, heartburn, melena, nausea and vomiting.  Genitourinary:  Negative for dysuria, frequency and urgency.  Musculoskeletal:  Positive for back pain and joint pain.  Skin: Negative.  Negative for itching and rash.  Neurological:  Negative for dizziness, tingling, focal weakness, weakness and headaches.  Endo/Heme/Allergies:  Does not bruise/bleed easily.  Psychiatric/Behavioral:  Negative for depression. The patient is not nervous/anxious and does not have insomnia.      MEDICAL HISTORY:  Past Medical History:  Diagnosis Date   Anemia    on iron supplements   Arthritis    all over   Asthma    once a week   Atrial fibrillation ([facility])    Chronic seasonal allergic rhinitis due to pollen    Diverticulosis     Dysrhythmia    Atrial Fibrillation   Fibromyalgia    GERD (gastroesophageal reflux disease)    HLD (hyperlipidemia)    Hypertension    Hypothyroidism    no meds   IBS (irritable bowel syndrome)    PONV (postoperative nausea and vomiting)    Psychophysiological insomnia    PVC's (premature ventricular contractions)    Sinus trouble     SURGICAL HISTORY: Past Surgical History:  Procedure Laterality Date   BACK SURGERY     CATARACT EXTRACTION W/PHACO Right 04/22/2019   Procedure: CATARACT EXTRACTION PHACO AND INTRAOCULAR LENS PLACEMENT ([place]) RIGHT;  Surgeon: Eulogio Bear, MD;  Location: [place];  Service: Ophthalmology;  Laterality: Right;   CATARACT EXTRACTION W/PHACO Left 05/27/2019   Procedure: CATARACT EXTRACTION PHACO AND INTRAOCULAR LENS PLACEMENT ([place])  LEFT;  Surgeon: Eulogio Bear, MD;  Location: [place];  Service: Ophthalmology;  Laterality: Left;   CHOLECYSTECTOMY N/A 09/22/2017   Procedure: LAPAROSCOPIC CHOLECYSTECTOMY WITH INTRAOPERATIVE CHOLANGIOGRAM;  Surgeon: Olean Ree, MD;  Location: [facility];  Service: General;  Laterality: N/A;   COLONOSCOPY WITH PROPOFOL N/A 05/23/2017   Procedure: COLONOSCOPY WITH PROPOFOL;  Surgeon: Lollie Sails, MD;  Location: [facility] ENDOSCOPY;  Service: Endoscopy;  Laterality: N/A;   COLONOSCOPY WITH PROPOFOL N/A 08/18/2021   Procedure: COLONOSCOPY WITH PROPOFOL;  Surgeon: Toledo, [name], MD;  Location: [facility] ENDOSCOPY;  Service: Gastroenterology;  Laterality: N/A;   ESOPHAGOGASTRODUODENOSCOPY (EGD) WITH PROPOFOL N/A 05/23/2017   Procedure: ESOPHAGOGASTRODUODENOSCOPY (EGD) WITH PROPOFOL;  Surgeon: Lollie Sails, MD;  Location: [facility] ENDOSCOPY;  Service: Endoscopy;  Laterality: N/A;   ESOPHAGOGASTRODUODENOSCOPY (EGD) WITH PROPOFOL N/A 07/31/2017  Procedure: ESOPHAGOGASTRODUODENOSCOPY (EGD) WITH PROPOFOL;  Surgeon: Lollie Sails, MD;  Location: [facility] ENDOSCOPY;  Service: Endoscopy;  Laterality:  N/A;   ESOPHAGOGASTRODUODENOSCOPY (EGD) WITH PROPOFOL N/A 08/18/2021   Procedure: ESOPHAGOGASTRODUODENOSCOPY (EGD) WITH PROPOFOL;  Surgeon: Toledo, [name], MD;  Location: [facility] ENDOSCOPY;  Service: Gastroenterology;  Laterality: N/A;   EYE SURGERY     GIVENS CAPSULE STUDY     HERNIA REPAIR     umbilical   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   JOINT REPLACEMENT Bilateral    total hip replacement    SOCIAL HISTORY: Social History   Socioeconomic History   Marital status: Divorced    Spouse name: Not on file   Number of children: Not on file   Years of education: Not on file   Highest education level: Not on file  Occupational History   Not on file  Tobacco Use   Smoking status: Never   Smokeless tobacco: Never  Vaping Use   Vaping Use: Never used  Substance and Sexual Activity   Alcohol use: No   Drug use: No   Sexual activity: Not on file  Other Topics Concern   Not on file  Social History Narrative   Not on file   Social Determinants of Health   Financial Resource Strain: Medium Risk (09/10/2021)   Overall Financial Resource Strain (CARDIA)    Difficulty of Paying Living Expenses: Somewhat hard  Food Insecurity: No Food Insecurity (09/10/2021)   Hunger Vital Sign    Worried About Running Out of Food in the Last Year: Never true    Ran Out of Food in the Last Year: Never true  Transportation Needs: No Transportation Needs (09/10/2021)   PRAPARE - Hydrologist (Medical): No    Lack of Transportation (Non-Medical): No  Physical Activity: Inactive (09/10/2021)   Exercise Vital Sign    Days of Exercise per Week: 0 days    Minutes of Exercise per Session: 0 min  Stress: No Stress Concern Present (09/10/2021)   [place]    Feeling of Stress : Not at all  Social Connections: Not on file  Intimate Partner Violence: Not on file    FAMILY HISTORY: Family  History  Problem Relation Age of Onset   Breast cancer Maternal Aunt 38   Hypertension Mother     ALLERGIES:  is allergic to lisinopril and metoclopramide.  MEDICATIONS:  Current Outpatient Medications  Medication Sig Dispense Refill   albuterol (PROVENTIL HFA;VENTOLIN HFA) 108 (90 Base) MCG/ACT inhaler Inhale 2 puffs into the lungs every 6 (six) hours as needed for wheezing or shortness of breath.     amitriptyline (ELAVIL) 10 MG tablet Take 10 mg by mouth at bedtime.     apixaban (ELIQUIS) 5 MG TABS tablet Take 5 mg by mouth 2 (two) times daily. Am and bedtime     Cholecalciferol (VITAMIN D3) 1.25 MG (50000 UT) CAPS TAKE ONE CAPSULE BY MOUTH ONCE A WEEK. 12 capsule 4   diltiazem (CARDIZEM CD) 300 MG 24 hr capsule Take 300 mg by mouth daily.     fluticasone furoate-vilanterol (BREO ELLIPTA) 200-25 MCG/ACT AEPB Inhale 1 puff into the lungs daily. 60 each 4   hydrochlorothiazide (HYDRODIURIL) 25 MG tablet Take 25 mg by mouth daily. am     Hyoscyamine Sulfate SL 0.125 MG SUBL Place 0.125 mg under the tongue every 6 (six) hours as needed. Place 1 tablet (0.125  mg total) under the tongue every 6 (six) hours as needed for Cramping     LINZESS 72 MCG capsule Take 72 mcg by mouth every morning.     ondansetron (ZOFRAN-ODT) 4 MG disintegrating tablet Take 4 mg by mouth every 8 (eight) hours as needed for nausea or vomiting.     rosuvastatin (CRESTOR) 10 MG tablet TAKE 1 TABLET(10 MG) BY MOUTH DAILY 90 tablet 4   pantoprazole (PROTONIX) 40 MG tablet Take 40 mg by mouth 2 (two) times daily. Am and bedtime (Patient not taking: Reported on 02/14/2022)     No current facility-administered medications for this visit.      [name]  PHYSICAL EXAMINATION:  Vitals:   05/17/22 1300  BP: (!) 150/69  Pulse: 84  Resp: 16  Temp: 99.6 F (37.6 C)  SpO2: 99%   Filed Weights   05/17/22 1300  Weight: 197 lb (89.4 kg)    Physical Exam Vitals and nursing note reviewed.  HENT:     Head: Normocephalic and  atraumatic.     Mouth/Throat:     Pharynx: Oropharynx is clear.  Eyes:     Extraocular Movements: Extraocular movements intact.     Pupils: Pupils are equal, round, and reactive to light.  Cardiovascular:     Rate and Rhythm: Normal rate and regular rhythm.  Pulmonary:     Comments: Decreased breath sounds bilaterally.  Abdominal:     Palpations: Abdomen is soft.  Musculoskeletal:        General: Normal range of motion.     Cervical back: Normal range of motion.  Skin:    General: Skin is warm.  Neurological:     General: No focal deficit present.     Mental Status: She is alert and oriented to person, place, and time.  Psychiatric:        Behavior: Behavior normal.        Judgment: Judgment normal.      LABORATORY DATA:  I have reviewed the data as listed Lab Results  Component Value Date   WBC 7.8 05/17/2022   HGB 11.2 (L) 05/17/2022   HCT 36.3 05/17/2022   MCV 80.5 05/17/2022   PLT 348 05/17/2022   No results for input(s): "NA", "K", "CL", "CO2", "GLUCOSE", "BUN", "CREATININE", "CALCIUM", "GFRNONAA", "GFRAA", "PROT", "ALBUMIN", "AST", "ALT", "ALKPHOS", "BILITOT", "BILIDIR", "IBILI" in the last 8760 hours.   RADIOGRAPHIC STUDIES: I have personally reviewed the radiological images as listed and agreed with the findings in the report. No results found.  B12 deficiency # Iron deficient anemia-unclear etiology.  S/p IV Venofer; improved not back to baseline.  Hemoglobin today is 11.9.  July 2023 ferritin 14 saturation 6%.  Proceed with Venofer-monthly along with B12 injections..  #Etiology of iron deficient is unclear s/p EGD/colonoscopy [[facility];Ms.Woodard; stool occult+] October 2022; if worsening consider capsule study [previously in last 5 years]/CT imaging- FEB 2021-NEG.    # B12 deficiency- b12 170 [2023. jan]. continue monthly b12 1000 mcg every 4 weeks.   # A.fib- on eliquis [Dr.Parashoes]   DISPOSITION: # Venofer today; B12 injection today # 1 month- b12  injection; venofer # 2 month- b12 injection; venofer # 3 month- b12 injection; venofer # in 4 months- MD;  labs-  [cbc, ferritin, iron studies; b12 ]  possible venofer/b12 injection-Dr.B  All questions were answered. The patient knows to call the clinic with any problems, questions or concerns.    Cammie Sickle, MD 05/17/2022 2:17 PM

## 2022-05-19 ENCOUNTER — Encounter: Payer: Self-pay | Admitting: Nurse Practitioner

## 2022-05-21 ENCOUNTER — Other Ambulatory Visit: Payer: Self-pay | Admitting: Nurse Practitioner

## 2022-05-24 NOTE — Telephone Encounter (Signed)
Requested medication (s) are due for refill today: yes  Requested medication (s) are on the active medication list: yes  Last refill:  12/21/21 #60 with 4 RF  Future visit scheduled: 06/20/22, seen 12/21/21  Notes to clinic:  There is not a protocol attached to this request for some reason, please assess.      Requested Prescriptions  Pending Prescriptions Disp Refills   BREO ELLIPTA 200-25 MCG/ACT AEPB [Pharmacy Med Name: BREO ELLIPTA 200-25MCG ORAL INH(30)] 60 each 4    Sig: INHALE 1 PUFF INTO THE LUNGS DAILY     There is no refill protocol information for this order

## 2022-06-17 ENCOUNTER — Inpatient Hospital Stay: Payer: Medicare (Managed Care) | Attending: Internal Medicine

## 2022-06-17 VITALS — BP 124/63 | HR 71 | Resp 16

## 2022-06-17 DIAGNOSIS — Z79899 Other long term (current) drug therapy: Secondary | ICD-10-CM | POA: Diagnosis not present

## 2022-06-17 DIAGNOSIS — D509 Iron deficiency anemia, unspecified: Secondary | ICD-10-CM | POA: Diagnosis present

## 2022-06-17 DIAGNOSIS — E538 Deficiency of other specified B group vitamins: Secondary | ICD-10-CM | POA: Insufficient documentation

## 2022-06-17 MED ORDER — CYANOCOBALAMIN 1000 MCG/ML IJ SOLN
1000.0000 ug | Freq: Once | INTRAMUSCULAR | Status: AC
  Administered 2022-06-17: 1000 ug via INTRAMUSCULAR

## 2022-06-17 MED ORDER — SODIUM CHLORIDE 0.9 % IV SOLN
Freq: Once | INTRAVENOUS | Status: AC
  Filled 2022-06-17: qty 250

## 2022-06-17 MED ORDER — SODIUM CHLORIDE 0.9 % IV SOLN
200.0000 mg | Freq: Once | INTRAVENOUS | Status: AC
  Administered 2022-06-17: 200 mg via INTRAVENOUS
  Filled 2022-06-17: qty 200

## 2022-06-18 NOTE — Patient Instructions (Incomplete)
Start taking Vitamin D 2000 units daily.  Hypothyroidism  Hypothyroidism is when the thyroid gland does not make enough of certain hormones. This is called an underactive thyroid. The thyroid gland is a small gland located in the lower front part of the neck, just in front of the windpipe (trachea). This gland makes hormones that help control how the body uses food for energy (metabolism) as well as how the heart and brain function. These hormones also play a role in keeping your bones strong. When the thyroid is underactive, it produces too little of the hormones thyroxine (T4) and triiodothyronine (T3). What are the causes? This condition may be caused by: Hashimoto's disease. This is a disease in which the body's disease-fighting system (immune system) attacks the thyroid gland. This is the most common cause. Viral infections. Pregnancy. Certain medicines. Birth defects. Problems with a gland in the center of the brain (pituitary gland). Lack of enough iodine in the diet. Other causes may include: Past radiation treatments to the head or neck for cancer. Past treatment with radioactive iodine. Past exposure to radiation in the environment. Past surgical removal of part or all of the thyroid. What increases the risk? You are more likely to develop this condition if: You are female. You have a family history of thyroid conditions. You use a medicine called lithium. You take medicines that affect the immune system (immunosuppressants). What are the signs or symptoms? Common symptoms of this condition include: Not being able to tolerate cold. Feeling as though you have no energy (lethargy). Lack of appetite. Constipation. Sadness or depression. Weight gain that is not explained by a change in diet or exercise habits. Menstrual irregularity. Dry skin, coarse hair, or brittle nails. Other symptoms may include: Muscle pain. Slowing of thought processes. Poor memory. How is this  diagnosed? This condition may be diagnosed based on: Your symptoms, your medical history, and a physical exam. Blood tests. You may also have imaging tests, such as an ultrasound or MRI. How is this treated? This condition is treated with medicine that replaces the thyroid hormones that your body does not make. After you begin treatment, it may take several weeks for symptoms to go away. Follow these instructions at home: Take over-the-counter and prescription medicines only as told by your health care provider. If you start taking any new medicines, tell your health care provider. Keep all follow-up visits as told by your health care provider. This is important. As your condition improves, your dosage of thyroid hormone medicine may change. You will need to have blood tests regularly so that your health care provider can monitor your condition. Contact a health care provider if: Your symptoms do not get better with treatment. You are taking thyroid hormone replacement medicine and you: Sweat a lot. Have tremors. Feel anxious. Lose weight rapidly. Cannot tolerate heat. Have emotional swings. Have diarrhea. Feel weak. Get help right away if: You have chest pain. You have an irregular heartbeat. You have a rapid heartbeat. You have difficulty breathing. These symptoms may be an emergency. Get help right away. Call 911. Do not wait to see if the symptoms will go away. Do not drive yourself to the hospital. Summary Hypothyroidism is when the thyroid gland does not make enough of certain hormones (it is underactive). When the thyroid is underactive, it produces too little of the hormones thyroxine (T4) and triiodothyronine (T3). The most common cause is Hashimoto's disease, a disease in which the body's disease-fighting system (immune system) attacks the  thyroid gland. The condition can also be caused by viral infections, medicine, pregnancy, or past radiation treatment to the head or  neck. Symptoms may include weight gain, dry skin, constipation, feeling as though you do not have energy, and not being able to tolerate cold. This condition is treated with medicine to replace the thyroid hormones that your body does not make. This information is not intended to replace advice given to you by your health care provider. Make sure you discuss any questions you have with your health care provider. Document Revised: 10/26/2021 Document Reviewed: 10/26/2021 Elsevier Patient Education  2023 [employer].

## 2022-06-20 ENCOUNTER — Encounter: Payer: Self-pay | Admitting: Nurse Practitioner

## 2022-06-20 ENCOUNTER — Ambulatory Visit (INDEPENDENT_AMBULATORY_CARE_PROVIDER_SITE_OTHER): Payer: Medicare (Managed Care) | Admitting: Nurse Practitioner

## 2022-06-20 VITALS — BP 136/82 | HR 107 | Temp 98.2°F | Wt 191.1 lb

## 2022-06-20 DIAGNOSIS — E079 Disorder of thyroid, unspecified: Secondary | ICD-10-CM

## 2022-06-20 DIAGNOSIS — M797 Fibromyalgia: Secondary | ICD-10-CM

## 2022-06-20 DIAGNOSIS — Z1159 Encounter for screening for other viral diseases: Secondary | ICD-10-CM

## 2022-06-20 DIAGNOSIS — J4520 Mild intermittent asthma, uncomplicated: Secondary | ICD-10-CM

## 2022-06-20 DIAGNOSIS — I480 Paroxysmal atrial fibrillation: Secondary | ICD-10-CM

## 2022-06-20 DIAGNOSIS — I700 Atherosclerosis of aorta: Secondary | ICD-10-CM

## 2022-06-20 DIAGNOSIS — D508 Other iron deficiency anemias: Secondary | ICD-10-CM

## 2022-06-20 DIAGNOSIS — D6869 Other thrombophilia: Secondary | ICD-10-CM | POA: Diagnosis not present

## 2022-06-20 DIAGNOSIS — E559 Vitamin D deficiency, unspecified: Secondary | ICD-10-CM

## 2022-06-20 DIAGNOSIS — K219 Gastro-esophageal reflux disease without esophagitis: Secondary | ICD-10-CM

## 2022-06-20 DIAGNOSIS — I10 Essential (primary) hypertension: Secondary | ICD-10-CM

## 2022-06-20 DIAGNOSIS — I340 Nonrheumatic mitral (valve) insufficiency: Secondary | ICD-10-CM

## 2022-06-20 DIAGNOSIS — E538 Deficiency of other specified B group vitamins: Secondary | ICD-10-CM

## 2022-06-20 DIAGNOSIS — E7800 Pure hypercholesterolemia, unspecified: Secondary | ICD-10-CM

## 2022-06-20 NOTE — Assessment & Plan Note (Signed)
Chronic, ongoing.  Well-controlled with Protonix at higher dose, continue collaboration with GI.

## 2022-06-20 NOTE — Assessment & Plan Note (Signed)
Noted on echo and murmur noted on exam, continue to collaborate with cardiology.  Recommend she return for visit with them, annual visit.

## 2022-06-20 NOTE — Assessment & Plan Note (Addendum)
Chronic, ongoing, rate controlled.  Followed by cardiology.  Continue this collaboration and current medication regimen as prescribed by them, including Eliquis.  Recommend she schedule follow-up with them.

## 2022-06-20 NOTE — Assessment & Plan Note (Signed)
Chronic, ongoing.   Continue Rosuvastatin and adjust dosing as needed, started last visit.  Recheck lipid panel and CMP today.

## 2022-06-20 NOTE — Assessment & Plan Note (Addendum)
Chronic, ongoing.  Check level today and recommend she take supplement as ordered.

## 2022-06-20 NOTE — Assessment & Plan Note (Signed)
Recent TSH within normal range, no current medications.  Check TSH and Free T4 today + antibody today.

## 2022-06-20 NOTE — Assessment & Plan Note (Signed)
Chronic, ongoing, followed by pulmonary.  Continue current inhaler regimen and adjust as needed.  Return in 6 months.

## 2022-06-20 NOTE — Assessment & Plan Note (Signed)
Chronic, stable, followed by hematology. Recent notes reviewed. Continue collaboration with them.

## 2022-06-20 NOTE — Assessment & Plan Note (Signed)
Chronic, ongoing with BP at goal on recheck today -- increased stressors recently.  Recommend she monitor BP at least a few mornings a week at home and document.  DASH diet at home.  Continue current medication regimen and adjust as needed + collaboration with cardiology.  Labs today: Lipid, TSH, CMP.  Return in 6 months.

## 2022-06-20 NOTE — Assessment & Plan Note (Signed)
Ongoing, followed by hematology and receiving current IM injections monthly.

## 2022-06-20 NOTE — Assessment & Plan Note (Signed)
Chronic, ongoing.  Currently on Amitriptyline by GI for IBS and Fibro.  Continue this medication regimen and adjust as needed.  Continue to collaborate with physiatry.  Return in 6 months.

## 2022-06-20 NOTE — Assessment & Plan Note (Addendum)
Ongoing.  Noted on CT 01/03/2020 -- discussed with patient and educated on this.  Continue Rosuvastatin daily and ASA.

## 2022-06-20 NOTE — Assessment & Plan Note (Signed)
Secondary to Eliquis and A-Fib.  Continue current medication regimen and adjust as needed.

## 2022-06-20 NOTE — Progress Notes (Signed)
BP 136/82 (BP Location: Left Arm)   Pulse (!) 107   Temp 98.2 F (36.8 C) (Oral)   Wt 191 lb 1.6 oz (86.7 kg)   SpO2 98%   BMI 34.95 kg/m    Subjective:    Patient ID: Mary Irwin, female    DOB: 1951/01/18, 71 y.o.   MRN: [id number]  HPI: Mary Irwin is a 71 y.o. female  Chief Complaint  Patient presents with   Hypertension   Hyperlipidemia   Gastroesophageal Reflux   Asthma   Mood    6 month follow up    HYPERTENSION / HYPERLIPIDEMIA + A-FIB Last visit with cardiology on 04/07/21.  Taking Eliquis, Cardizem, HCTZ, and Metoprolol + Rosuvastatin 10 MG. Aortic atherosclerosis noted on imaging 01/03/2020. Has stressor with her mother having dementia and helping care for her.   Satisfied with current treatment? yes Duration of hypertension: chronic BP monitoring frequency: rarely BP range:  BP medication side effects: no Past BP meds: unsure Aspirin: no Recent stressors: no Recurrent headaches: no Visual changes: no Palpitations: no Dyspnea: no Chest pain: no Lower extremity edema: occasional Dizzy/lightheaded: no The 10-year ASCVD risk score (Arnett DK, et al., 2019) is: 15.8%   Values used to calculate the score:     Age: 80 years     Sex: Female     Is Non-Hispanic African American: No     Diabetic: No     Tobacco smoker: No     Systolic Blood Pressure: 580 mmHg     Is BP treated: Yes     HDL Cholesterol: 48 mg/dL     Total Cholesterol: 190 mg/dL    ANEMIA Followed by hematology -- last visit was 05/17/22, continues with iron infusions and B12 injections.  History of thyroid labs elevations -- recent were stable.   Anemia status: controlled Etiology of anemia: iron deficiency Severity of anemia: moderate Fatigue: at times Decreased exercise tolerance: no Dyspnea on exertion: no Palpitations: no Bleeding: no Pica: no    GERD Taking Protonix 40 MG BID + Amitriptyline.  Last saw GI 12/22/21 - [facility], Dr. Chauncey Mann.  Followed by them for IBS (with  constipation) and fibromyalgia + GERD.  They increased her Amitriptyline to 25 MG.   She has chronic pain with with Fibromyalgia -- she does not recall what she has taken.  Following with physiatry -- last visit 03/11/21, spondylosis lumbar -- they were going to evaluate for spinal cord stimulator.  History of low Vit D 15.7, not taking supplement at this time. GERD control status: controlled Satisfied with current treatment? yes Heartburn frequency: none Medication side effects: no  Medication compliance: stable Previous GERD medications: none Antacid use frequency:  none lately Duration: none Nature: none Dysphagia: no Odynophagia:  no Hematemesis: no Blood in stool: no EGD: 07/31/2017   ASTHMA Continues on Breo and Albuterol.  Last saw pulmonary, Dr. Raul Del, 01/25/22. Asthma status: uncontrolled Satisfied with current treatment?: yes Albuterol/rescue inhaler frequency: twice a week Dyspnea frequency: with activity Wheezing frequency: daily Cough frequency: daily Nocturnal symptom frequency: yes Limitation of activity: no Current upper respiratory symptoms: no Triggers: unknown Home peak flows: none Last Spirometry: with pulmonary Failed/intolerant to following asthma meds: none Asthma meds in past: Breo Aerochamber/spacer use: no Visits to ER or Urgent Care in past year: no Pneumovax: Up to Date Influenza: Up to Date      06/20/2022    1:10 PM 12/21/2021   12:13 PM 09/10/2021    2:01 PM 04/23/2021  11:29 AM 04/16/2021   11:00 AM  Depression screen PHQ 2/9  Decreased Interest 0 0 0 0 0  Down, Depressed, Hopeless 0 0 0 0 0  PHQ - 2 Score 0 0 0 0 0  Altered sleeping 0 1   0  Tired, decreased energy 2 1   0  Change in appetite 0 0   0  Feeling bad or failure about yourself  0 0   0  Trouble concentrating 0 0   0  Moving slowly or fidgety/restless 0 0   0  Suicidal thoughts 0 0   0  PHQ-9 Score 2 2   0  Difficult doing work/chores Not difficult at all Not difficult at  all        Relevant past medical, surgical, family and social history reviewed and updated as indicated. Interim medical history since our last visit reviewed. Allergies and medications reviewed and updated.  Review of Systems  Constitutional:  Negative for activity change, appetite change, diaphoresis, fatigue and fever.  Respiratory:  Positive for cough, shortness of breath (occasional) and wheezing. Negative for chest tightness.   Cardiovascular:  Negative for chest pain, palpitations and leg swelling.  Gastrointestinal:  Positive for constipation and nausea (occasional). Negative for abdominal distention, abdominal pain, blood in stool, diarrhea and vomiting.  Neurological: Negative.   Psychiatric/Behavioral: Negative.      Per HPI unless specifically indicated above     Objective:    BP 136/82 (BP Location: Left Arm)   Pulse (!) 107   Temp 98.2 F (36.8 C) (Oral)   Wt 191 lb 1.6 oz (86.7 kg)   SpO2 98%   BMI 34.95 kg/m   Wt Readings from Last 3 Encounters:  06/20/22 191 lb 1.6 oz (86.7 kg)  05/17/22 197 lb (89.4 kg)  02/14/22 199 lb 3.2 oz (90.4 kg)    Physical Exam Vitals and nursing note reviewed.  Constitutional:      General: She is awake. She is not in acute distress.    Appearance: She is well-developed and well-groomed. She is obese. She is not ill-appearing or toxic-appearing.  HENT:     Head: Normocephalic and atraumatic.     Right Ear: Hearing normal.     Left Ear: Hearing normal.     Nose: Nose normal.  Eyes:     General: Lids are normal.        Right eye: No discharge.        Left eye: No discharge.     Conjunctiva/sclera: Conjunctivae normal.     Pupils: Pupils are equal, round, and reactive to light.  Neck:     Thyroid: No thyromegaly.     Vascular: No carotid bruit.  Cardiovascular:     Rate and Rhythm: Normal rate and regular rhythm.     Heart sounds: Murmur heard.     Systolic murmur is present with a grade of 3/6.     No gallop.   Pulmonary:     Effort: Pulmonary effort is normal. No accessory muscle usage or respiratory distress.     Breath sounds: Wheezing present.     Comments: Scattered wheezes intermittent throughout with dry cough. Abdominal:     General: Bowel sounds are normal. There is no distension.     Palpations: Abdomen is soft. There is no hepatomegaly.     Tenderness: There is no abdominal tenderness.  Musculoskeletal:     Cervical back: Normal range of motion and neck supple.  Right lower leg: Edema (trace) present.     Left lower leg: Edema (trace) present.  Lymphadenopathy:     Cervical: No cervical adenopathy.  Skin:    General: Skin is warm and dry.  Neurological:     Mental Status: She is alert and oriented to person, place, and time.     Deep Tendon Reflexes: Reflexes are normal and symmetric.     Reflex Scores:      Brachioradialis reflexes are 2+ on the right side and 2+ on the left side.      Patellar reflexes are 2+ on the right side and 2+ on the left side. Psychiatric:        Attention and Perception: Attention normal.        Mood and Affect: Mood normal.        Speech: Speech normal.        Behavior: Behavior normal. Behavior is cooperative.        Thought Content: Thought content normal.     Results for orders placed or performed in visit on 05/17/22  Vitamin B12  Result Value Ref Range   Vitamin B-12 339 180 - 914 pg/mL  Ferritin  Result Value Ref Range   Ferritin 14 11 - 307 ng/mL  CBC with Differential/Platelet  Result Value Ref Range   WBC 7.8 4.0 - 10.5 K/uL   RBC 4.51 3.87 - 5.11 MIL/uL   Hemoglobin 11.2 (L) 12.0 - 15.0 g/dL   HCT 36.3 36.0 - 46.0 %   MCV 80.5 80.0 - 100.0 fL   MCH 24.8 (L) 26.0 - 34.0 pg   MCHC 30.9 30.0 - 36.0 g/dL   RDW 15.6 (H) 11.5 - 15.5 %   Platelets 348 150 - 400 K/uL   nRBC 0.0 0.0 - 0.2 %   Neutrophils Relative % 68 %   Neutro Abs 5.4 1.7 - 7.7 K/uL   Lymphocytes Relative 19 %   Lymphs Abs 1.5 0.7 - 4.0 K/uL   Monocytes  Relative 7 %   Monocytes Absolute 0.5 0.1 - 1.0 K/uL   Eosinophils Relative 5 %   Eosinophils Absolute 0.4 0.0 - 0.5 K/uL   Basophils Relative 1 %   Basophils Absolute 0.1 0.0 - 0.1 K/uL   Immature Granulocytes 0 %   Abs Immature Granulocytes 0.02 0.00 - 0.07 K/uL  Iron and TIBC  Result Value Ref Range   Iron 33 28 - 170 ug/dL   TIBC 561 (H) 250 - 450 ug/dL   Saturation Ratios 6 (L) 10.4 - 31.8 %   UIBC 528 ug/dL      Assessment & Plan:   Problem List Items Addressed This Visit       Cardiovascular and Mediastinum   Aortic atherosclerosis ([place])    Ongoing.  Noted on CT 01/03/2020 -- discussed with patient and educated on this.  Continue Rosuvastatin daily and ASA.      Relevant Orders   Comprehensive metabolic panel   Lipid Panel w/o Chol/HDL Ratio   Essential hypertension    Chronic, ongoing with BP at goal on recheck today -- increased stressors recently.  Recommend she monitor BP at least a few mornings a week at home and document.  DASH diet at home.  Continue current medication regimen and adjust as needed + collaboration with cardiology.  Labs today: Lipid, TSH, CMP.  Return in 6 months.      Relevant Orders   Microalbumin, Urine Waived   Comprehensive metabolic panel   Mitral  regurgitation    Noted on echo and murmur noted on exam, continue to collaborate with cardiology.  Recommend she return for visit with them, annual visit.      Paroxysmal atrial fibrillation ([facility]) - Primary    Chronic, ongoing, rate controlled.  Followed by cardiology.  Continue this collaboration and current medication regimen as prescribed by them, including Eliquis.  Recommend she schedule follow-up with them.      Relevant Orders   Comprehensive metabolic panel   Lipid Panel w/o Chol/HDL Ratio     Respiratory   Asthma without status asthmaticus    Chronic, ongoing, followed by pulmonary.  Continue current inhaler regimen and adjust as needed.  Return in 6 months.        Digestive    GERD without esophagitis    Chronic, ongoing.  Well-controlled with Protonix at higher dose, continue collaboration with GI.        Relevant Orders   Magnesium     Endocrine   Thyroid disease    Recent TSH within normal range, no current medications.  Check TSH and Free T4 today + antibody today.      Relevant Orders   T4, free   Thyroid peroxidase antibody   TSH     Hematopoietic and Hemostatic   Other thrombophilia ([place])    Secondary to Eliquis and A-Fib.  Continue current medication regimen and adjust as needed.          Other   B12 deficiency    Ongoing, followed by hematology and receiving current IM injections monthly.      Fibromyalgia    Chronic, ongoing.  Currently on Amitriptyline by GI for IBS and Fibro.  Continue this medication regimen and adjust as needed.  Continue to collaborate with physiatry.  Return in 6 months.      Relevant Medications   amitriptyline (ELAVIL) 25 MG tablet   Other Relevant Orders   Comprehensive metabolic panel   Magnesium   Hyperlipemia    Chronic, ongoing.   Continue Rosuvastatin and adjust dosing as needed, started last visit.  Recheck lipid panel and CMP today.      Relevant Orders   Comprehensive metabolic panel   Lipid Panel w/o Chol/HDL Ratio   Iron deficiency anemia    Chronic, stable, followed by hematology. Recent notes reviewed. Continue collaboration with them.      Vitamin D deficiency    Chronic, ongoing.  Check level today and recommend she take supplement as ordered.      Relevant Orders   VITAMIN D 25 Hydroxy (Vit-D Deficiency, Fractures)   Other Visit Diagnoses     Need for hepatitis C screening test       Hep C screen on labs today per guidelines for one time screening, discussed with patient.   Relevant Orders   Hepatitis C antibody        Follow up plan: Return in about 6 months (around 12/21/2022) for HTN/HLD, GERD, ANEMIA, ASTHMA.

## 2022-06-21 LAB — COMPREHENSIVE METABOLIC PANEL
ALT: 25 IU/L (ref 0–32)
AST: 33 IU/L (ref 0–40)
Albumin/Globulin Ratio: 1.8 (ref 1.2–2.2)
Albumin: 4.6 g/dL (ref 3.8–4.8)
Alkaline Phosphatase: 129 IU/L — ABNORMAL HIGH (ref 44–121)
BUN/Creatinine Ratio: 15 (ref 12–28)
BUN: 12 mg/dL (ref 8–27)
Bilirubin Total: 0.2 mg/dL (ref 0.0–1.2)
CO2: 23 mmol/L (ref 20–29)
Calcium: 10.6 mg/dL — ABNORMAL HIGH (ref 8.7–10.3)
Chloride: 102 mmol/L (ref 96–106)
Creatinine, Ser: 0.79 mg/dL (ref 0.57–1.00)
Globulin, Total: 2.5 g/dL (ref 1.5–4.5)
Glucose: 90 mg/dL (ref 70–99)
Potassium: 4.4 mmol/L (ref 3.5–5.2)
Sodium: 141 mmol/L (ref 134–144)
Total Protein: 7.1 g/dL (ref 6.0–8.5)
eGFR: 80 mL/min/{1.73_m2} (ref >59)

## 2022-06-21 LAB — LIPID PANEL W/O CHOL/HDL RATIO
Cholesterol, Total: 163 mg/dL (ref 100–199)
HDL: 56 mg/dL (ref >39)
LDL Chol Calc (NIH): 84 mg/dL (ref 0–99)
Triglycerides: 134 mg/dL (ref 0–149)
VLDL Cholesterol Cal: 23 mg/dL (ref 5–40)

## 2022-06-21 LAB — VITAMIN D 25 HYDROXY (VIT D DEFICIENCY, FRACTURES): Vit D, 25-Hydroxy: 92.5 ng/mL (ref 30.0–100.0)

## 2022-06-21 LAB — THYROID PEROXIDASE ANTIBODY: Thyroperoxidase Ab SerPl-aCnc: 13 IU/mL (ref 0–34)

## 2022-06-21 LAB — TSH: TSH: 4.34 u[IU]/mL (ref 0.450–4.500)

## 2022-06-21 LAB — HEPATITIS C ANTIBODY: Hep C Virus Ab: NONREACTIVE

## 2022-06-21 LAB — MAGNESIUM: Magnesium: 2 mg/dL (ref 1.6–2.3)

## 2022-06-21 LAB — T4, FREE: Free T4: 1.01 ng/dL (ref 0.82–1.77)

## 2022-06-21 NOTE — Progress Notes (Signed)
Contacted via MyChart   Good day Mary Irwin, labs have returned and are overall stable.  Cholesterol levels improved this check, continue Rosuvastatin.  Vitamin D level improving, continue supplement.  Calcium a little elevated, if taking calcium supplement or TUMS cut back on this to every other day or every 3rd day.  Any questions? Keep being amazing!!  Thank you for allowing me to participate in your care.  I appreciate you. Kindest regards, Sufian Ravi

## 2022-06-22 LAB — MICROALBUMIN, URINE WAIVED
Creatinine, Urine Waived: 200 mg/dL (ref 10–300)
Microalb, Ur Waived: 30 mg/L — ABNORMAL HIGH (ref 0–19)
Microalb/Creat Ratio: <30 mg/g (ref <30)

## 2022-06-22 NOTE — Progress Notes (Signed)
Contacted via MyChart   Good evening Mary Irwin, urine shows a little protein in it -- I would recommend we monitor this every year.  You can not take the ACE family (like Lisinopril) due to swelling in past, these are the best kidney protective medications for protein in urine.  For now I would recommend ensuring good water intake daily and adding a little lemon to this for kidney health.  Any questions? Keep being amazing!!  Thank you for allowing me to participate in your care.  I appreciate you. Kindest regards, Ovida Delagarza

## 2022-07-07 ENCOUNTER — Encounter: Payer: Self-pay | Admitting: Nurse Practitioner

## 2022-07-18 ENCOUNTER — Inpatient Hospital Stay: Payer: Medicare (Managed Care) | Attending: Internal Medicine

## 2022-07-18 VITALS — BP 155/85 | HR 103 | Temp 97.5°F | Resp 16

## 2022-07-18 DIAGNOSIS — Z79899 Other long term (current) drug therapy: Secondary | ICD-10-CM | POA: Diagnosis not present

## 2022-07-18 DIAGNOSIS — D509 Iron deficiency anemia, unspecified: Secondary | ICD-10-CM | POA: Insufficient documentation

## 2022-07-18 DIAGNOSIS — E538 Deficiency of other specified B group vitamins: Secondary | ICD-10-CM | POA: Insufficient documentation

## 2022-07-18 MED ORDER — CYANOCOBALAMIN 1000 MCG/ML IJ SOLN
1000.0000 ug | Freq: Once | INTRAMUSCULAR | Status: AC
  Administered 2022-07-18: 1000 ug via INTRAMUSCULAR
  Filled 2022-07-18: qty 1

## 2022-07-18 MED ORDER — SODIUM CHLORIDE 0.9 % IV SOLN
200.0000 mg | Freq: Once | INTRAVENOUS | Status: AC
  Administered 2022-07-18: 200 mg via INTRAVENOUS
  Filled 2022-07-18: qty 200

## 2022-07-18 MED ORDER — SODIUM CHLORIDE 0.9 % IV SOLN
Freq: Once | INTRAVENOUS | Status: AC
  Filled 2022-07-18: qty 250

## 2022-07-18 NOTE — Patient Instructions (Signed)
[facility]CER CTR AT [place]  Discharge Instructions: Thank you for choosing [facility] to provide your oncology and hematology care.  If you have a lab appointment with the [facility], please go directly to the [facility] and check in at the registration area.  Wear comfortable clothing and clothing appropriate for easy access to any Portacath or PICC line.   We strive to give you quality time with your provider. You may need to reschedule your appointment if you arrive late (15 or more minutes).  Arriving late affects you and other patients whose appointments are after yours.  Also, if you miss three or more appointments without notifying the office, you may be dismissed from the clinic at the provider's discretion.      For prescription refill requests, have your pharmacy contact our office and allow 72 hours for refills to be completed.    Today you received the following chemotherapy and/or immunotherapy agents Venofer      To help prevent nausea and vomiting after your treatment, we encourage you to take your nausea medication as directed.  BELOW ARE SYMPTOMS THAT SHOULD BE REPORTED IMMEDIATELY: *FEVER GREATER THAN 100.4 F (38 C) OR HIGHER *CHILLS OR SWEATING *NAUSEA AND VOMITING THAT IS NOT CONTROLLED WITH YOUR NAUSEA MEDICATION *UNUSUAL SHORTNESS OF BREATH *UNUSUAL BRUISING OR BLEEDING *URINARY PROBLEMS (pain or burning when urinating, or frequent urination) *BOWEL PROBLEMS (unusual diarrhea, constipation, pain near the anus) TENDERNESS IN MOUTH AND THROAT WITH OR WITHOUT PRESENCE OF ULCERS (sore throat, sores in mouth, or a toothache) UNUSUAL RASH, SWELLING OR PAIN  UNUSUAL VAGINAL DISCHARGE OR ITCHING   Items with * indicate a potential emergency and should be followed up as soon as possible or go to the Emergency Department if any problems should occur.  Please show the CHEMOTHERAPY ALERT CARD or IMMUNOTHERAPY ALERT CARD at check-in to the  Emergency Department and triage nurse.  Should you have questions after your visit or need to cancel or reschedule your appointment, please contact [facility] CANCER [place] AT [place]  [phone] and follow the prompts.  Office hours are 8:00 a.m. to 4:30 p.m. Monday - Friday. Please note that voicemails left after 4:00 p.m. may not be returned until the following business day.  We are closed weekends and major holidays. You have access to a nurse at all times for urgent questions. Please call the main number to the clinic [phone] and follow the prompts.  For any non-urgent questions, you may also contact your provider using MyChart. We now offer e-Visits for anyone 63 and older to request care online for non-urgent symptoms. For details visit [email or web address].   Also download the MyChart app! Go to the app store, search "MyChart", open the app, select [facility], and log in with your MyChart username and password.  Masks are optional in the cancer centers. If you would like for your care team to wear a mask while they are taking care of you, please let them know. For doctor visits, patients may have with them one support person who is at least 71 years old. At this time, visitors are not allowed in the infusion area.  Iron Sucrose Injection What is this medication? IRON SUCROSE (EYE ern SOO krose) treats low levels of iron (iron deficiency anemia) in people with kidney disease. Iron is a mineral that plays an important role in making red blood cells, which carry oxygen from your lungs to the rest of your body. This medicine may be  used for other purposes; ask your health care provider or pharmacist if you have questions. COMMON BRAND NAME(S): Venofer What should I tell my care team before I take this medication? They need to know if you have any of these conditions: Anemia not caused by low iron levels Heart disease High levels of iron in the blood Kidney disease Liver  disease An unusual or allergic reaction to iron, other medications, foods, dyes, or preservatives Pregnant or trying to get pregnant Breast-feeding How should I use this medication? This medication is for infusion into a vein. It is given in a hospital or clinic setting. Talk to your care team about the use of this medication in children. While this medication may be prescribed for children as young as 2 years for selected conditions, precautions do apply. Overdosage: If you think you have taken too much of this medicine contact a poison control center or emergency room at once. NOTE: This medicine is only for you. Do not share this medicine with others. What if I miss a dose? It is important not to miss your dose. Call your care team if you are unable to keep an appointment. What may interact with this medication? Do not take this medication with any of the following: Deferoxamine Dimercaprol Other iron products This medication may also interact with the following: Chloramphenicol Deferasirox This list may not describe all possible interactions. Give your health care provider a list of all the medicines, herbs, non-prescription drugs, or dietary supplements you use. Also tell them if you smoke, drink alcohol, or use illegal drugs. Some items may interact with your medicine. What should I watch for while using this medication? Visit your care team regularly. Tell your care team if your symptoms do not start to get better or if they get worse. You may need blood work done while you are taking this medication. You may need to follow a special diet. Talk to your care team. Foods that contain iron include: whole grains/cereals, dried fruits, beans, or peas, leafy green vegetables, and organ meats (liver, kidney). What side effects may I notice from receiving this medication? Side effects that you should report to your care team as soon as possible: Allergic reactions--skin rash, itching, hives,  swelling of the face, lips, tongue, or throat Low blood pressure--dizziness, feeling faint or lightheaded, blurry vision Shortness of breath Side effects that usually do not require medical attention (report to your care team if they continue or are bothersome): Flushing Headache Joint pain Muscle pain Nausea Pain, redness, or irritation at injection site This list may not describe all possible side effects. Call your doctor for medical advice about side effects. You may report side effects to FDA at 1-800-FDA-1088. Where should I keep my medication? This medication is given in a hospital or clinic and will not be stored at home. NOTE: This sheet is a summary. It may not cover all possible information. If you have questions about this medicine, talk to your doctor, pharmacist, or health care provider.  2023 Elsevier/Gold Standard (2007-12-15 00:00:00)   

## 2022-08-17 ENCOUNTER — Inpatient Hospital Stay: Payer: Medicare (Managed Care) | Attending: Internal Medicine

## 2022-08-17 VITALS — BP 129/54 | HR 78 | Temp 97.0°F | Resp 16

## 2022-08-17 DIAGNOSIS — Z79899 Other long term (current) drug therapy: Secondary | ICD-10-CM | POA: Insufficient documentation

## 2022-08-17 DIAGNOSIS — D509 Iron deficiency anemia, unspecified: Secondary | ICD-10-CM | POA: Insufficient documentation

## 2022-08-17 DIAGNOSIS — E538 Deficiency of other specified B group vitamins: Secondary | ICD-10-CM | POA: Insufficient documentation

## 2022-08-17 MED ORDER — SODIUM CHLORIDE 0.9 % IV SOLN
Freq: Once | INTRAVENOUS | Status: AC
  Filled 2022-08-17: qty 250

## 2022-08-17 MED ORDER — CYANOCOBALAMIN 1000 MCG/ML IJ SOLN
1000.0000 ug | Freq: Once | INTRAMUSCULAR | Status: AC
  Administered 2022-08-17: 1000 ug via INTRAMUSCULAR
  Filled 2022-08-17: qty 1

## 2022-08-17 MED ORDER — SODIUM CHLORIDE 0.9 % IV SOLN
200.0000 mg | Freq: Once | INTRAVENOUS | Status: AC
  Administered 2022-08-17: 200 mg via INTRAVENOUS
  Filled 2022-08-17: qty 200

## 2022-09-04 ENCOUNTER — Other Ambulatory Visit: Payer: Self-pay | Admitting: Nurse Practitioner

## 2022-09-05 NOTE — Telephone Encounter (Signed)
Requested medication (s) are due for refill today: yes  Requested medication (s) are on the active medication list: yes  Last refill:  06/15/21  Future visit scheduled: yes  Notes to clinic:  Manual Review: Route requests for 50,000 IU strength to the provider.     Requested Prescriptions  Pending Prescriptions Disp Refills   Cholecalciferol (VITAMIN D3) 1.25 MG (50000 UT) CAPS [Pharmacy Med Name: VITAMIN D3 50,000 IU (CHOLE) CAP] 12 capsule 4    Sig: TAKE 1 CAPSULE BY MOUTH 1 TIME A WEEK     Endocrinology:  Vitamins - Vitamin D Supplementation 2 Failed - 09/04/2022 10:43 AM      Failed - Manual Review: Route requests for 50,000 IU strength to the provider      Failed - Ca in normal range and within 360 days    Calcium  Date Value Ref Range Status  06/20/2022 10.6 (H) 8.7 - 10.3 mg/dL Final         Passed - Vitamin D in normal range and within 360 days    Vit D, 25-Hydroxy  Date Value Ref Range Status  06/20/2022 92.5 30.0 - 100.0 ng/mL Final    Comment:    Vitamin D deficiency has been defined by the Institute of Medicine and an Endocrine Society practice guideline as a level of serum 25-OH vitamin D less than 20 ng/mL (1,2). The Endocrine Society went on to further define vitamin D insufficiency as a level between 21 and 29 ng/mL (2). 1. IOM (Institute of Medicine). 2010. Dietary reference    intakes for calcium and D. [place]: The    [employer]. 2. Holick MF, Binkley Shiloh, Bischoff-Ferrari HA, et al.    Evaluation, treatment, and prevention of vitamin D    deficiency: an Endocrine Society clinical practice    guideline. JCEM. 2011 Jul; 96(7):1911-30.          Passed - Valid encounter within last 12 months    Recent Outpatient Visits           2 months ago Paroxysmal atrial fibrillation ([place])   [place], [place] T, NP   8 months ago Paroxysmal atrial fibrillation ([place])   [place], [address] T, NP   1  year ago Iron deficiency anemia, unspecified iron deficiency anemia type   [place], [place] T, NP   1 year ago Iron deficiency anemia, unspecified iron deficiency anemia type   [place], [place] T, NP   1 year ago Iron deficiency anemia, unspecified iron deficiency anemia type   [place], Lauren A, NP       Future Appointments             In 1 week  [place], [place]   In 3 months [place], Barbaraann Faster, NP [employer], [facility]

## 2022-09-12 ENCOUNTER — Ambulatory Visit (INDEPENDENT_AMBULATORY_CARE_PROVIDER_SITE_OTHER): Payer: Medicare (Managed Care) | Admitting: *Deleted

## 2022-09-12 DIAGNOSIS — Z0000 Encounter for general adult medical examination without abnormal findings: Secondary | ICD-10-CM | POA: Diagnosis not present

## 2022-09-12 NOTE — Patient Instructions (Signed)
Mary Irwin , Thank you for taking time to come for your Medicare Wellness Visit. I appreciate your ongoing commitment to your health goals. Please review the following plan we discussed and let me know if I can assist you in the future.   These are the goals we discussed:  Goals      Patient Stated     09/10/2021, wants to get stomach worked out     Patient Stated     Would like to have better health        This is a list of the screening recommended for you and due dates:  Health Maintenance  Topic Date Due   COVID-19 Vaccine (7 - Pfizer risk series) 09/28/2022*   Mammogram  06/21/2023*   Medicare Annual Wellness Visit  09/13/2023   Tetanus Vaccine  01/21/2030   Colon Cancer Screening  08/19/2031   Pneumonia Vaccine  Completed   Flu Shot  Completed   DEXA scan (bone density measurement)  Completed   Hepatitis C Screening: USPSTF Recommendation to screen - Ages 33-79 yo.  Completed   Zoster (Shingles) Vaccine  Completed   HPV Vaccine  Aged Out  *Topic was postponed. The date shown is not the original due date.    Advanced directives: Education provided  Conditions/risks identified:   Next appointment: Follow up in one year for your annual wellness visit 12-21-2021 @ 1:00 [facility] 65 Years and Older, Female Preventive care refers to lifestyle choices and visits with your health care provider that can promote health and wellness. What does preventive care include? A yearly physical exam. This is also called an annual well check. Dental exams once or twice a year. Routine eye exams. Ask your health care provider how often you should have your eyes checked. Personal lifestyle choices, including: Daily care of your teeth and gums. Regular physical activity. Eating a healthy diet. Avoiding tobacco and drug use. Limiting alcohol use. Practicing safe sex. Taking low-dose aspirin every day. Taking vitamin and mineral supplements as recommended by your health  care provider. What happens during an annual well check? The services and screenings done by your health care provider during your annual well check will depend on your age, overall health, lifestyle risk factors, and family history of disease. Counseling  Your health care provider may ask you questions about your: Alcohol use. Tobacco use. Drug use. Emotional well-being. Home and relationship well-being. Sexual activity. Eating habits. History of falls. Memory and ability to understand (cognition). Work and work Statistician. Reproductive health. Screening  You may have the following tests or measurements: Height, weight, and BMI. Blood pressure. Lipid and cholesterol levels. These may be checked every 5 years, or more frequently if you are over 55 years old. Skin check. Lung cancer screening. You may have this screening every year starting at age 63 if you have a 30-pack-year history of smoking and currently smoke or have quit within the past 15 years. Fecal occult blood test (FOBT) of the stool. You may have this test every year starting at age 54. Flexible sigmoidoscopy or colonoscopy. You may have a sigmoidoscopy every 5 years or a colonoscopy every 10 years starting at age 11. Hepatitis C blood test. Hepatitis B blood test. Sexually transmitted disease (STD) testing. Diabetes screening. This is done by checking your blood sugar (glucose) after you have not eaten for a while (fasting). You may have this done every 1-3 years. Bone density scan. This is done to screen for osteoporosis.  You may have this done starting at age 5. Mammogram. This may be done every 1-2 years. Talk to your health care provider about how often you should have regular mammograms. Talk with your health care provider about your test results, treatment options, and if necessary, the need for more tests. Vaccines  Your health care provider may recommend certain vaccines, such as: Influenza vaccine. This is  recommended every year. Tetanus, diphtheria, and acellular pertussis (Tdap, Td) vaccine. You may need a Td booster every 10 years. Zoster vaccine. You may need this after age 59. Pneumococcal 13-valent conjugate (PCV13) vaccine. One dose is recommended after age 58. Pneumococcal polysaccharide (PPSV23) vaccine. One dose is recommended after age 71. Talk to your health care provider about which screenings and vaccines you need and how often you need them. This information is not intended to replace advice given to you by your health care provider. Make sure you discuss any questions you have with your health care provider. Document Released: 11/20/2015 Document Revised: 07/13/2016 Document Reviewed: 08/25/2015 Elsevier Interactive Patient Education  2017 [place] Prevention in the Home Falls can cause injuries. They can happen to people of all ages. There are many things you can do to make your home safe and to help prevent falls. What can I do on the outside of my home? Regularly fix the edges of walkways and driveways and fix any cracks. Remove anything that might make you trip as you walk through a door, such as a raised step or threshold. Trim any bushes or trees on the path to your home. Use bright outdoor lighting. Clear any walking paths of anything that might make someone trip, such as rocks or tools. Regularly check to see if handrails are loose or broken. Make sure that both sides of any steps have handrails. Any raised decks and porches should have guardrails on the edges. Have any leaves, snow, or ice cleared regularly. Use sand or salt on walking paths during winter. Clean up any spills in your garage right away. This includes oil or grease spills. What can I do in the bathroom? Use night lights. Install grab bars by the toilet and in the tub and shower. Do not use towel bars as grab bars. Use non-skid mats or decals in the tub or shower. If you need to sit down in  the shower, use a plastic, non-slip stool. Keep the floor dry. Clean up any water that spills on the floor as soon as it happens. Remove soap buildup in the tub or shower regularly. Attach bath mats securely with double-sided non-slip rug tape. Do not have throw rugs and other things on the floor that can make you trip. What can I do in the bedroom? Use night lights. Make sure that you have a light by your bed that is easy to reach. Do not use any sheets or blankets that are too big for your bed. They should not hang down onto the floor. Have a firm chair that has side arms. You can use this for support while you get dressed. Do not have throw rugs and other things on the floor that can make you trip. What can I do in the kitchen? Clean up any spills right away. Avoid walking on wet floors. Keep items that you use a lot in easy-to-reach places. If you need to reach something above you, use a strong step stool that has a grab bar. Keep electrical cords out of the way. Do not use  floor polish or wax that makes floors slippery. If you must use wax, use non-skid floor wax. Do not have throw rugs and other things on the floor that can make you trip. What can I do with my stairs? Do not leave any items on the stairs. Make sure that there are handrails on both sides of the stairs and use them. Fix handrails that are broken or loose. Make sure that handrails are as long as the stairways. Check any carpeting to make sure that it is firmly attached to the stairs. Fix any carpet that is loose or worn. Avoid having throw rugs at the top or bottom of the stairs. If you do have throw rugs, attach them to the floor with carpet tape. Make sure that you have a light switch at the top of the stairs and the bottom of the stairs. If you do not have them, ask someone to add them for you. What else can I do to help prevent falls? Wear shoes that: Do not have high heels. Have rubber bottoms. Are comfortable  and fit you well. Are closed at the toe. Do not wear sandals. If you use a stepladder: Make sure that it is fully opened. Do not climb a closed stepladder. Make sure that both sides of the stepladder are locked into place. Ask someone to hold it for you, if possible. Clearly mark and make sure that you can see: Any grab bars or handrails. First and last steps. Where the edge of each step is. Use tools that help you move around (mobility aids) if they are needed. These include: Canes. Walkers. Scooters. Crutches. Turn on the lights when you go into a dark area. Replace any light bulbs as soon as they burn out. Set up your furniture so you have a clear path. Avoid moving your furniture around. If any of your floors are uneven, fix them. If there are any pets around you, be aware of where they are. Review your medicines with your doctor. Some medicines can make you feel dizzy. This can increase your chance of falling. Ask your doctor what other things that you can do to help prevent falls. This information is not intended to replace advice given to you by your health care provider. Make sure you discuss any questions you have with your health care provider. Document Released: 08/20/2009 Document Revised: 03/31/2016 Document Reviewed: 11/28/2014 Elsevier Interactive Patient Education  2017 [employer].

## 2022-09-12 NOTE — Progress Notes (Signed)
Subjective:   Mary Irwin is a 71 y.o. female who presents for Medicare Annual (Subsequent) preventive examination.  I connected with  Mary Irwin on 09/12/22 by a telephone enabled telemedicine application and verified that I am speaking with the correct person using two identifiers.   I discussed the limitations of evaluation and management by telemedicine. The patient expressed understanding and agreed to proceed.  Patient location: home  Provider location: Tele-health-home   Review of Systems     Cardiac Risk Factors include: advanced age (>18mn, >>81women);hypertension;obesity (BMI >30kg/m2);sedentary lifestyle     Objective:    Today's Vitals   09/12/22 1232  PainSc: 5    There is no height or weight on file to calculate BMI.     09/12/2022   12:35 PM 05/17/2022   12:55 PM 02/14/2022   12:50 PM 11/12/2021   12:58 PM 09/10/2021    2:00 PM 08/18/2021    9:51 AM 06/11/2021    1:00 PM  Advanced Directives  Does Patient Have a Medical Advance Directive? No No No No No No No  Would patient like information on creating a medical advance directive? No - Patient declined No - Patient declined No - Patient declined No - Patient declined   No - Patient declined    Current Medications (verified) Outpatient Encounter Medications as of 09/12/2022  Medication Sig   albuterol (PROVENTIL HFA;VENTOLIN HFA) 108 (90 Base) MCG/ACT inhaler Inhale 2 puffs into the lungs every 6 (six) hours as needed for wheezing or shortness of breath.   amitriptyline (ELAVIL) 25 MG tablet Take 25 mg by mouth at bedtime.   apixaban (ELIQUIS) 5 MG TABS tablet Take 5 mg by mouth 2 (two) times daily. Am and bedtime   BREO ELLIPTA 200-25 MCG/ACT AEPB INHALE 1 PUFF INTO THE LUNGS DAILY   Cholecalciferol (VITAMIN D3) 1.25 MG (50000 UT) CAPS TAKE 1 CAPSULE BY MOUTH 1 TIME A WEEK   diltiazem (CARDIZEM CD) 300 MG 24 hr capsule Take 300 mg by mouth daily.   hydrochlorothiazide (HYDRODIURIL) 25 MG tablet Take  25 mg by mouth daily. am   Hyoscyamine Sulfate SL 0.125 MG SUBL Place 0.125 mg under the tongue every 6 (six) hours as needed. Place 1 tablet (0.125 mg total) under the tongue every 6 (six) hours as needed for Cramping   LINZESS 72 MCG capsule Take 72 mcg by mouth every morning.   ondansetron (ZOFRAN-ODT) 4 MG disintegrating tablet Take 4 mg by mouth every 8 (eight) hours as needed for nausea or vomiting.   pantoprazole (PROTONIX) 40 MG tablet Take 40 mg by mouth 2 (two) times daily. Am and bedtime   rosuvastatin (CRESTOR) 10 MG tablet TAKE 1 TABLET(10 MG) BY MOUTH DAILY   No facility-administered encounter medications on file as of 09/12/2022.    Allergies (verified) Lisinopril and Metoclopramide   History: Past Medical History:  Diagnosis Date   Anemia    on iron supplements   Arthritis    all over   Asthma    once a week   Atrial fibrillation (HCC)    Chronic seasonal allergic rhinitis due to pollen    Diverticulosis    Dysrhythmia    Atrial Fibrillation   Fibromyalgia    GERD (gastroesophageal reflux disease)    HLD (hyperlipidemia)    Hypertension    Hypothyroidism    no meds   IBS (irritable bowel syndrome)    PONV (postoperative nausea and vomiting)    Psychophysiological insomnia  PVC's (premature ventricular contractions)    Sinus trouble    Past Surgical History:  Procedure Laterality Date   BACK SURGERY     CATARACT EXTRACTION W/PHACO Right 04/22/2019   Procedure: CATARACT EXTRACTION PHACO AND INTRAOCULAR LENS PLACEMENT (IOC) RIGHT;  Surgeon: Eulogio Bear, MD;  Location: [facility];  Service: Ophthalmology;  Laterality: Right;   CATARACT EXTRACTION W/PHACO Left 05/27/2019   Procedure: CATARACT EXTRACTION PHACO AND INTRAOCULAR LENS PLACEMENT ([place])  LEFT;  Surgeon: Eulogio Bear, MD;  Location: [facility];  Service: Ophthalmology;  Laterality: Left;   CHOLECYSTECTOMY N/A 09/22/2017   Procedure: LAPAROSCOPIC CHOLECYSTECTOMY WITH  INTRAOPERATIVE CHOLANGIOGRAM;  Surgeon: Olean Ree, MD;  Location: [facility];  Service: General;  Laterality: N/A;   COLONOSCOPY WITH PROPOFOL N/A 05/23/2017   Procedure: COLONOSCOPY WITH PROPOFOL;  Surgeon: Lollie Sails, MD;  Location: [facility];  Service: Endoscopy;  Laterality: N/A;   COLONOSCOPY WITH PROPOFOL N/A 08/18/2021   Procedure: COLONOSCOPY WITH PROPOFOL;  Surgeon: Toledo, [name], MD;  Location: [facility] ENDOSCOPY;  Service: Gastroenterology;  Laterality: N/A;   ESOPHAGOGASTRODUODENOSCOPY (EGD) WITH PROPOFOL N/A 05/23/2017   Procedure: ESOPHAGOGASTRODUODENOSCOPY (EGD) WITH PROPOFOL;  Surgeon: Lollie Sails, MD;  Location: [facility] END[facility]COPY;  Service: Endoscopy;  Laterality: N/A;   ESOPHAGOGASTRODUODENOSCOPY (EGD) WITH PROPOFOL N/A 07/31/2017   Procedure: ESOPHAGOGASTRODUODENOSCOPY (EGD) WITH PROPOFOL;  Surgeon: Lollie Sails, MD;  Location: [facility] ENDOSCOPY;  Service: Endoscopy;  Laterality: N/A;   ESOPHAGOGASTRODUODENOSCOPY (EGD) WITH PROPOFOL N/A 08/18/2021   Procedure: ESOPHAGOGASTRODUODENOSCOPY (EGD) WITH PROPOFOL;  Surgeon: Toledo, [name], MD;  Location: [facility] ENDOSCOPY;  Service: Gastroenterology;  Laterality: N/A;   EYE SURGERY     GIVENS CAPSULE STUDY     HERNIA REPAIR     umbilical   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   JOINT REPLACEMENT Bilateral    total hip replacement   Family History  Problem Relation Age of Onset   Breast cancer Maternal Aunt 28   Hypertension Mother    Social History   Socioeconomic History   Marital status: Divorced    Spouse name: Not on file   Number of children: Not on file   Years of education: Not on file   Highest education level: Not on file  Occupational History   Not on file  Tobacco Use   Smoking status: Never   Smokeless tobacco: Never  Vaping Use   Vaping Use: Never used  Substance and Sexual Activity   Alcohol use: No   Drug use: No   Sexual activity: Not Currently  Other Topics  Concern   Not on file  Social History Narrative   Not on file   Social Determinants of Health   Financial Resource Strain: Medium Risk (09/12/2022)   Overall Financial Resource Strain (CARDIA)    Difficulty of Paying Living Expenses: Somewhat hard  Food Insecurity: No Food Insecurity (09/12/2022)   Hunger Vital Sign    Worried About Running Out of Food in the Last Year: Never true    [place] in the Last Year: Never true  Transportation Needs: No Transportation Needs (09/12/2022)   PRAPARE - Hydrologist (Medical): No    Lack of Transportation (Non-Medical): No  Physical Activity: Inactive (09/12/2022)   Exercise Vital Sign    Days of Exercise per Week: 0 days    Minutes of Exercise per Session: 0 min  Stress: Stress Concern Present (09/12/2022)   [employer] - Occupational Stress Questionnaire    Feeling of Stress : To some extent  Social Connections: Socially Isolated (09/12/2022)   Social Connection and Isolation Panel [NHANES]    Frequency of Communication with Friends and Family: Three times a week    Frequency of Social Gatherings with Friends and Family: Three times a week    Attends Religious Services: Never    Active Member of Clubs or Organizations: No    Attends Music therapist: Never    Marital Status: Divorced    Tobacco Counseling Counseling given: Not Answered   Clinical Intake:  Pre-visit preparation completed: Yes  Pain : 0-10 Pain Score: 5  Pain Type: Chronic pain Pain Location: Back Pain Orientation: Lower Pain Descriptors / Indicators: Burning, Aching, Constant, Discomfort Pain Onset: 1 to 4 weeks ago Pain Frequency: Intermittent Pain Relieving Factors: ibuprofen, ice, heat  Pain Relieving Factors: ibuprofen, ice, heat  Diabetes: No     Diabetic?  no  Interpreter Needed?: No  Information entered by :: Leroy Kennedy LPN   Activities of Daily Living    09/12/2022    12:41 PM  In your present state of health, do you have any difficulty performing the following activities:  Hearing? 0  Vision? 0  Difficulty concentrating or making decisions? 0  Walking or climbing stairs? 0  Dressing or bathing? 0  Doing errands, shopping? 0  Preparing Food and eating ? N  Using the Toilet? N  In the past six months, have you accidently leaked urine? N  Do you have problems with loss of bowel control? N  Managing your Medications? N  Managing your Finances? N  Housekeeping or managing your Housekeeping? N    Patient Care Team: Venita Lick, NP as PCP - General (Nurse Practitioner) Cammie Sickle, MD as Consulting Physician (Hematology and Oncology)  Indicate any recent Medical Services you may have received from other than [facility] providers in the past year (date may be approximate).     Assessment:   This is a routine wellness examination for [place].  Hearing/Vision screen Hearing Screening - Comments:: No trouble hearing Vision Screening - Comments:: Up to date [facility]  Dietary issues and exercise activities discussed: Current Exercise Habits: The patient does not participate in regular exercise at present   Goals Addressed             This Visit's Progress    Patient Stated       Would like to have better health       Depression Screen    09/12/2022   12:43 PM 06/20/2022    1:10 PM 12/21/2021   12:13 PM 09/10/2021    2:01 PM 04/23/2021   11:29 AM 04/16/2021   11:00 AM  PHQ 2/9 Scores  PHQ - 2 Score 0 0 0 0 0 0  PHQ- 9 Score '1 2 2   '$ 0    Fall Risk    09/12/2022   12:36 PM 06/20/2022    1:10 PM 09/10/2021    2:01 PM 04/23/2021   11:29 AM  Fall Risk   Falls in the past year? 0 0 0 0  Number falls in past yr: 0 0  0  Injury with Fall? 0 0  0  Risk for fall due to :  No Fall Risks Medication side effect No Fall Risks  Follow up Falls evaluation completed;Education provided;Falls prevention discussed Falls  evaluation completed Falls evaluation completed;Education provided;Falls prevention discussed Falls evaluation completed  FALL RISK PREVENTION PERTAINING TO THE HOME:  Any stairs in or around the home? No  If so, are there any without handrails? No  Home free of loose throw rugs in walkways, pet beds, electrical cords, etc? Yes  Adequate lighting in your home to reduce risk of falls? Yes   ASSISTIVE DEVICES UTILIZED TO PREVENT FALLS:  Life alert? No  Use of a cane, walker or w/c? No  Grab bars in the bathroom? No  Shower chair or bench in shower? No  Elevated toilet seat or a handicapped toilet? No   TIMED UP AND GO:  Was the test performed? No .    Cognitive Function:        09/12/2022   12:38 PM 09/10/2021    2:03 PM  6CIT Screen  What Year? 0 points 0 points  What month? 0 points 0 points  What time? 0 points 0 points  Count back from 20 0 points 0 points  Months in reverse 0 points 0 points  Repeat phrase 0 points 2 points  Total Score 0 points 2 points    Immunizations Immunization History  Administered Date(s) Administered   Fluad Quad(high Dose 65+) 07/24/2019, 08/31/2021, 08/31/2022   Influenza, High Dose Seasonal PF 07/13/2017, 08/10/2018, 08/11/2020   Influenza-Unspecified 10/06/2016, 08/09/2018   PFIZER Comirnaty(Gray Top)Covid-19 Tri-Sucrose Vaccine 02/06/2020, 02/27/2020, 11/18/2020   PFIZER(Purple Top)SARS-COV-2 Vaccination 02/06/2020, 02/27/2020, 11/18/2020   Pneumococcal Conjugate-13 02/01/2017, 07/13/2017   Pneumococcal Polysaccharide-23 12/10/2018   Tdap 12/26/2016, 01/22/2020   Zoster Recombinat (Shingrix) 01/27/2022, 04/27/2022    TDAP status: Up to date  Flu Vaccine status: Up to date  Pneumococcal vaccine status: Up to date  Covid-19 vaccine status: Information provided on how to obtain vaccines.   Qualifies for Shingles Vaccine? No   Zostavax completed No   Shingrix Completed?: Yes  Screening Tests Health Maintenance  Topic  Date Due   COVID-19 Vaccine (7 - Pfizer risk series) 09/28/2022 (Originally 01/13/2021)   MAMMOGRAM  06/21/2023 (Originally 01/06/2019)   Medicare Annual Wellness (AWV)  09/13/2023   TETANUS/TDAP  01/21/2030   COLONOSCOPY (Pts 45-24yr Insurance coverage will need to be confirmed)  08/19/2031   Pneumonia Vaccine 71 Years old  Completed   INFLUENZA VACCINE  Completed   DEXA SCAN  Completed   Hepatitis C Screening  Completed   Zoster Vaccines- Shingrix  Completed   HPV VACCINES  Aged Out    Health Maintenance  There are no preventive care reminders to display for this patient.   Colorectal cancer screening: Type of screening: Colonoscopy. Completed 2022. Repeat every 10 years  Mammogram declined  Bone Density status: Completed 2018. Results reflect: Bone density results: NORMAL. Repeat every   years.  Lung Cancer Screening: (Low Dose CT Chest recommended if Age 71-80years, 30 pack-year currently smoking OR have quit w/in 15years.) does not qualify.   Lung Cancer Screening Referral:   Additional Screening:  Hepatitis C Screening: does not qualify; Completed 2023  Vision Screening: Recommended annual ophthalmology exams for early detection of glaucoma and other disorders of the eye. Is the patient up to date with their annual eye exam?  Yes  Who is the provider or what is the name of the office in which the patient attends annual [place] exams[email or web address] not established with a provider, would they like to be referred to a provider to establish care? No .   Dental Screening: Recommended annual dental exams for proper oral hygiene  Community Resource Referral /  Chronic Care Management: CRR required this visit?  No   CCM required this visit?  No      Plan:     I have personally reviewed and noted the following in the patient's chart:   Medical and social history Use of alcohol, tobacco or illicit drugs  Current medications and supplements including opioid  prescriptions. Patient is not currently taking opioid prescriptions. Functional ability and status Nutritional status Physical activity Advanced directives List of other physicians Hospitalizations, surgeries, and ER visits in previous 12 months Vitals Screenings to include cognitive, depression, and falls Referrals and appointments  In addition, I have reviewed and discussed with patient certain preventive protocols, quality metrics, and best practice recommendations. A written personalized care plan for preventive services as well as general preventive health recommendations were provided to patient.     Leroy Kennedy, LPN   [id number]   Nurse Notes:

## 2022-09-19 MED FILL — Iron Sucrose Inj 20 MG/ML (Fe Equiv): INTRAVENOUS | Qty: 10 | Status: AC

## 2022-09-20 ENCOUNTER — Ambulatory Visit: Payer: Medicare (Managed Care)

## 2022-09-20 ENCOUNTER — Inpatient Hospital Stay: Payer: Medicare (Managed Care) | Attending: Internal Medicine

## 2022-09-20 ENCOUNTER — Inpatient Hospital Stay: Payer: Medicare (Managed Care)

## 2022-09-20 ENCOUNTER — Inpatient Hospital Stay (HOSPITAL_BASED_OUTPATIENT_CLINIC_OR_DEPARTMENT_OTHER): Payer: Medicare (Managed Care) | Admitting: Internal Medicine

## 2022-09-20 ENCOUNTER — Encounter: Payer: Self-pay | Admitting: Internal Medicine

## 2022-09-20 ENCOUNTER — Ambulatory Visit: Payer: Medicare (Managed Care) | Admitting: Internal Medicine

## 2022-09-20 VITALS — BP 169/64 | HR 83 | Temp 96.6°F | Resp 18 | Wt 200.7 lb

## 2022-09-20 DIAGNOSIS — R5383 Other fatigue: Secondary | ICD-10-CM | POA: Diagnosis not present

## 2022-09-20 DIAGNOSIS — E785 Hyperlipidemia, unspecified: Secondary | ICD-10-CM | POA: Diagnosis not present

## 2022-09-20 DIAGNOSIS — E538 Deficiency of other specified B group vitamins: Secondary | ICD-10-CM

## 2022-09-20 DIAGNOSIS — Z7901 Long term (current) use of anticoagulants: Secondary | ICD-10-CM | POA: Insufficient documentation

## 2022-09-20 DIAGNOSIS — D509 Iron deficiency anemia, unspecified: Secondary | ICD-10-CM

## 2022-09-20 DIAGNOSIS — E039 Hypothyroidism, unspecified: Secondary | ICD-10-CM | POA: Diagnosis not present

## 2022-09-20 DIAGNOSIS — M797 Fibromyalgia: Secondary | ICD-10-CM | POA: Insufficient documentation

## 2022-09-20 DIAGNOSIS — Z803 Family history of malignant neoplasm of breast: Secondary | ICD-10-CM | POA: Insufficient documentation

## 2022-09-20 DIAGNOSIS — I10 Essential (primary) hypertension: Secondary | ICD-10-CM | POA: Insufficient documentation

## 2022-09-20 DIAGNOSIS — Z79899 Other long term (current) drug therapy: Secondary | ICD-10-CM | POA: Diagnosis not present

## 2022-09-20 DIAGNOSIS — I4891 Unspecified atrial fibrillation: Secondary | ICD-10-CM | POA: Diagnosis not present

## 2022-09-20 LAB — VITAMIN B12: Vitamin B-12: 293 pg/mL (ref 180–914)

## 2022-09-20 LAB — CBC WITH DIFFERENTIAL/PLATELET
Abs Immature Granulocytes: 0.02 10*3/uL (ref 0.00–0.07)
Basophils Absolute: 0.1 10*3/uL (ref 0.0–0.1)
Basophils Relative: 1 %
Eosinophils Absolute: 0.5 10*3/uL (ref 0.0–0.5)
Eosinophils Relative: 6 %
HCT: 39.8 % (ref 36.0–46.0)
Hemoglobin: 12.3 g/dL (ref 12.0–15.0)
Immature Granulocytes: 0 %
Lymphocytes Relative: 21 %
Lymphs Abs: 1.6 10*3/uL (ref 0.7–4.0)
MCH: 25.8 pg — ABNORMAL LOW (ref 26.0–34.0)
MCHC: 30.9 g/dL (ref 30.0–36.0)
MCV: 83.6 fL (ref 80.0–100.0)
Monocytes Absolute: 0.5 10*3/uL (ref 0.1–1.0)
Monocytes Relative: 7 %
Neutro Abs: 5 10*3/uL (ref 1.7–7.7)
Neutrophils Relative %: 65 %
Platelets: 330 10*3/uL (ref 150–400)
RBC: 4.76 MIL/uL (ref 3.87–5.11)
RDW: 17.7 % — ABNORMAL HIGH (ref 11.5–15.5)
WBC: 7.8 10*3/uL (ref 4.0–10.5)
nRBC: 0 % (ref 0.0–0.2)

## 2022-09-20 LAB — FERRITIN: Ferritin: 21 ng/mL (ref 11–307)

## 2022-09-20 LAB — IRON AND TIBC
Iron: 45 ug/dL (ref 28–170)
Saturation Ratios: 9 % — ABNORMAL LOW (ref 10.4–31.8)
TIBC: 512 ug/dL — ABNORMAL HIGH (ref 250–450)
UIBC: 467 ug/dL

## 2022-09-20 MED ORDER — CYANOCOBALAMIN 1000 MCG/ML IJ SOLN
1000.0000 ug | Freq: Once | INTRAMUSCULAR | Status: AC
  Administered 2022-09-20: 1000 ug via INTRAMUSCULAR
  Filled 2022-09-20: qty 1

## 2022-09-20 NOTE — Progress Notes (Signed)
Patient states she is having trouble with bowel movements. It may be 3-4 days before she has a bowel movement,.

## 2022-09-20 NOTE — Progress Notes (Signed)
[place] NOTE  Patient Care Team: Venita Lick, NP as PCP - General (Nurse Practitioner) Cammie Sickle, MD as Consulting Physician (Hematology and Oncology)  CHIEF COMPLAINTS/PURPOSE OF CONSULTATION: Anemia iron deficiency/B12 deficiency  #Anemia-iron/B12 deficiency [LA, NP-July 2022]-s/p EGD colonoscopy [OCT 2022; Dr.Toledo]; hx of capsule [in last 5 years] [JAN 2021]  # A.fib on eliquis.   HISTORY OF PRESENTING ILLNESS: Alone; ambulating independently.   Mary Irwin 71 y.o.  female with a history of B12/iron deficiency of unclear etiology is here for follow-up.  Patient complains of fatigue.  Otherwise denies any blood in stools or black-colored stools.    Review of Systems  Constitutional:  Positive for malaise/fatigue. Negative for chills, diaphoresis, fever and weight loss.  HENT:  Negative for nosebleeds and sore throat.   Eyes:  Negative for double vision.  Respiratory:  Negative for cough, hemoptysis, sputum production, shortness of breath and wheezing.   Cardiovascular:  Negative for chest pain, palpitations, orthopnea and leg swelling.  Gastrointestinal:  Negative for abdominal pain, blood in stool, constipation, diarrhea, heartburn, melena, nausea and vomiting.  Genitourinary:  Negative for dysuria, frequency and urgency.  Musculoskeletal:  Positive for joint pain.  Skin: Negative.  Negative for itching and rash.  Neurological:  Negative for dizziness, tingling, focal weakness, weakness and headaches.  Endo/Heme/Allergies:  Does not bruise/bleed easily.  Psychiatric/Behavioral:  Negative for depression. The patient is not nervous/anxious and does not have insomnia.      MEDICAL HISTORY:  Past Medical History:  Diagnosis Date   Anemia    on iron supplements   Arthritis    all over   Asthma    once a week   Atrial fibrillation (HCC)    Chronic seasonal allergic rhinitis due to pollen    Diverticulosis    Dysrhythmia     Atrial Fibrillation   Fibromyalgia    GERD (gastroesophageal reflux disease)    HLD (hyperlipidemia)    Hypertension    Hypothyroidism    no meds   IBS (irritable bowel syndrome)    PONV (postoperative nausea and vomiting)    Psychophysiological insomnia    PVC's (premature ventricular contractions)    Sinus trouble     SURGICAL HISTORY: Past Surgical History:  Procedure Laterality Date   BACK SURGERY     CATARACT EXTRACTION W/PHACO Right 04/22/2019   Procedure: CATARACT EXTRACTION PHACO AND INTRAOCULAR LENS PLACEMENT ([place]) RIGHT;  Surgeon: Eulogio Bear, MD;  Location: [facility];  Service: Ophthalmology;  Laterality: Right;   CATARACT EXTRACTION W/PHACO Left 05/27/2019   Procedure: CATARACT EXTRACTION PHACO AND INTRAOCULAR LENS PLACEMENT ([place])  LEFT;  Surgeon: Eulogio Bear, MD;  Location: [facility];  Service: Ophthalmology;  Laterality: Left;   CHOLECYSTECTOMY N/A 09/22/2017   Procedure: LAPAROSCOPIC CHOLECYSTECTOMY WITH INTRAOPERATIVE CHOLANGIOGRAM;  Surgeon: Olean Ree, MD;  Location: [facility];  Service: General;  Laterality: N/A;   COLONOSCOPY WITH PROPOFOL N/A 05/23/2017   Procedure: COLONOSCOPY WITH PROPOFOL;  Surgeon: Lollie Sails, MD;  Location: [facility];  Service: Endoscopy;  Laterality: N/A;   COLONOSCOPY WITH PROPOFOL N/A 08/18/2021   Procedure: COLONOSCOPY WITH PROPOFOL;  Surgeon: Toledo, [name], MD;  Location: [facility] ENDOSCOPY;  Service: Gastroenterology;  Laterality: N/A;   ESOPHAGOGASTRODUODENOSCOPY (EGD) WITH PROPOFOL N/A 05/23/2017   Procedure: ESOPHAGOGASTRODUODENOSCOPY (EGD) WITH PROPOFOL;  Surgeon: Lollie Sails, MD;  Location: [facility] ENDOSCOPY;  Service: Endoscopy;  Laterality: N/A;   ESOPHAGOGASTRODUODENOSCOPY (EGD) WITH PROPOFOL N/A 07/31/2017   Procedure: ESOPHAGOGASTRODUODENOSCOPY (  EGD) WITH PROPOFOL;  Surgeon: Lollie Sails, MD;  Location: [facility] ENDOSCOPY;  Service: Endoscopy;  Laterality: N/A;    ESOPHAGOGASTRODUODENOSCOPY (EGD) WITH PROPOFOL N/A 08/18/2021   Procedure: ESOPHAGOGASTRODUODENOSCOPY (EGD) WITH PROPOFOL;  Surgeon: Toledo, [name], MD;  Location: [facility] ENDOSCOPY;  Service: Gastroenterology;  Laterality: N/A;   EYE SURGERY     GIVENS CAPSULE STUDY     HERNIA REPAIR     umbilical   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   JOINT REPLACEMENT Bilateral    total hip replacement    SOCIAL HISTORY: Social History   Socioeconomic History   Marital status: Divorced    Spouse name: Not on file   Number of children: Not on file   Years of education: Not on file   Highest education level: Not on file  Occupational History   Not on file  Tobacco Use   Smoking status: Never   Smokeless tobacco: Never  Vaping Use   Vaping Use: Never used  Substance and Sexual Activity   Alcohol use: No   Drug use: No   Sexual activity: Not Currently  Other Topics Concern   Not on file  Social History Narrative   Not on file   Social Determinants of Health   Financial Resource Strain: Medium Risk (09/12/2022)   Overall Financial Resource Strain (CARDIA)    Difficulty of Paying Living Expenses: Somewhat hard  Food Insecurity: No Food Insecurity (09/12/2022)   Hunger Vital Sign    Worried About Running Out of Food in the Last Year: Never true    Ran Out of Food in the Last Year: Never true  Transportation Needs: No Transportation Needs (09/12/2022)   PRAPARE - Hydrologist (Medical): No    Lack of Transportation (Non-Medical): No  Physical Activity: Inactive (09/12/2022)   Exercise Vital Sign    Days of Exercise per Week: 0 days    Minutes of Exercise per Session: 0 min  Stress: Stress Concern Present (09/12/2022)   [place]    Feeling of Stress : To some extent  Social Connections: Socially Isolated (09/12/2022)   Social Connection and Isolation Panel [NHANES]     Frequency of Communication with Friends and Family: Three times a week    Frequency of Social Gatherings with Friends and Family: Three times a week    Attends Religious Services: Never    Active Member of Clubs or Organizations: No    Attends Archivist Meetings: Never    Marital Status: Divorced  Human resources officer Violence: Not At Risk (09/12/2022)   Humiliation, Afraid, Rape, and Kick questionnaire    Fear of Current or Ex-Partner: No    Emotionally Abused: No    Physically Abused: No    Sexually Abused: No    FAMILY HISTORY: Family History  Problem Relation Age of Onset   Breast cancer Maternal Aunt 35   Hypertension Mother     ALLERGIES:  is allergic to lisinopril and metoclopramide.  MEDICATIONS:  Current Outpatient Medications  Medication Sig Dispense Refill   albuterol (PROVENTIL HFA;VENTOLIN HFA) 108 (90 Base) MCG/ACT inhaler Inhale 2 puffs into the lungs every 6 (six) hours as needed for wheezing or shortness of breath.     amitriptyline (ELAVIL) 25 MG tablet Take 25 mg by mouth at bedtime.     apixaban (ELIQUIS) 5 MG TABS tablet Take 5 mg by mouth 2 (two) times daily. Am and  bedtime     BREO ELLIPTA 200-25 MCG/ACT AEPB INHALE 1 PUFF INTO THE LUNGS DAILY 60 each 4   Cholecalciferol (VITAMIN D3) 1.25 MG (50000 UT) CAPS TAKE 1 CAPSULE BY MOUTH 1 TIME A WEEK 12 capsule 4   diltiazem (CARDIZEM CD) 300 MG 24 hr capsule Take 300 mg by mouth daily.     hydrochlorothiazide (HYDRODIURIL) 25 MG tablet Take 25 mg by mouth daily. am     Hyoscyamine Sulfate SL 0.125 MG SUBL Place 0.125 mg under the tongue every 6 (six) hours as needed. Place 1 tablet (0.125 mg total) under the tongue every 6 (six) hours as needed for Cramping     LINZESS 72 MCG capsule Take 72 mcg by mouth every morning.     ondansetron (ZOFRAN-ODT) 4 MG disintegrating tablet Take 4 mg by mouth every 8 (eight) hours as needed for nausea or vomiting.     pantoprazole (PROTONIX) 40 MG tablet Take 40 mg by mouth  2 (two) times daily. Am and bedtime     rosuvastatin (CRESTOR) 10 MG tablet TAKE 1 TABLET(10 MG) BY MOUTH DAILY 90 tablet 4   No current facility-administered medications for this visit.   Facility-Administered Medications Ordered in Other Visits  Medication Dose Route Frequency Provider Last Rate Last Admin   cyanocobalamin (VITAMIN B12) injection 1,000 mcg  1,000 mcg Intramuscular Once Verlon Au, NP          .  PHYSICAL EXAMINATION:  Vitals:   09/20/22 1309  BP: (!) 169/64  Pulse: 83  Resp: 18  Temp: (!) 96.6 F (35.9 C)  SpO2: 97%   Filed Weights   09/20/22 1309  Weight: 200 lb 11.2 oz (91 kg)    Physical Exam Vitals and nursing note reviewed.  HENT:     Head: Normocephalic and atraumatic.     Mouth/Throat:     Pharynx: Oropharynx is clear.  Eyes:     Extraocular Movements: Extraocular movements intact.     Pupils: Pupils are equal, round, and reactive to light.  Cardiovascular:     Rate and Rhythm: Normal rate and regular rhythm.  Pulmonary:     Comments: Decreased breath sounds bilaterally.  Abdominal:     Palpations: Abdomen is soft.  Musculoskeletal:        General: Normal range of motion.     Cervical back: Normal range of motion.  Skin:    General: Skin is warm.  Neurological:     General: No focal deficit present.     Mental Status: She is alert and oriented to person, place, and time.  Psychiatric:        Behavior: Behavior normal.        Judgment: Judgment normal.     LABORATORY DATA:  I have reviewed the data as listed Lab Results  Component Value Date   WBC 7.8 09/20/2022   HGB 12.3 09/20/2022   HCT 39.8 09/20/2022   MCV 83.6 09/20/2022   PLT 330 09/20/2022   Recent Labs    06/20/22 1335  NA 141  K 4.4  CL 102  CO2 23  GLUCOSE 90  BUN 12  CREATININE 0.79  CALCIUM 10.6*  PROT 7.1  ALBUMIN 4.6  AST 33  ALT 25  ALKPHOS 129*  BILITOT 0.2     RADIOGRAPHIC STUDIES: I have personally reviewed the radiological images  as listed and agreed with the findings in the report. No results found.  B12 deficiency # Iron deficient anemia-unclear etiology.  Completed  IV Venofer x4 in October 2023.  Hemoglobin today is 12.3.  Iron studies are pending.  If levels are low, will call the patient to schedule Venofer.   #Etiology of iron deficient is unclear s/p EGD/colonoscopy [[facility];Ms.Woodard; stool occult+] October 2022; if worsening consider capsule study [previously in last 5 years]/CT imaging- FEB 2021-NEG.    # B12 deficiency- b12 170 [2023. jan]. continue monthly b12 1000 mcg every 4 weeks.  Levels pending today.   # A.fib- on eliquis [Dr.Parashoes]    DISPOSITION: #B12 injection today.  No Venofer today. #Schedule monthly B12 injections #RTC in 4 months to see Dr. B, labs, B12, possible Venofer.  All questions were answered. The patient knows to call the clinic with any problems, questions or concerns.    Jane Canary, MD 09/20/2022 1:31 PM

## 2022-09-23 ENCOUNTER — Encounter: Payer: Self-pay | Admitting: Nurse Practitioner

## 2022-09-23 ENCOUNTER — Ambulatory Visit (INDEPENDENT_AMBULATORY_CARE_PROVIDER_SITE_OTHER): Payer: Medicare (Managed Care) | Admitting: Nurse Practitioner

## 2022-09-23 VITALS — BP 118/68 | HR 81 | Temp 98.6°F | Ht 62.01 in | Wt 200.2 lb

## 2022-09-23 DIAGNOSIS — M5441 Lumbago with sciatica, right side: Secondary | ICD-10-CM | POA: Diagnosis not present

## 2022-09-23 DIAGNOSIS — M25561 Pain in right knee: Secondary | ICD-10-CM | POA: Diagnosis not present

## 2022-09-23 DIAGNOSIS — G8929 Other chronic pain: Secondary | ICD-10-CM | POA: Diagnosis not present

## 2022-09-23 MED ORDER — PREDNISONE 10 MG PO TABS: ORAL_TABLET | ORAL | 0 refills | Status: DC

## 2022-09-23 MED ORDER — LIDOCAINE 5 % EX PTCH: 1.0000 | MEDICATED_PATCH | CUTANEOUS | 0 refills | Status: DC

## 2022-09-23 MED ORDER — TIZANIDINE HCL 4 MG PO TABS: 4.0000 mg | ORAL_TABLET | Freq: Four times a day (QID) | ORAL | 0 refills | Status: DC | PRN

## 2022-09-23 NOTE — Progress Notes (Signed)
BP 118/68 (BP Location: Left Arm, Patient Position: Sitting, Cuff Size: Normal)   Pulse 81   Temp 98.6 F (37 C) (Oral)   Ht 5' 2.01" (1.575 m)   Wt 200 lb 3.2 oz (90.8 kg)   SpO2 95%   BMI 36.61 kg/m    Subjective:    Patient ID: Mary Irwin, female    DOB: 02-28-1951, 71 y.o.   MRN: [id number]  HPI: Mary Irwin is a 71 y.o. female  Chief Complaint  Patient presents with   Back Pain    Lower back pain started about 2 weeks ago.   Knee Pain    Right knee pain started about 2 weeks ago, patient states that it radiates down to her foot   BACK PAIN Starting about 2 weeks ago.  Was helping her mother repot flowers prior to discomfort.  Last MRI lumbar spine was October 2021 noting multilevel degenerative changes with mild left neural foraminal narrowing L2-L3 Duration: weeks Mechanism of injury: unknown Location: bilateral and low back Onset: sudden Severity: 6/10 at present, at times worse Quality: dull, aching, and throbbing Frequency: intermittent Radiation: R leg below the knee Aggravating factors: lifting, movement, walking, bending, and prolonged sitting Alleviating factors:  Lanocaine + Biofreeze Status: fluctuating Treatments attempted: as above  Relief with NSAIDs?: No NSAIDs Taken Nighttime pain:   occasional Paresthesias / decreased sensation:  no Bowel / bladder incontinence:  no Fevers:  no Dysuria / urinary frequency:  no   KNEE PAIN Starting about two weeks ago. Was helping her mother repot flowers prior to discomfort.  Was bending up and down a lot + kneeling. Duration: weeks Involved knee: right Mechanism of injury: unknown Location: lateral Onset: sudden Severity: 6/10  Quality:  dull, aching, and throbbing Frequency: intermittent Radiation: no Aggravating factors: weight bearing, walking, movement, and prolonged sitting  Alleviating factors: rest & Biofreeze Status: stable Treatments attempted: as above  Relief with NSAIDs?:  No NSAIDs  Taken Weakness with weight bearing or walking: a little bit Sensation of giving way: yes a couple times Locking: no Popping: yes Bruising: no Swelling: no Redness: no Paresthesias/decreased sensation: no Fevers: no   Relevant past medical, surgical, family and social history reviewed and updated as indicated. Interim medical history since our last visit reviewed. Allergies and medications reviewed and updated.  Review of Systems  Constitutional:  Negative for activity change, appetite change, diaphoresis, fatigue and fever.  Respiratory:  Negative for cough, chest tightness and shortness of breath.   Cardiovascular:  Negative for chest pain, palpitations and leg swelling.  Gastrointestinal: Negative.   Musculoskeletal:  Positive for arthralgias.  Neurological: Negative.   Psychiatric/Behavioral: Negative.      Per HPI unless specifically indicated above     Objective:    BP 118/68 (BP Location: Left Arm, Patient Position: Sitting, Cuff Size: Normal)   Pulse 81   Temp 98.6 F (37 C) (Oral)   Ht 5' 2.01" (1.575 m)   Wt 200 lb 3.2 oz (90.8 kg)   SpO2 95%   BMI 36.61 kg/m   Wt Readings from Last 3 Encounters:  09/23/22 200 lb 3.2 oz (90.8 kg)  09/20/22 200 lb 11.2 oz (91 kg)  06/20/22 191 lb 1.6 oz (86.7 kg)    Physical Exam Vitals and nursing note reviewed.  Constitutional:      General: She is awake. She is not in acute distress.    Appearance: She is well-developed and well-groomed. She is obese. She is not  ill-appearing or toxic-appearing.  HENT:     Head: Normocephalic.     Right Ear: Hearing normal.     Left Ear: Hearing normal.  Eyes:     General: Lids are normal.        Right eye: No discharge.        Left eye: No discharge.     Conjunctiva/sclera: Conjunctivae normal.     Pupils: Pupils are equal, round, and reactive to light.  Neck:     Thyroid: No thyromegaly.     Vascular: No carotid bruit.  Cardiovascular:     Rate and Rhythm: Normal rate and  regular rhythm.     Heart sounds: Normal heart sounds. No murmur heard.    No gallop.  Pulmonary:     Effort: Pulmonary effort is normal. No accessory muscle usage or respiratory distress.     Breath sounds: Normal breath sounds.  Abdominal:     General: Bowel sounds are normal.     Palpations: Abdomen is soft. There is no hepatomegaly or splenomegaly.  Musculoskeletal:     Cervical back: Normal range of motion and neck supple.     Lumbar back: Tenderness present. No swelling, spasms or bony tenderness. Decreased range of motion. Positive right straight leg raise test. Negative left straight leg raise test.     Right knee: Crepitus present. No swelling or bony tenderness. Decreased range of motion. Tenderness present over the medial joint line and lateral joint line. Normal alignment.     Instability Tests: Anterior drawer test negative. Posterior drawer test negative. Medial McMurray test negative and lateral McMurray test negative.     Left knee: Normal.     Right lower leg: No edema.     Left lower leg: No edema.  Skin:    General: Skin is warm and dry.  Neurological:     Mental Status: She is alert and oriented to person, place, and time.  Psychiatric:        Attention and Perception: Attention normal.        Mood and Affect: Mood normal.        Speech: Speech normal.        Behavior: Behavior normal. Behavior is cooperative.        Thought Content: Thought content normal.     Results for orders placed or performed in visit on 09/20/22  Vitamin B12  Result Value Ref Range   Vitamin B-12 293 180 - 914 pg/mL  Ferritin  Result Value Ref Range   Ferritin 21 11 - 307 ng/mL  Iron and TIBC  Result Value Ref Range   Iron 45 28 - 170 ug/dL   TIBC 512 (H) 250 - 450 ug/dL   Saturation Ratios 9 (L) 10.4 - 31.8 %   UIBC 467 ug/dL  CBC with Differential/Platelet  Result Value Ref Range   WBC 7.8 4.0 - 10.5 K/uL   RBC 4.76 3.87 - 5.11 MIL/uL   Hemoglobin 12.3 12.0 - 15.0 g/dL    HCT 39.8 36.0 - 46.0 %   MCV 83.6 80.0 - 100.0 fL   MCH 25.8 (L) 26.0 - 34.0 pg   MCHC 30.9 30.0 - 36.0 g/dL   RDW 17.7 (H) 11.5 - 15.5 %   Platelets 330 150 - 400 K/uL   nRBC 0.0 0.0 - 0.2 %   Neutrophils Relative % 65 %   Neutro Abs 5.0 1.7 - 7.7 K/uL   Lymphocytes Relative 21 %   Lymphs Abs 1.6 0.7 -  4.0 K/uL   Monocytes Relative 7 %   Monocytes Absolute 0.5 0.1 - 1.0 K/uL   Eosinophils Relative 6 %   Eosinophils Absolute 0.5 0.0 - 0.5 K/uL   Basophils Relative 1 %   Basophils Absolute 0.1 0.0 - 0.1 K/uL   Immature Granulocytes 0 %   Abs Immature Granulocytes 0.02 0.00 - 0.07 K/uL      Assessment & Plan:   Problem List Items Addressed This Visit       Nervous and Auditory   Chronic bilateral low back pain with sciatica - Primary    Chronic with current acute flare of pain with radiculopathy right side.  No red flags.  Will obtain new imaging, ordered.  Start Prednisone taper and sent in Lidocaine patches and Tizanidine for pain.  Recommend rest at home + alternate heat and ice.  Will plan on return in 3 weeks to reassess -- if ongoing will consider PT and ortho which she has done in past.      Relevant Medications   predniSONE (DELTASONE) 10 MG tablet   tiZANidine (ZANAFLEX) 4 MG tablet   Other Relevant Orders   DG Lumbar Spine Complete     Other   Acute pain of right knee    For [redacted] weeks along with right side lower back pain.  Will obtain imaging of right knee to further assess.  Start Lidocaine patches as needed + Prednisone taper.  Recommend alternate heat and ice at home + ensure rest.  Wear knee brace for comfort when walking. Plan for follow-up in 3 weeks and if ongoing will send to PT and ortho.      Relevant Orders   DG Knee Complete 4 Views Right     Follow up plan: Return in about 3 weeks (around 10/14/2022) for BACK AND KNEE PAIN.

## 2022-09-23 NOTE — Patient Instructions (Addendum)
IMAGING LOCATION: [address], [place], [place] [address] [phone]   Acute Back Pain, Adult Acute back pain is sudden and usually short-lived. It is often caused by an injury to the muscles and tissues in the back. The injury may result from: A muscle, tendon, or ligament getting overstretched or torn. Ligaments are tissues that connect bones to each other. Lifting something improperly can cause a back strain. Wear and tear (degeneration) of the spinal disks. Spinal disks are circular tissue that provide cushioning between the bones of the spine (vertebrae). Twisting motions, such as while playing sports or doing yard work. A hit to the back. Arthritis. You may have a physical exam, lab tests, and imaging tests to find the cause of your pain. Acute back pain usually goes away with rest and home care. Follow these instructions at home: Managing pain, stiffness, and swelling Take over-the-counter and prescription medicines only as told by your health care provider. Treatment may include medicines for pain and inflammation that are taken by mouth or applied to the skin, or muscle relaxants. Your health care provider may recommend applying ice during the first 24-48 hours after your pain starts. To do this: Put ice in a plastic bag. Place a towel between your skin and the bag. Leave the ice on for 20 minutes, 2-3 times a day. Remove the ice if your skin turns bright red. This is very important. If you cannot feel pain, heat, or cold, you have a greater risk of damage to the area. If directed, apply heat to the affected area as often as told by your health care provider. Use the heat source that your health care provider recommends, such as a moist heat pack or a heating pad. Place a towel between your skin and the heat source. Leave the heat on for 20-30 minutes. Remove the heat if your skin turns bright red. This is especially important if you are unable to feel pain, heat, or  cold. You have a greater risk of getting burned. Activity  Do not stay in bed. Staying in bed for more than 1-2 days can delay your recovery. Sit up and stand up straight. Avoid leaning forward when you sit or hunching over when you stand. If you work at a desk, sit close to it so you do not need to lean over. Keep your chin tucked in. Keep your neck drawn back, and keep your elbows bent at a 90-degree angle (right angle). Sit high and close to the steering wheel when you drive. Add lower back (lumbar) support to your car seat, if needed. Take short walks on even surfaces as soon as you are able. Try to increase the length of time you walk each day. Do not sit, drive, or stand in one place for more than 30 minutes at a time. Sitting or standing for long periods of time can put stress on your back. Do not drive or use heavy machinery while taking prescription pain medicine. Use proper lifting techniques. When you bend and lift, use positions that put less stress on your back: [place] your knees. Keep the load close to your body. Avoid twisting. Exercise regularly as told by your health care provider. Exercising helps your back heal faster and helps prevent back injuries by keeping muscles strong and flexible. Work with a physical therapist to make a safe exercise program, as recommended by your health care provider. Do any exercises as told by your physical therapist. Lifestyle Maintain a healthy weight.  Extra weight puts stress on your back and makes it difficult to have good posture. Avoid activities or situations that make you feel anxious or stressed. Stress and anxiety increase muscle tension and can make back pain worse. Learn ways to manage anxiety and stress, such as through exercise. General instructions Sleep on a firm mattress in a comfortable position. Try lying on your side with your knees slightly bent. If you lie on your back, put a pillow under your knees. Keep your head and neck in  a straight line with your spine (neutral position) when using electronic equipment like smartphones or pads. To do this: Raise your smartphone or pad to look at it instead of bending your head or neck to look down. Put the smartphone or pad at the level of your face while looking at the screen. Follow your treatment plan as told by your health care provider. This may include: Cognitive or behavioral therapy. Acupuncture or massage therapy. Meditation or yoga. Contact a health care provider if: You have pain that is not relieved with rest or medicine. You have increasing pain going down into your legs or buttocks. Your pain does not improve after 2 weeks. You have pain at night. You lose weight without trying. You have a fever or chills. You develop nausea or vomiting. You develop abdominal pain. Get help right away if: You develop new bowel or bladder control problems. You have unusual weakness or numbness in your arms or legs. You feel faint. These symptoms may represent a serious problem that is an emergency. Do not wait to see if the symptoms will go away. Get medical help right away. Call your local emergency services (911 in the [place] Do not drive yourself to the hospital. Summary Acute back pain is sudden and usually short-lived. Use proper lifting techniques. When you bend and lift, use positions that put less stress on your back. Take over-the-counter and prescription medicines only as told by your health care provider, and apply heat or ice as told. This information is not intended to replace advice given to you by your health care provider. Make sure you discuss any questions you have with your health care provider. Document Revised: 01/15/2021 Document Reviewed: 01/15/2021 Elsevier Patient Education  [place].

## 2022-09-23 NOTE — Assessment & Plan Note (Signed)
For [redacted] weeks along with right side lower back pain.  Will obtain imaging of right knee to further assess.  Start Lidocaine patches as needed + Prednisone taper.  Recommend alternate heat and ice at home + ensure rest.  Wear knee brace for comfort when walking. Plan for follow-up in 3 weeks and if ongoing will send to PT and ortho.

## 2022-09-23 NOTE — Assessment & Plan Note (Signed)
Chronic with current acute flare of pain with radiculopathy right side.  No red flags.  Will obtain new imaging, ordered.  Start Prednisone taper and sent in Lidocaine patches and Tizanidine for pain.  Recommend rest at home + alternate heat and ice.  Will plan on return in 3 weeks to reassess -- if ongoing will consider PT and ortho which she has done in past.

## 2022-09-26 ENCOUNTER — Telehealth: Payer: Self-pay

## 2022-09-26 ENCOUNTER — Ambulatory Visit
Admission: RE | Admit: 2022-09-26 | Discharge: 2022-09-26 | Disposition: A | Discharge status: Home / Self Care | Payer: Medicare (Managed Care) | Source: Ambulatory Visit | Attending: Nurse Practitioner | Admitting: Nurse Practitioner

## 2022-09-26 ENCOUNTER — Ambulatory Visit
Admission: RE | Admit: 2022-09-26 | Discharge: 2022-09-26 | Disposition: A | Discharge status: Home / Self Care | Payer: Medicare (Managed Care) | Attending: Nurse Practitioner | Admitting: Nurse Practitioner

## 2022-09-26 DIAGNOSIS — M5441 Lumbago with sciatica, right side: Secondary | ICD-10-CM | POA: Insufficient documentation

## 2022-09-26 DIAGNOSIS — G8929 Other chronic pain: Secondary | ICD-10-CM | POA: Insufficient documentation

## 2022-09-26 DIAGNOSIS — M25561 Pain in right knee: Secondary | ICD-10-CM | POA: Diagnosis present

## 2022-09-26 NOTE — Telephone Encounter (Signed)
PA started for Lidocaine patches 5%  through [employer]. Awaiting on determination

## 2022-09-26 NOTE — Telephone Encounter (Signed)
PA for Lidocaine patches 5% through [employer]. Has been approved through 09/26/2023

## 2022-09-28 NOTE — Progress Notes (Signed)
Contacted via [place] afternoon Yannely, your do have moderate arthritis changes to lower back and mild to right knee.  Suspect this is caused of pain ongoing.  Continue current treatment regimen as ordered and if ongoing we will plan on getting you into physical therapy and orthopedics.  Any questions? Keep being amazing!!  Thank you for allowing me to participate in your care.  I appreciate you. Kindest regards, Ernestine Rohman

## 2022-10-16 NOTE — Patient Instructions (Signed)
Acute Back Pain, Adult Acute back pain is sudden and usually short-lived. It is often caused by an injury to the muscles and tissues in the back. The injury may result from: A muscle, tendon, or ligament getting overstretched or torn. Ligaments are tissues that connect bones to each other. Lifting something improperly can cause a back strain. Wear and tear (degeneration) of the spinal disks. Spinal disks are circular tissue that provide cushioning between the bones of the spine (vertebrae). Twisting motions, such as while playing sports or doing yard work. A hit to the back. Arthritis. You may have a physical exam, lab tests, and imaging tests to find the cause of your pain. Acute back pain usually goes away with rest and home care. Follow these instructions at home: Managing pain, stiffness, and swelling Take over-the-counter and prescription medicines only as told by your health care provider. Treatment may include medicines for pain and inflammation that are taken by mouth or applied to the skin, or muscle relaxants. Your health care provider may recommend applying ice during the first 24-48 hours after your pain starts. To do this: Put ice in a plastic bag. Place a towel between your skin and the bag. Leave the ice on for 20 minutes, 2-3 times a day. Remove the ice if your skin turns bright red. This is very important. If you cannot feel pain, heat, or cold, you have a greater risk of damage to the area. If directed, apply heat to the affected area as often as told by your health care provider. Use the heat source that your health care provider recommends, such as a moist heat pack or a heating pad. Place a towel between your skin and the heat source. Leave the heat on for 20-30 minutes. Remove the heat if your skin turns bright red. This is especially important if you are unable to feel pain, heat, or cold. You have a greater risk of getting burned. Activity  Do not stay in bed. Staying in  bed for more than 1-2 days can delay your recovery. Sit up and stand up straight. Avoid leaning forward when you sit or hunching over when you stand. If you work at a desk, sit close to it so you do not need to lean over. Keep your chin tucked in. Keep your neck drawn back, and keep your elbows bent at a 90-degree angle (right angle). Sit high and close to the steering wheel when you drive. Add lower back (lumbar) support to your car seat, if needed. Take short walks on even surfaces as soon as you are able. Try to increase the length of time you walk each day. Do not sit, drive, or stand in one place for more than 30 minutes at a time. Sitting or standing for long periods of time can put stress on your back. Do not drive or use heavy machinery while taking prescription pain medicine. Use proper lifting techniques. When you bend and lift, use positions that put less stress on your back: Bend your knees. Keep the load close to your body. Avoid twisting. Exercise regularly as told by your health care provider. Exercising helps your back heal faster and helps prevent back injuries by keeping muscles strong and flexible. Work with a physical therapist to make a safe exercise program, as recommended by your health care provider. Do any exercises as told by your physical therapist. Lifestyle Maintain a healthy weight. Extra weight puts stress on your back and makes it difficult to have good   posture. Avoid activities or situations that make you feel anxious or stressed. Stress and anxiety increase muscle tension and can make back pain worse. Learn ways to manage anxiety and stress, such as through exercise. General instructions Sleep on a firm mattress in a comfortable position. Try lying on your side with your knees slightly bent. If you lie on your back, put a pillow under your knees. Keep your head and neck in a straight line with your spine (neutral position) when using electronic equipment like  smartphones or pads. To do this: Raise your smartphone or pad to look at it instead of bending your head or neck to look down. Put the smartphone or pad at the level of your face while looking at the screen. Follow your treatment plan as told by your health care provider. This may include: Cognitive or behavioral therapy. Acupuncture or massage therapy. Meditation or yoga. Contact a health care provider if: You have pain that is not relieved with rest or medicine. You have increasing pain going down into your legs or buttocks. Your pain does not improve after 2 weeks. You have pain at night. You lose weight without trying. You have a fever or chills. You develop nausea or vomiting. You develop abdominal pain. Get help right away if: You develop new bowel or bladder control problems. You have unusual weakness or numbness in your arms or legs. You feel faint. These symptoms may represent a serious problem that is an emergency. Do not wait to see if the symptoms will go away. Get medical help right away. Call your local emergency services (911 in the [place] Do not drive yourself to the hospital. Summary Acute back pain is sudden and usually short-lived. Use proper lifting techniques. When you bend and lift, use positions that put less stress on your back. Take over-the-counter and prescription medicines only as told by your health care provider, and apply heat or ice as told. This information is not intended to replace advice given to you by your health care provider. Make sure you discuss any questions you have with your health care provider. Document Revised: 01/15/2021 Document Reviewed: 01/15/2021 Elsevier Patient Education  2023 [employer] Inc.  

## 2022-10-18 ENCOUNTER — Ambulatory Visit: Payer: Medicare (Managed Care) | Admitting: Nurse Practitioner

## 2022-10-18 ENCOUNTER — Ambulatory Visit (INDEPENDENT_AMBULATORY_CARE_PROVIDER_SITE_OTHER): Payer: Medicare (Managed Care) | Admitting: Nurse Practitioner

## 2022-10-18 ENCOUNTER — Encounter: Payer: Self-pay | Admitting: Nurse Practitioner

## 2022-10-18 VITALS — BP 132/70 | HR 86 | Temp 98.6°F | Wt 196.5 lb

## 2022-10-18 DIAGNOSIS — G8929 Other chronic pain: Secondary | ICD-10-CM

## 2022-10-18 DIAGNOSIS — M25561 Pain in right knee: Secondary | ICD-10-CM

## 2022-10-18 DIAGNOSIS — M5441 Lumbago with sciatica, right side: Secondary | ICD-10-CM

## 2022-10-18 MED ORDER — GABAPENTIN 300 MG PO CAPS: 300.0000 mg | ORAL_CAPSULE | Freq: Every day | ORAL | 5 refills | Status: DC

## 2022-10-18 NOTE — Assessment & Plan Note (Signed)
Chronic, ongoing.  At this time will trial a small dose of Gabapentin at night for ongoing sciatic discomfort and sleep pattern.  Recommend avoid Ibuprofen products.  May take Tylenol as needed.  Continue collaboration with ortho.

## 2022-10-18 NOTE — Progress Notes (Signed)
BP 132/70 (BP Location: Left Arm, Patient Position: Sitting, Cuff Size: Normal)   Pulse 86   Temp 98.6 F (37 C) (Oral)   Wt 196 lb 8 oz (89.1 kg)   SpO2 98%   BMI 35.93 kg/m    Subjective:    Patient ID: Mary Irwin, female    DOB: August 26, 1951, 71 y.o.   MRN: [id number]  HPI: Mary Irwin is a 71 y.o. female  Chief Complaint  Patient presents with   Back Pain   Knee Pain   BACK PAIN Follow-up today for back and knee pain -- treated with Prednisone 09/23/22.  Was helping her mother repot flowers prior to discomfort.  Imaging performed on 09/26/22 with moderate multilevel lumbar spondylosis.    Last MRI lumbar spine was October 2021 noting multilevel degenerative changes with mild left neural foraminal narrowing L2-L3 Duration: weeks Mechanism of injury: unknown Location: bilateral and low back Onset: sudden Severity: 6/10 Quality: dull, aching, and throbbing Frequency: intermittent Radiation: R leg below the knee Aggravating factors: lifting, movement, walking, bending, and prolonged sitting Alleviating factors:  Lanocaine + Biofreeze  + Tizanidine Status: fluctuating Treatments attempted: as above + Prednisone taper, Lidocaine patches Relief with NSAIDs?: No NSAIDs Taken Nighttime pain:  no -- sleeps on sides Paresthesias / decreased sensation:  no Bowel / bladder incontinence:  no Fevers:  no Dysuria / urinary frequency:  no   KNEE PAIN Follow-up for knee pain. Was helping her mother repot flowers prior to discomfort.  Was bending up and down a lot + kneeling. 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  She reports on Thanksgiving Day could not walk on right leg, when she put weight on right foot pain was terrible.    Saw [facility] on Monday morning -- had a cortisone shot + was provided muscle relaxer + knee brace.  Discussed with her possible need for replacement in a couple years.  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  Is doing  exercises at home.   Duration: weeks Involved knee: right Mechanism of injury: unknown Location: lateral Onset: sudden Severity: 2/10 -- injection helped somewhat Quality:  dull, aching, and throbbing Frequency: intermittent Radiation: no Aggravating factors: weight bearing, walking, movement, and prolonged sitting  Alleviating factors: rest & Biofreeze Status: stable Treatments attempted: as above + knee brace, Lidocaine patches Relief with NSAIDs?:  No NSAIDs Taken Weakness with weight bearing or walking: a little bit Sensation of giving way: yes a couple times Locking: yes Popping: yes Bruising: no Swelling: no Redness: no Paresthesias/decreased sensation: no Fevers: no   Relevant past medical, surgical, family and social history reviewed and updated as indicated. Interim medical history since our last visit reviewed. Allergies and medications reviewed and updated.  Review of Systems  Constitutional:  Negative for activity change, appetite change, diaphoresis, fatigue and fever.  Respiratory:  Negative for cough, chest tightness and shortness of breath.   Cardiovascular:  Negative for chest pain, palpitations and leg swelling.  Gastrointestinal: Negative.   Musculoskeletal:  Positive for arthralgias.  Neurological: Negative.   Psychiatric/Behavioral: Negative.      Per HPI unless specifically indicated above     Objective:    BP 132/70 (BP Location: Left Arm, Patient Position: Sitting, Cuff Size: Normal)   Pulse 86   Temp 98.6 F (37 C) (Oral)   Wt 196 lb 8 oz (89.1 kg)   SpO2 98%   BMI 35.93 kg/m   Wt Readings  from Last 3 Encounters:  10/18/22 196 lb 8 oz (89.1 kg)  09/23/22 200 lb 3.2 oz (90.8 kg)  09/20/22 200 lb 11.2 oz (91 kg)    Physical Exam Vitals and nursing note reviewed.  Constitutional:      General: She is awake. She is not in acute distress.    Appearance: She is well-developed and well-groomed. She is obese. She is not ill-appearing or  toxic-appearing.  HENT:     Head: Normocephalic.     Right Ear: Hearing normal.     Left Ear: Hearing normal.  Eyes:     General: Lids are normal.        Right eye: No discharge.        Left eye: No discharge.     Conjunctiva/sclera: Conjunctivae normal.     Pupils: Pupils are equal, round, and reactive to light.  Neck:     Thyroid: No thyromegaly.     Vascular: No carotid bruit.  Cardiovascular:     Rate and Rhythm: Normal rate and regular rhythm.     Heart sounds: Normal heart sounds. No murmur heard.    No gallop.  Pulmonary:     Effort: Pulmonary effort is normal. No accessory muscle usage or respiratory distress.     Breath sounds: Normal breath sounds.  Abdominal:     General: Bowel sounds are normal.     Palpations: Abdomen is soft. There is no hepatomegaly or splenomegaly.  Musculoskeletal:     Cervical back: Normal range of motion and neck supple.     Lumbar back: Tenderness present. No swelling, spasms or bony tenderness. Decreased range of motion. Positive right straight leg raise test. Negative left straight leg raise test.     Right knee: Crepitus present. No swelling or bony tenderness. Decreased range of motion. Tenderness present over the medial joint line and lateral joint line. Normal alignment.     Instability Tests: Anterior drawer test negative. Posterior drawer test negative. Medial McMurray test negative and lateral McMurray test negative.     Left knee: Normal.     Right lower leg: No edema.     Left lower leg: No edema.  Skin:    General: Skin is warm and dry.  Neurological:     Mental Status: She is alert and oriented to person, place, and time.  Psychiatric:        Attention and Perception: Attention normal.        Mood and Affect: Mood normal.        Speech: Speech normal.        Behavior: Behavior normal. Behavior is cooperative.        Thought Content: Thought content normal.    Results for orders placed or performed in visit on 09/20/22   Vitamin B12  Result Value Ref Range   Vitamin B-12 293 180 - 914 pg/mL  Ferritin  Result Value Ref Range   Ferritin 21 11 - 307 ng/mL  Iron and TIBC  Result Value Ref Range   Iron 45 28 - 170 ug/dL   TIBC 512 (H) 250 - 450 ug/dL   Saturation Ratios 9 (L) 10.4 - 31.8 %   UIBC 467 ug/dL  CBC with Differential/Platelet  Result Value Ref Range   WBC 7.8 4.0 - 10.5 K/uL   RBC 4.76 3.87 - 5.11 MIL/uL   Hemoglobin 12.3 12.0 - 15.0 g/dL   HCT 39.8 36.0 - 46.0 %   MCV 83.6 80.0 - 100.0 fL  MCH 25.8 (L) 26.0 - 34.0 pg   MCHC 30.9 30.0 - 36.0 g/dL   RDW 17.7 (H) 11.5 - 15.5 %   Platelets 330 150 - 400 K/uL   nRBC 0.0 0.0 - 0.2 %   Neutrophils Relative % 65 %   Neutro Abs 5.0 1.7 - 7.7 K/uL   Lymphocytes Relative 21 %   Lymphs Abs 1.6 0.7 - 4.0 K/uL   Monocytes Relative 7 %   Monocytes Absolute 0.5 0.1 - 1.0 K/uL   Eosinophils Relative 6 %   Eosinophils Absolute 0.5 0.0 - 0.5 K/uL   Basophils Relative 1 %   Basophils Absolute 0.1 0.0 - 0.1 K/uL   Immature Granulocytes 0 %   Abs Immature Granulocytes 0.02 0.00 - 0.07 K/uL      Assessment & Plan:   Problem List Items Addressed This Visit       Nervous and Auditory   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  At this time will trial a small dose of Gabapentin at night for ongoing sciatic discomfort and sleep pattern.  Recommend avoid Ibuprofen products.  May take Tylenol as needed.  Continue collaboration with ortho.      Relevant Medications   gabapentin (NEURONTIN) 300 MG capsule     Other   Acute pain of right knee    Chronic, ongoing, saw orthopedics.  Recommend alternate heat and ice at home + ensure rest.  Wear knee brace for comfort when walking.  May benefit from PT in future.  Recommend continue Tylenol as needed + Biofreeze and Tizanidine.  Continue collaboration with ortho.        Follow up plan: Return for as scheduled 12/21/22.

## 2022-10-18 NOTE — Assessment & Plan Note (Signed)
Chronic, ongoing, saw orthopedics.  Recommend alternate heat and ice at home + ensure rest.  Wear knee brace for comfort when walking.  May benefit from PT in future.  Recommend continue Tylenol as needed + Biofreeze and Tizanidine.  Continue collaboration with ortho.

## 2022-10-20 ENCOUNTER — Inpatient Hospital Stay: Payer: Medicare (Managed Care)

## 2022-10-20 ENCOUNTER — Inpatient Hospital Stay: Payer: Medicare (Managed Care) | Attending: Internal Medicine

## 2022-10-20 VITALS — BP 163/68 | HR 92 | Temp 98.7°F

## 2022-10-20 DIAGNOSIS — E538 Deficiency of other specified B group vitamins: Secondary | ICD-10-CM | POA: Diagnosis not present

## 2022-10-20 DIAGNOSIS — D509 Iron deficiency anemia, unspecified: Secondary | ICD-10-CM | POA: Diagnosis present

## 2022-10-20 MED ORDER — SODIUM CHLORIDE 0.9 % IV SOLN
200.0000 mg | Freq: Once | INTRAVENOUS | Status: AC
  Administered 2022-10-20: 200 mg via INTRAVENOUS
  Filled 2022-10-20: qty 200

## 2022-10-20 MED ORDER — CYANOCOBALAMIN 1000 MCG/ML IJ SOLN
1000.0000 ug | Freq: Once | INTRAMUSCULAR | Status: AC
  Administered 2022-10-20: 1000 ug via INTRAMUSCULAR
  Filled 2022-10-20: qty 1

## 2022-10-20 MED ORDER — SODIUM CHLORIDE 0.9 % IV SOLN
Freq: Once | INTRAVENOUS | Status: AC
  Filled 2022-10-20: qty 250

## 2022-11-03 ENCOUNTER — Encounter: Payer: Self-pay | Admitting: Physician Assistant

## 2022-11-03 ENCOUNTER — Ambulatory Visit: Payer: Self-pay

## 2022-11-03 ENCOUNTER — Ambulatory Visit (INDEPENDENT_AMBULATORY_CARE_PROVIDER_SITE_OTHER): Payer: Medicare (Managed Care) | Admitting: Physician Assistant

## 2022-11-03 VITALS — BP 145/73 | HR 88 | Temp 99.2°F | Wt 200.4 lb

## 2022-11-03 DIAGNOSIS — R21 Rash and other nonspecific skin eruption: Secondary | ICD-10-CM

## 2022-11-03 DIAGNOSIS — T7840XA Allergy, unspecified, initial encounter: Secondary | ICD-10-CM | POA: Diagnosis not present

## 2022-11-03 MED ORDER — METHYLPREDNISOLONE SODIUM SUCC 40 MG IJ SOLR
60.0000 mg | Freq: Once | INTRAMUSCULAR | Status: AC
  Administered 2022-11-03: 60 mg via INTRAMUSCULAR

## 2022-11-03 MED ORDER — PREDNISONE 20 MG PO TABS: ORAL_TABLET | ORAL | 0 refills | Status: DC

## 2022-11-03 NOTE — Progress Notes (Signed)
Acute Office Visit   Patient: Mary Irwin   DOB: 1951-08-02   71 y.o. Female  MRN: [id number] Visit Date: 11/03/2022  Today's healthcare provider: Dani Gobble Tallon Gertz, PA-C  Introduced myself to the patient as a Journalist, newspaper and provided education on APPs in clinical practice.    Chief Complaint  Patient presents with   Rash    Pt states she has a rash pretty much all over that she first noticed Tuesday. States the rash occasionally itches and is spreading. States she thinks it has come from Gabapentin that she was recently put on. States she took the last dose of this on Monday. States she has taken Benadryl which has not really seemed to help.    Subjective    Rash Pertinent negatives include no shortness of breath.   HPI     Rash    Additional comments: Pt states she has a rash pretty much all over that she first noticed Tuesday. States the rash occasionally itches and is spreading. States she thinks it has come from Gabapentin that she was recently put on. States she took the last dose of this on Monday. States she has taken Benadryl which has not really seemed to help.       Last edited by Georgina Peer, CMA on 11/03/2022 10:27 AM.      Concern for Rash  Onset: gradually worsening  Duration: Started on Tuesday States her only new addition to her regimen has been Gabapentin and she has not taken this since Tuesday  Reports she has tried taking benadryl to assist with rash but this is not helping much Reports rash is itching  States feet and legs feel like they have a sunburn on them  She denies blisters or pustules, vesicles,   She denies SOB or wheezing but feels like her lips are swollen  Reports the lip swelling seemed to start today   She denies new soaps, detergents, lotions, or hair products, new foods     Medications: Outpatient Medications Prior to Visit  Medication Sig Note   albuterol (PROVENTIL HFA;VENTOLIN HFA) 108 (90 Base) MCG/ACT inhaler Inhale  2 puffs into the lungs every 6 (six) hours as needed for wheezing or shortness of breath.    amitriptyline (ELAVIL) 25 MG tablet Take 25 mg by mouth at bedtime.    apixaban (ELIQUIS) 5 MG TABS tablet Take 5 mg by mouth 2 (two) times daily. Am and bedtime    BREO ELLIPTA 200-25 MCG/ACT AEPB INHALE 1 PUFF INTO THE LUNGS DAILY    Cholecalciferol (VITAMIN D3) 1.25 MG (50000 UT) CAPS TAKE 1 CAPSULE BY MOUTH 1 TIME A WEEK    diltiazem (CARDIZEM CD) 300 MG 24 hr capsule Take 300 mg by mouth daily.    hydrochlorothiazide (HYDRODIURIL) 25 MG tablet Take 25 mg by mouth daily. am    Hyoscyamine Sulfate SL 0.125 MG SUBL Place 0.125 mg under the tongue every 6 (six) hours as needed. Place 1 tablet (0.125 mg total) under the tongue every 6 (six) hours as needed for Cramping    LINZESS 72 MCG capsule Take 72 mcg by mouth every morning.    ondansetron (ZOFRAN-ODT) 4 MG disintegrating tablet Take 4 mg by mouth every 8 (eight) hours as needed for nausea or vomiting.    pantoprazole (PROTONIX) 40 MG tablet Take 40 mg by mouth 2 (two) times daily. Am and bedtime    rosuvastatin (CRESTOR) 10 MG tablet TAKE  1 TABLET(10 MG) BY MOUTH DAILY    tiZANidine (ZANAFLEX) 4 MG tablet Take 1 tablet (4 mg total) by mouth every 6 (six) hours as needed for muscle spasms.    [DISCONTINUED] gabapentin (NEURONTIN) 300 MG capsule Take 1 capsule (300 mg total) by mouth at bedtime. (Patient not taking: Reported on 11/03/2022) 11/03/2022: potential allergic reaction- skin rash and lip swelling   No facility-administered medications prior to visit.    Review of Systems  HENT:  Negative for drooling and trouble swallowing.   Respiratory:  Negative for shortness of breath and wheezing.   Skin:  Positive for rash.       Objective    BP (!) 145/73 (BP Location: Left Arm, Cuff Size: Normal)   Pulse 88   Temp 99.2 F (37.3 C) (Oral)   Wt 200 lb 6.4 oz (90.9 kg)   SpO2 96%   BMI 36.64 kg/m    Physical Exam Vitals reviewed.   Constitutional:      General: She is awake.     Appearance: Normal appearance. She is well-developed and well-groomed.  HENT:     Mouth/Throat:     Lips: Pink. No lesions.     Mouth: Mucous membranes are moist. No injury or oral lesions.     Pharynx: Oropharynx is clear. Uvula midline. No oropharyngeal exudate or posterior oropharyngeal erythema.  Pulmonary:     Effort: Pulmonary effort is normal.     Breath sounds: Normal breath sounds and air entry. No decreased air movement. No decreased breath sounds, wheezing, rhonchi or rales.  Musculoskeletal:     Right lower leg: 2+ Edema present.     Left lower leg: 2+ Edema present.  Skin:    General: Skin is warm.     Findings: Rash present. Rash is macular, papular and vesicular.     Comments: Disseminated maculopapular rash along arms, hands, and lower legs Vesicles present along dorsal aspect of feet and ankles Mild spread of maculopapular lesions along chest and back   Neurological:     Mental Status: She is alert.  Psychiatric:        Behavior: Behavior is cooperative.       No results found for any visits on 11/03/22.  Assessment & Plan      No follow-ups on file.       Problem List Items Addressed This Visit       Musculoskeletal and Integument   Rash due to allergy - Primary    Acute, new concern Reports she first noticed rash on Tuesday and states it has been steadily spreading to cover more areas of her body since then She also reports lip swelling and tingling that started today.  She has tried benadryl but this has not provided much benefit She reports the only changes to her daily routine are that she started Gabapentin  Recommend she stop this immediately  Will provide 60 mg IM injection of Solumedrol today along with prednisone taper for her to take at home Reviewed ED and return precautions with her - she voiced understanding and agreement Follow up as needed for persistent or progressing symptoms        Relevant Medications   predniSONE (DELTASONE) 20 MG tablet     No follow-ups on file.   I, Mari Battaglia E Jeanluc Wegman, PA-C, have reviewed all documentation for this visit. The documentation on 11/03/22 for the exam, diagnosis, procedures, and orders are all accurate and complete.   Talitha Givens, MHS, PA-C [facility]

## 2022-11-03 NOTE — Assessment & Plan Note (Signed)
Acute, new concern Reports she first noticed rash on Tuesday and states it has been steadily spreading to cover more areas of her body since then She also reports lip swelling and tingling that started today.  She has tried benadryl but this has not provided much benefit She reports the only changes to her daily routine are that she started Gabapentin  Recommend she stop this immediately  Will provide 60 mg IM injection of Solumedrol today along with prednisone taper for her to take at home Reviewed ED and return precautions with her - she voiced understanding and agreement Follow up as needed for persistent or progressing symptoms

## 2022-11-03 NOTE — Telephone Encounter (Signed)
  Chief Complaint: rash, lips swollen  Symptoms: red painful feet and ankles- red raised rash with raised spots feet, hands, neck, chest, arms, legs, back  Frequency: Tuesday Pertinent Negatives: Patient denies SOB, difficulty swallowing Disposition: '[]'$ ED /'[]'$ Urgent Care (no appt availability in office) / '[x]'$ Appointment(In office/virtual)/ '[]'$  [facility]/ '[]'$ Home Care/ '[]'$ Refused Recommended Disposition /'[]'$ [facility]/ '[]'$  Follow-up with PCP Additional Notes: appt at 1020 in office Reason for Disposition  Face becomes swollen    Lips swollen  Answer Assessment - Initial Assessment Questions 1. APPEARANCE of RASH: "Describe the rash." (e.g., spots, blisters, raised areas, skin peeling, scaly)     Red to ankle raised spots on hands- feet look all red  2. SIZE: "How big are the spots?" (e.g., tip of pen, eraser, coin; inches, centimeters)     - 3. LOCATION: "Where is the rash located?"     Feet, hands, neck, chest, arms, legs, ? Back  4. COLOR: "What color is the rash?" (Note: It is difficult to assess rash color in people with darker-colored skin. When this situation occurs, simply ask the caller to describe what they see.)     Feet and ankle red 5. ONSET: "When did the rash begin?"     Tuesday 6. FEVER: "Do you have a fever?" If Yes, ask: "What is your temperature, how was it measured, and when did it start?"     no 7. ITCHING: "Does the rash itch?" If Yes, ask: "How bad is the itch?" (Scale 1-10; or mild, moderate, severe)     yes 8. CAUSE: "What do you think is causing the rash?"     Gabapentin, 1 dose of muscle relaxer 9. MEDICINE FACTORS: "Have you started any new medicines within the last 2 weeks?" (e.g., antibiotics)      Gabapentin 10. OTHER SYMPTOMS: "Do you have any other symptoms?" (e.g., dizziness, headache, sore throat, joint pain)       Feet swollen solid red pain with walking 6/10, lips swollen 11. PREGNANCY: "Is there any chance you are pregnant?"  "When was your last menstrual period?"       N/a  Answer Assessment - Initial Assessment Questions 1. ONSET: "When did the swelling start?" (e.g., minutes, hours, days)    This am  2. SEVERITY: "How swollen is it?"     Woke up with lips swollen pt stated lip swelling has improved 3. ITCHING: "Is there any itching?" If Yes, ask: "How much?"   (Scale 1-10; mild, moderate or severe)     moderate 4. PAIN: "Is the swelling painful to touch?" If Yes, ask: "How painful is it?"   (Scale 1-10; mild, moderate or severe)     no 5. CAUSE: "What do you think is causing the lip swelling?"     gabapentin 6. RECURRENT SYMPTOM: "Have you had lip swelling before?" If Yes, ask: "When was the last time?" "What happened that time?"     N.a 7. OTHER SYMPTOMS: "Do you have any other symptoms?" (e.g., toothache)     Rash and red painful feet 8. PREGNANCY: "Is there any chance you are pregnant?" "When was your last menstrual period?"     N/a  Protocols used: Rash or Redness - Widespread-A-AH, Lip Swelling-A-AH

## 2022-11-18 MED FILL — Iron Sucrose Inj 20 MG/ML (Fe Equiv): INTRAVENOUS | Qty: 10 | Status: AC

## 2022-11-21 ENCOUNTER — Inpatient Hospital Stay: Payer: Medicare (Managed Care) | Attending: Internal Medicine

## 2022-11-21 ENCOUNTER — Inpatient Hospital Stay: Payer: Medicare (Managed Care)

## 2022-11-21 VITALS — BP 148/2 | HR 72 | Temp 97.2°F | Resp 18

## 2022-11-21 DIAGNOSIS — E538 Deficiency of other specified B group vitamins: Secondary | ICD-10-CM | POA: Diagnosis present

## 2022-11-21 DIAGNOSIS — D509 Iron deficiency anemia, unspecified: Secondary | ICD-10-CM

## 2022-11-21 MED ORDER — SODIUM CHLORIDE 0.9 % IV SOLN
Freq: Once | INTRAVENOUS | Status: AC
  Filled 2022-11-21: qty 250

## 2022-11-21 MED ORDER — SODIUM CHLORIDE 0.9 % IV SOLN
200.0000 mg | Freq: Once | INTRAVENOUS | Status: AC
  Administered 2022-11-21: 200 mg via INTRAVENOUS
  Filled 2022-11-21: qty 200

## 2022-11-21 MED ORDER — CYANOCOBALAMIN 1000 MCG/ML IJ SOLN
1000.0000 ug | Freq: Once | INTRAMUSCULAR | Status: AC
  Administered 2022-11-21: 1000 ug via INTRAMUSCULAR
  Filled 2022-11-21: qty 1

## 2022-11-21 NOTE — Patient Instructions (Addendum)
Iron Sucrose Injection What is this medication? IRON SUCROSE (EYE ern SOO krose) treats low levels of iron (iron deficiency anemia) in people with kidney disease. Iron is a mineral that plays an important role in making red blood cells, which carry oxygen from your lungs to the rest of your body. This medicine may be used for other purposes; ask your health care provider or pharmacist if you have questions. COMMON BRAND NAME(S): Venofer What should I tell my care team before I take this medication? They need to know if you have any of these conditions: Anemia not caused by low iron levels Heart disease High levels of iron in the blood Kidney disease Liver disease An unusual or allergic reaction to iron, other medications, foods, dyes, or preservatives Pregnant or trying to get pregnant Breastfeeding How should I use this medication? This medication is for infusion into a vein. It is given in a hospital or clinic setting. Talk to your care team about the use of this medication in children. While this medication may be prescribed for children as young as 2 years for selected conditions, precautions do apply. Overdosage: If you think you have taken too much of this medicine contact a poison control center or emergency room at once. NOTE: This medicine is only for you. Do not share this medicine with others. What if I miss a dose? Keep appointments for follow-up doses. It is important not to miss your dose. Call your care team if you are unable to keep an appointment. What may interact with this medication? Do not take this medication with any of the following: Deferoxamine Dimercaprol Other iron products This medication may also interact with the following: Chloramphenicol Deferasirox This list may not describe all possible interactions. Give your health care provider a list of all the medicines, herbs, non-prescription drugs, or dietary supplements you use. Also tell them if you smoke,  drink alcohol, or use illegal drugs. Some items may interact with your medicine. What should I watch for while using this medication? Visit your care team regularly. Tell your care team if your symptoms do not start to get better or if they get worse. You may need blood work done while you are taking this medication. You may need to follow a special diet. Talk to your care team. Foods that contain iron include: whole grains/cereals, dried fruits, beans, or peas, leafy green vegetables, and organ meats (liver, kidney). What side effects may I notice from receiving this medication? Side effects that you should report to your care team as soon as possible: Allergic reactions--skin rash, itching, hives, swelling of the face, lips, tongue, or throat Low blood pressure--dizziness, feeling faint or lightheaded, blurry vision Shortness of breath Side effects that usually do not require medical attention (report to your care team if they continue or are bothersome): Flushing Headache Joint painVitamin B12 Injection What is this medication? Vitamin B12 (VAHY tuh min B12) prevents and treats low vitamin B12 levels in your body. It is used in people who do not get enough vitamin B12 from their diet or when their digestive tract does not absorb enough. Vitamin B12 plays an important role in maintaining the health of your nervous system and red blood cells. This medicine may be used for other purposes; ask your health care provider or pharmacist if you have questions. COMMON BRAND NAME(S): B-12 Compliance Kit, B-12 Injection Kit, Cyomin, Dodex, LA-12, Nutri-Twelve, Physicians EZ Use B-12, Primabalt What should I tell my care team before I take this  medication? They need to know if you have any of these conditions: Kidney disease Leber's disease Megaloblastic anemia An unusual or allergic reaction to cyanocobalamin, cobalt, other medications, foods, dyes, or preservatives Pregnant or trying to get  pregnant Breast-feeding How should I use this medication? This medication is injected into a muscle or deeply under the skin. It is usually given in a clinic or care team's office. However, your care team may teach you how to inject yourself. Follow all instructions. Talk to your care team about the use of this medication in children. Special care may be needed. Overdosage: If you think you have taken too much of this medicine contact a poison control center or emergency room at once. NOTE: This medicine is only for you. Do not share this medicine with others. What if I miss a dose? If you are given your dose at a clinic or care team's office, call to reschedule your appointment. If you give your own injections, and you miss a dose, take it as soon as you can. If it is almost time for your next dose, take only that dose. Do not take double or extra doses. What may interact with this medication? Alcohol Colchicine This list may not describe all possible interactions. Give your health care provider a list of all the medicines, herbs, non-prescription drugs, or dietary supplements you use. Also tell them if you smoke, drink alcohol, or use illegal drugs. Some items may interact with your medicine. What should I watch for while using this medication? Visit your care team regularly. You may need blood work done while you are taking this medication. You may need to follow a special diet. Talk to your care team. Limit your alcohol intake and avoid smoking to get the best benefit. What side effects may I notice from receiving this medication? Side effects that you should report to your care team as soon as possible: Allergic reactions--skin rash, itching, hives, swelling of the face, lips, tongue, or throat Swelling of the ankles, hands, or feet Trouble breathing Side effects that usually do not require medical attention (report to your care team if they continue or are bothersome): Diarrhea This list  may not describe all possible side effects. Call your doctor for medical advice about side effects. You may report side effects to FDA at 1-800-FDA-1088. Where should I keep my medication? Keep out of the reach of children. Store at room temperature between 15 and 30 degrees C (59 and 85 degrees F). Protect from light. Throw away any unused medication after the expiration date. NOTE: This sheet is a summary. It may not cover all possible information. If you have questions about this medicine, talk to your doctor, pharmacist, or health care provider.  2023 Elsevier/Gold Standard (2007-12-15 00:00:00)  Muscle pain Nausea Pain, redness, or irritation at injection site This list may not describe all possible side effects. Call your doctor for medical advice about side effects. You may report side effects to FDA at 1-800-FDA-1088. Where should I keep my medication? This medication is given in a hospital or clinic and will not be stored at home. NOTE: This sheet is a summary. It may not cover all possible information. If you have questions about this medicine, talk to your doctor, pharmacist, or health care provider.  2023 Elsevier/Gold Standard (2021-02-04 00:00:00)   \[id number]\

## 2022-12-15 ENCOUNTER — Encounter: Payer: Self-pay | Admitting: Nurse Practitioner

## 2022-12-21 ENCOUNTER — Ambulatory Visit: Payer: BC Managed Care – PPO | Admitting: Nurse Practitioner

## 2022-12-22 ENCOUNTER — Inpatient Hospital Stay: Payer: Medicare (Managed Care)

## 2022-12-22 ENCOUNTER — Inpatient Hospital Stay: Payer: Medicare (Managed Care) | Attending: Internal Medicine

## 2022-12-22 VITALS — BP 145/73 | HR 76 | Temp 98.6°F | Resp 18

## 2022-12-22 DIAGNOSIS — E538 Deficiency of other specified B group vitamins: Secondary | ICD-10-CM | POA: Diagnosis present

## 2022-12-22 DIAGNOSIS — D509 Iron deficiency anemia, unspecified: Secondary | ICD-10-CM | POA: Diagnosis present

## 2022-12-22 MED ORDER — SODIUM CHLORIDE 0.9 % IV SOLN
200.0000 mg | Freq: Once | INTRAVENOUS | Status: AC
  Administered 2022-12-22: 200 mg via INTRAVENOUS
  Filled 2022-12-22: qty 10

## 2022-12-22 MED ORDER — CYANOCOBALAMIN 1000 MCG/ML IJ SOLN
1000.0000 ug | Freq: Once | INTRAMUSCULAR | Status: AC
  Administered 2022-12-22: 1000 ug via INTRAMUSCULAR
  Filled 2022-12-22: qty 1

## 2022-12-22 MED ORDER — SODIUM CHLORIDE 0.9 % IV SOLN
Freq: Once | INTRAVENOUS | Status: AC
  Filled 2022-12-22: qty 250

## 2022-12-22 NOTE — Progress Notes (Signed)
Pt has been educated and understands. Pt refused to stay 30 mins after iron infusion. VSS.

## 2023-01-20 ENCOUNTER — Inpatient Hospital Stay: Payer: Medicare (Managed Care) | Attending: Internal Medicine

## 2023-01-20 ENCOUNTER — Inpatient Hospital Stay: Payer: Medicare (Managed Care)

## 2023-01-20 ENCOUNTER — Inpatient Hospital Stay (HOSPITAL_BASED_OUTPATIENT_CLINIC_OR_DEPARTMENT_OTHER): Payer: Medicare (Managed Care) | Admitting: Internal Medicine

## 2023-01-20 ENCOUNTER — Encounter: Payer: Self-pay | Admitting: Internal Medicine

## 2023-01-20 VITALS — BP 124/86

## 2023-01-20 VITALS — BP 130/57 | HR 87 | Temp 98.7°F | Resp 18 | Wt 202.0 lb

## 2023-01-20 DIAGNOSIS — D509 Iron deficiency anemia, unspecified: Secondary | ICD-10-CM

## 2023-01-20 DIAGNOSIS — E538 Deficiency of other specified B group vitamins: Secondary | ICD-10-CM

## 2023-01-20 DIAGNOSIS — Z7901 Long term (current) use of anticoagulants: Secondary | ICD-10-CM | POA: Diagnosis not present

## 2023-01-20 DIAGNOSIS — I4891 Unspecified atrial fibrillation: Secondary | ICD-10-CM | POA: Insufficient documentation

## 2023-01-20 LAB — CBC WITH DIFFERENTIAL/PLATELET
Abs Immature Granulocytes: 0.03 10*3/uL (ref 0.00–0.07)
Basophils Absolute: 0.1 10*3/uL (ref 0.0–0.1)
Basophils Relative: 1 %
Eosinophils Absolute: 0.6 10*3/uL — ABNORMAL HIGH (ref 0.0–0.5)
Eosinophils Relative: 6 %
HCT: 40.7 % (ref 36.0–46.0)
Hemoglobin: 12.3 g/dL (ref 12.0–15.0)
Immature Granulocytes: 0 %
Lymphocytes Relative: 19 %
Lymphs Abs: 1.8 10*3/uL (ref 0.7–4.0)
MCH: 26.2 pg (ref 26.0–34.0)
MCHC: 30.2 g/dL (ref 30.0–36.0)
MCV: 86.6 fL (ref 80.0–100.0)
Monocytes Absolute: 0.7 10*3/uL (ref 0.1–1.0)
Monocytes Relative: 8 %
Neutro Abs: 6.1 10*3/uL (ref 1.7–7.7)
Neutrophils Relative %: 66 %
Platelets: 322 10*3/uL (ref 150–400)
RBC: 4.7 MIL/uL (ref 3.87–5.11)
RDW: 15.3 % (ref 11.5–15.5)
WBC: 9.3 10*3/uL (ref 4.0–10.5)
nRBC: 0 % (ref 0.0–0.2)

## 2023-01-20 LAB — VITAMIN B12: Vitamin B-12: 301 pg/mL (ref 180–914)

## 2023-01-20 LAB — IRON AND TIBC
Iron: 46 ug/dL (ref 28–170)
Saturation Ratios: 9 % — ABNORMAL LOW (ref 10.4–31.8)
TIBC: 526 ug/dL — ABNORMAL HIGH (ref 250–450)
UIBC: 480 ug/dL

## 2023-01-20 LAB — FERRITIN: Ferritin: 30 ng/mL (ref 11–307)

## 2023-01-20 MED ORDER — CYANOCOBALAMIN 1000 MCG/ML IJ SOLN
1000.0000 ug | Freq: Once | INTRAMUSCULAR | Status: AC
  Administered 2023-01-20: 1000 ug via INTRAMUSCULAR
  Filled 2023-01-20: qty 1

## 2023-01-20 MED ORDER — SODIUM CHLORIDE 0.9 % IV SOLN
Freq: Once | INTRAVENOUS | Status: AC
  Filled 2023-01-20: qty 250

## 2023-01-20 MED ORDER — SODIUM CHLORIDE 0.9 % IV SOLN
200.0000 mg | Freq: Once | INTRAVENOUS | Status: AC
  Administered 2023-01-20: 200 mg via INTRAVENOUS
  Filled 2023-01-20: qty 200

## 2023-01-20 NOTE — Patient Instructions (Signed)
#   Recommend barimelt+ iron [over-the-counter medication].  1 a day.  Sublingual to help with absorption.

## 2023-01-20 NOTE — Patient Instructions (Signed)
Iron Sucrose Injection What is this medication? IRON SUCROSE (EYE ern SOO krose) treats low levels of iron (iron deficiency anemia) in people with kidney disease. Iron is a mineral that plays an important role in making red blood cells, which carry oxygen from your lungs to the rest of your body. This medicine may be used for other purposes; ask your health care provider or pharmacist if you have questions. COMMON BRAND NAME(S): Venofer What should I tell my care team before I take this medication? They need to know if you have any of these conditions: Anemia not caused by low iron levels Heart disease High levels of iron in the blood Kidney disease Liver disease An unusual or allergic reaction to iron, other medications, foods, dyes, or preservatives Pregnant or trying to get pregnant Breastfeeding How should I use this medication? This medication is for infusion into a vein. It is given in a hospital or clinic setting. Talk to your care team about the use of this medication in children. While this medication may be prescribed for children as young as 2 years for selected conditions, precautions do apply. Overdosage: If you think you have taken too much of this medicine contact a poison control center or emergency room at once. NOTE: This medicine is only for you. Do not share this medicine with others. What if I miss a dose? Keep appointments for follow-up doses. It is important not to miss your dose. Call your care team if you are unable to keep an appointment. What may interact with this medication? Do not take this medication with any of the following: Deferoxamine Dimercaprol Other iron products This medication may also interact with the following: Chloramphenicol Deferasirox This list may not describe all possible interactions. Give your health care provider a list of all the medicines, herbs, non-prescription drugs, or dietary supplements you use. Also tell them if you smoke,  drink alcohol, or use illegal drugs. Some items may interact with your medicine. What should I watch for while using this medication? Visit your care team regularly. Tell your care team if your symptoms do not start to get better or if they get worse. You may need blood work done while you are taking this medication. You may need to follow a special diet. Talk to your care team. Foods that contain iron include: whole grains/cereals, dried fruits, beans, or peas, leafy green vegetables, and organ meats (liver, kidney). What side effects may I notice from receiving this medication? Side effects that you should report to your care team as soon as possible: Allergic reactions--skin rash, itching, hives, swelling of the face, lips, tongue, or throat Low blood pressure--dizziness, feeling faint or lightheaded, blurry vision Shortness of breath Side effects that usually do not require medical attention (report to your care team if they continue or are bothersome): Flushing Headache Joint pain Muscle pain Nausea Pain, redness, or irritation at injection site This list may not describe all possible side effects. Call your doctor for medical advice about side effects. You may report side effects to FDA at 1-800-FDA-1088. Where should I keep my medication? This medication is given in a hospital or clinic and will not be stored at home. NOTE: This sheet is a summary. It may not cover all possible information. If you have questions about this medicine, talk to your doctor, pharmacist, or health care provider.  2023 Elsevier/Gold Standard (2021-02-04 00:00:00)  Vitamin B12 Injection What is this medication? Vitamin B12 (VAHY tuh min B12) prevents and treats low   vitamin B12 levels in your body. It is used in people who do not get enough vitamin B12 from their diet or when their digestive tract does not absorb enough. Vitamin B12 plays an important role in maintaining the health of your nervous system and  red blood cells. This medicine may be used for other purposes; ask your health care provider or pharmacist if you have questions. COMMON BRAND NAME(S): B-12 Compliance Kit, B-12 Injection Kit, Cyomin, Dodex, LA-12, Nutri-Twelve, Physicians EZ Use B-12, Primabalt What should I tell my care team before I take this medication? They need to know if you have any of these conditions: Kidney disease Leber's disease Megaloblastic anemia An unusual or allergic reaction to cyanocobalamin, cobalt, other medications, foods, dyes, or preservatives Pregnant or trying to get pregnant Breast-feeding How should I use this medication? This medication is injected into a muscle or deeply under the skin. It is usually given in a clinic or care team's office. However, your care team may teach you how to inject yourself. Follow all instructions. Talk to your care team about the use of this medication in children. Special care may be needed. Overdosage: If you think you have taken too much of this medicine contact a poison control center or emergency room at once. NOTE: This medicine is only for you. Do not share this medicine with others. What if I miss a dose? If you are given your dose at a clinic or care team's office, call to reschedule your appointment. If you give your own injections, and you miss a dose, take it as soon as you can. If it is almost time for your next dose, take only that dose. Do not take double or extra doses. What may interact with this medication? Alcohol Colchicine This list may not describe all possible interactions. Give your health care provider a list of all the medicines, herbs, non-prescription drugs, or dietary supplements you use. Also tell them if you smoke, drink alcohol, or use illegal drugs. Some items may interact with your medicine. What should I watch for while using this medication? Visit your care team regularly. You may need blood work done while you are taking this  medication. You may need to follow a special diet. Talk to your care team. Limit your alcohol intake and avoid smoking to get the best benefit. What side effects may I notice from receiving this medication? Side effects that you should report to your care team as soon as possible: Allergic reactions--skin rash, itching, hives, swelling of the face, lips, tongue, or throat Swelling of the ankles, hands, or feet Trouble breathing Side effects that usually do not require medical attention (report to your care team if they continue or are bothersome): Diarrhea This list may not describe all possible side effects. Call your doctor for medical advice about side effects. You may report side effects to FDA at 1-800-FDA-1088. Where should I keep my medication? Keep out of the reach of children. Store at room temperature between 15 and 30 degrees C (59 and 85 degrees F). Protect from light. Throw away any unused medication after the expiration date. NOTE: This sheet is a summary. It may not cover all possible information. If you have questions about this medicine, talk to your doctor, pharmacist, or health care provider.  2023 Elsevier/Gold Standard (2021-07-06 00:00:00)   

## 2023-01-20 NOTE — Progress Notes (Signed)
[name] NOTE  Patient Care Team: Venita Lick, NP as PCP - General (Nurse Practitioner) Cammie Sickle, MD as Consulting Physician (Hematology and Oncology)  CHIEF COMPLAINTS/PURPOSE OF CONSULTATION: Anemia iron deficiency/B12 deficiency  #Anemia-iron/B12 deficiency [LA, NP-July 2022]-s/p EGD colonoscopy [OCT 2022; Dr.Toledo]; hx of capsule [in last 5 years] [JAN 2021]  # A.fib on eliquis.   HISTORY OF PRESENTING ILLNESS: Alone; ambulating independently.   Mary Irwin 72 y.o.  female with a history of B12/iron deficiency of unclear etiology is here for follow-up.  Fatigue from life events- mother with dementia. Dyspnea with exertion.   No visible blood in stools. 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 Appetite is good.   Otherwise denies any blood in stools or black-colored stools.   Review of Systems  Constitutional:  Positive for malaise/fatigue. Negative for chills, diaphoresis, fever and weight loss.  HENT:  Negative for nosebleeds and sore throat.   Eyes:  Negative for double vision.  Respiratory:  Negative for cough, hemoptysis, sputum production, shortness of breath and wheezing.   Cardiovascular:  Negative for chest pain, palpitations, orthopnea and leg swelling.  Gastrointestinal:  Negative for abdominal pain, blood in stool, constipation, diarrhea, heartburn, melena, nausea and vomiting.  Genitourinary:  Negative for dysuria, frequency and urgency.  Musculoskeletal:  Positive for back pain and joint pain.  Skin: Negative.  Negative for itching and rash.  Neurological:  Negative for dizziness, tingling, focal weakness, weakness and headaches.  Endo/Heme/Allergies:  Does not bruise/bleed easily.  Psychiatric/Behavioral:  Negative for depression. The patient is not nervous/anxious and does not have insomnia.      MEDICAL HISTORY:  Past Medical History:  Diagnosis Date   Anemia    on iron supplements   Arthritis    all  over   Asthma    once a week   Atrial fibrillation (HCC)    Chronic seasonal allergic rhinitis due to pollen    Diverticulosis    Dysrhythmia    Atrial Fibrillation   Fibromyalgia    GERD (gastroesophageal reflux disease)    HLD (hyperlipidemia)    Hypertension    Hypothyroidism    no meds   IBS (irritable bowel syndrome)    PONV (postoperative nausea and vomiting)    Psychophysiological insomnia    PVC's (premature ventricular contractions)    Sinus trouble     SURGICAL HISTORY: Past Surgical History:  Procedure Laterality Date   BACK SURGERY     CATARACT EXTRACTION W/PHACO Right 04/22/2019   Procedure: CATARACT EXTRACTION PHACO AND INTRAOCULAR LENS PLACEMENT ([place]) RIGHT;  Surgeon: Eulogio Bear, MD;  Location: [place];  Service: Ophthalmology;  Laterality: Right;   CATARACT EXTRACTION W/PHACO Left 05/27/2019   Procedure: CATARACT EXTRACTION PHACO AND INTRAOCULAR LENS PLACEMENT ([place])  LEFT;  Surgeon: Eulogio Bear, MD;  Location: [place];  Service: Ophthalmology;  Laterality: Left;   CHOLECYSTECTOMY N/A 09/22/2017   Procedure: LAPAROSCOPIC CHOLECYSTECTOMY WITH INTRAOPERATIVE CHOLANGIOGRAM;  Surgeon: Olean Ree, MD;  Location: [facility];  Service: General;  Laterality: N/A;   COLONOSCOPY WITH PROPOFOL N/A 05/23/2017   Procedure: COLONOSCOPY WITH PROPOFOL;  Surgeon: Lollie Sails, MD;  Location: [facility];  Service: Endoscopy;  Laterality: N/A;   COLONOSCOPY WITH PROPOFOL N/A 08/18/2021   Procedure: COLONOSCOPY WITH PROPOFOL;  Surgeon: Toledo, [name], MD;  Location: [facility];  Service: Gastroenterology;  Laterality: N/A;   ESOPHAGOGASTRODUODENOSCOPY (EGD) WITH PROPOFOL N/A 05/23/2017   Procedure: ESOPHAGOGASTRODUODENOSCOPY (EGD) WITH PROPOFOL;  Surgeon:  Lollie Sails, MD;  Location: [facility]COPY;  Service: Endoscopy;  Laterality: N/A;   ESOPHAGOGASTRODUODENOSCOPY (EGD) WITH PROPOFOL N/A 07/31/2017   Procedure:  ESOPHAGOGASTRODUODENOSCOPY (EGD) WITH PROPOFOL;  Surgeon: Lollie Sails, MD;  Location: [facility];  Service: Endoscopy;  Laterality: N/A;   ESOPHAGOGASTRODUODENOSCOPY (EGD) WITH PROPOFOL N/A 08/18/2021   Procedure: ESOPHAGOGASTRODUODENOSCOPY (EGD) WITH PROPOFOL;  Surgeon: Toledo, [name], MD;  Location: [facility];  Service: Gastroenterology;  Laterality: N/A;   EYE SURGERY     GIVENS CAPSULE STUDY     HERNIA REPAIR     umbilical   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   JOINT REPLACEMENT Bilateral    total hip replacement    SOCIAL HISTORY: Social History   Socioeconomic History   Marital status: Divorced    Spouse name: Not on file   Number of children: Not on file   Years of education: Not on file   Highest education level: Not on file  Occupational History   Not on file  Tobacco Use   Smoking status: Never   Smokeless tobacco: Never  Vaping Use   Vaping Use: Never used  Substance and Sexual Activity   Alcohol use: No   Drug use: No   Sexual activity: Not Currently  Other Topics Concern   Not on file  Social History Narrative   Not on file   Social Determinants of Health   Financial Resource Strain: Medium Risk (09/12/2022)   Overall Financial Resource Strain (CARDIA)    Difficulty of Paying Living Expenses: Somewhat hard  Food Insecurity: No Food Insecurity (09/12/2022)   Hunger Vital Sign    Worried About Running Out of Food in the Last Year: Never true    Ran Out of Food in the Last Year: Never true  Transportation Needs: No Transportation Needs (09/12/2022)   PRAPARE - Hydrologist (Medical): No    Lack of Transportation (Non-Medical): No  Physical Activity: Inactive (09/12/2022)   Exercise Vital Sign    Days of Exercise per Week: 0 days    Minutes of Exercise per Session: 0 min  Stress: Stress Concern Present (09/12/2022)   [place]     Feeling of Stress : To some extent  Social Connections: Socially Isolated (09/12/2022)   Social Connection and Isolation Panel [NHANES]    Frequency of Communication with Friends and Family: Three times a week    Frequency of Social Gatherings with Friends and Family: Three times a week    Attends Religious Services: Never    Active Member of Clubs or Organizations: No    Attends Archivist Meetings: Never    Marital Status: Divorced  Human resources officer Violence: Not At Risk (09/12/2022)   Humiliation, Afraid, Rape, and Kick questionnaire    Fear of Current or Ex-Partner: No    Emotionally Abused: No    Physically Abused: No    Sexually Abused: No    FAMILY HISTORY: Family History  Problem Relation Age of Onset   Breast cancer Maternal Aunt 21   Hypertension Mother     ALLERGIES:  is allergic to gabapentin, lisinopril, and metoclopramide.  MEDICATIONS:  Current Outpatient Medications  Medication Sig Dispense Refill   albuterol (PROVENTIL HFA;VENTOLIN HFA) 108 (90 Base) MCG/ACT inhaler Inhale 2 puffs into the lungs every 6 (six) hours as needed for wheezing or shortness of breath.     amitriptyline (ELAVIL) 25 MG tablet Take  25 mg by mouth at bedtime.     apixaban (ELIQUIS) 5 MG TABS tablet Take 5 mg by mouth 2 (two) times daily. Am and bedtime     BREO ELLIPTA 200-25 MCG/ACT AEPB INHALE 1 PUFF INTO THE LUNGS DAILY 60 each 4   Cholecalciferol (VITAMIN D3) 1.25 MG (50000 UT) CAPS TAKE 1 CAPSULE BY MOUTH 1 TIME A WEEK 12 capsule 4   diltiazem (CARDIZEM CD) 300 MG 24 hr capsule Take 300 mg by mouth daily.     hydrochlorothiazide (HYDRODIURIL) 25 MG tablet Take 25 mg by mouth daily. am     Hyoscyamine Sulfate SL 0.125 MG SUBL Place 0.125 mg under the tongue every 6 (six) hours as needed. Place 1 tablet (0.125 mg total) under the tongue every 6 (six) hours as needed for Cramping     LINZESS 72 MCG capsule Take 72 mcg by mouth every morning.     ondansetron (ZOFRAN-ODT) 4 MG  disintegrating tablet Take 4 mg by mouth every 8 (eight) hours as needed for nausea or vomiting.     pantoprazole (PROTONIX) 40 MG tablet Take 40 mg by mouth 2 (two) times daily. Am and bedtime     rosuvastatin (CRESTOR) 10 MG tablet TAKE 1 TABLET(10 MG) BY MOUTH DAILY 90 tablet 4   tiZANidine (ZANAFLEX) 4 MG tablet Take 1 tablet (4 mg total) by mouth every 6 (six) hours as needed for muscle spasms. 30 tablet 0   HYDROcodone-acetaminophen (NORCO/VICODIN) 5-325 MG tablet 1 tablet every 6 (six) hours as needed. (Patient not taking: Reported on 01/20/2023)     No current facility-administered medications for this visit.   Facility-Administered Medications Ordered in Other Visits  Medication Dose Route Frequency Provider Last Rate Last Admin   iron sucrose (VENOFER) 200 mg in sodium chloride 0.9 % 100 mL IVPB  200 mg Intravenous Once Charlaine Dalton R, MD 440 mL/hr at 01/20/23 1500 200 mg at 01/20/23 1500      .  PHYSICAL EXAMINATION:  Vitals:   01/20/23 1419  BP: (!) 130/57  Pulse: 87  Resp: 18  Temp: 98.7 F (37.1 C)  SpO2: 99%   Filed Weights   01/20/23 1419  Weight: 202 lb (91.6 kg)    Physical Exam Vitals and nursing note reviewed.  HENT:     Head: Normocephalic and atraumatic.     Mouth/Throat:     Pharynx: Oropharynx is clear.  Eyes:     Extraocular Movements: Extraocular movements intact.     Pupils: Pupils are equal, round, and reactive to light.  Cardiovascular:     Rate and Rhythm: Normal rate and regular rhythm.  Pulmonary:     Comments: Decreased breath sounds bilaterally.  Abdominal:     Palpations: Abdomen is soft.  Musculoskeletal:        General: Normal range of motion.     Cervical back: Normal range of motion.  Skin:    General: Skin is warm.  Neurological:     General: No focal deficit present.     Mental Status: She is alert and oriented to person, place, and time.  Psychiatric:        Behavior: Behavior normal.        Judgment: Judgment  normal.      LABORATORY DATA:  I have reviewed the data as listed Lab Results  Component Value Date   WBC 9.3 01/20/2023   HGB 12.3 01/20/2023   HCT 40.7 01/20/2023   MCV 86.6 01/20/2023   PLT 322  01/20/2023   Recent Labs    06/20/22 1335  NA 141  K 4.4  CL 102  CO2 23  GLUCOSE 90  BUN 12  CREATININE 0.79  CALCIUM 10.6*  PROT 7.1  ALBUMIN 4.6  AST 33  ALT 25  ALKPHOS 129*  BILITOT 0.2     RADIOGRAPHIC STUDIES: I have personally reviewed the radiological images as listed and agreed with the findings in the report. No results found.  B12 deficiency # Iron deficient anemia-unclear etiology.  S/p IV Venofer;   Hemoglobin today is 12.3. DEC 2023 saturation 6%.  Proceed with Venofer-monthly along with B12 injections. Recommend barimelt+ iron [over-the-counter medication].  1 a day.  Sublingual to help with absorption.   #Etiology of iron deficient is unclear s/p EGD/colonoscopy [[facility];Ms.Woodard; stool occult+] October 2022; if worsening consider capsule study [previously in last 5 years]/CT imaging- FEB 2021-NEG.    # B12 deficiency- b12 170 [2023. jan]. continue monthly b12 1000 mcg every 4 weeks.   # A.fib- on eliquis [Dr.Parashoes]   DISPOSITION: # Venofer today; B12 injection today # No appts in April 2024 # 2 month- b12 injection; venofer # in 3  months- MD;  labs-  [cbc, ferritin, iron studies; b12 ]  possible venofer/b12 injection-Dr.B  All questions were answered. The patient knows to call the clinic with any problems, questions or concerns.    Cammie Sickle, MD 01/20/2023 3:14 PM

## 2023-01-20 NOTE — Progress Notes (Signed)
Fatigue from life events. Occ dizziness a couple times a week. Dyspnea with exertion. No visible blood in stools. 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 Appetite is good.

## 2023-01-20 NOTE — Assessment & Plan Note (Signed)
#   Iron deficient anemia-unclear etiology.  S/p IV Venofer;   Hemoglobin today is 12.3. DEC 2023 saturation 6%.  Proceed with Venofer-monthly along with B12 injections. Recommend barimelt+ iron [over-the-counter medication].  1 a day.  Sublingual to help with absorption.   #Etiology of iron deficient is unclear s/p EGD/colonoscopy [[facility];Ms.Woodard; stool occult+] October 2022; if worsening consider capsule study [previously in last 5 years]/CT imaging- FEB 2021-NEG.    # B12 deficiency- b12 170 [2023. jan]. continue monthly b12 1000 mcg every 4 weeks.   # A.fib- on eliquis [Dr.Parashoes]   DISPOSITION: # Venofer today; B12 injection today # No appts in April 2024 # 2 month- b12 injection; venofer # in 3  months- MD;  labs-  [cbc, ferritin, iron studies; b12 ]  possible venofer/b12 injection-Dr.B

## 2023-02-09 HISTORY — PX: REPLACEMENT TOTAL KNEE: SUR1224

## 2023-03-22 ENCOUNTER — Inpatient Hospital Stay: Payer: Medicare (Managed Care) | Attending: Internal Medicine

## 2023-03-22 VITALS — BP 138/61 | HR 82 | Temp 98.2°F | Resp 17

## 2023-03-22 DIAGNOSIS — D509 Iron deficiency anemia, unspecified: Secondary | ICD-10-CM | POA: Insufficient documentation

## 2023-03-22 DIAGNOSIS — E538 Deficiency of other specified B group vitamins: Secondary | ICD-10-CM | POA: Diagnosis present

## 2023-03-22 MED ORDER — CYANOCOBALAMIN 1000 MCG/ML IJ SOLN
1000.0000 ug | Freq: Once | INTRAMUSCULAR | Status: AC
  Administered 2023-03-22: 1000 ug via INTRAMUSCULAR
  Filled 2023-03-22: qty 1

## 2023-03-22 MED ORDER — SODIUM CHLORIDE 0.9 % IV SOLN
Freq: Once | INTRAVENOUS | Status: AC
  Filled 2023-03-22: qty 250

## 2023-03-22 MED ORDER — SODIUM CHLORIDE 0.9 % IV SOLN
200.0000 mg | Freq: Once | INTRAVENOUS | Status: AC
  Administered 2023-03-22: 200 mg via INTRAVENOUS
  Filled 2023-03-22: qty 200

## 2023-03-22 NOTE — Patient Instructions (Signed)

## 2023-03-22 NOTE — Progress Notes (Signed)
Patient tolerated Venofer infusion well, no questions/concerns voiced. Monitored 30 min post transfusion. B12 also given.  Patient stable at discharge. VSS. Refused AVS .

## 2023-04-24 ENCOUNTER — Inpatient Hospital Stay: Payer: Medicare (Managed Care) | Attending: Internal Medicine

## 2023-04-24 DIAGNOSIS — Z79899 Other long term (current) drug therapy: Secondary | ICD-10-CM | POA: Diagnosis not present

## 2023-04-24 DIAGNOSIS — E039 Hypothyroidism, unspecified: Secondary | ICD-10-CM | POA: Insufficient documentation

## 2023-04-24 DIAGNOSIS — E538 Deficiency of other specified B group vitamins: Secondary | ICD-10-CM | POA: Insufficient documentation

## 2023-04-24 DIAGNOSIS — R011 Cardiac murmur, unspecified: Secondary | ICD-10-CM | POA: Insufficient documentation

## 2023-04-24 DIAGNOSIS — I4891 Unspecified atrial fibrillation: Secondary | ICD-10-CM | POA: Insufficient documentation

## 2023-04-24 DIAGNOSIS — Z803 Family history of malignant neoplasm of breast: Secondary | ICD-10-CM | POA: Insufficient documentation

## 2023-04-24 DIAGNOSIS — M25511 Pain in right shoulder: Secondary | ICD-10-CM | POA: Insufficient documentation

## 2023-04-24 DIAGNOSIS — R5383 Other fatigue: Secondary | ICD-10-CM | POA: Diagnosis not present

## 2023-04-24 DIAGNOSIS — E785 Hyperlipidemia, unspecified: Secondary | ICD-10-CM | POA: Diagnosis not present

## 2023-04-24 DIAGNOSIS — K219 Gastro-esophageal reflux disease without esophagitis: Secondary | ICD-10-CM | POA: Diagnosis not present

## 2023-04-24 DIAGNOSIS — I10 Essential (primary) hypertension: Secondary | ICD-10-CM | POA: Diagnosis not present

## 2023-04-24 DIAGNOSIS — M25561 Pain in right knee: Secondary | ICD-10-CM | POA: Insufficient documentation

## 2023-04-24 DIAGNOSIS — D509 Iron deficiency anemia, unspecified: Secondary | ICD-10-CM | POA: Insufficient documentation

## 2023-04-24 DIAGNOSIS — Z7901 Long term (current) use of anticoagulants: Secondary | ICD-10-CM | POA: Diagnosis not present

## 2023-04-24 DIAGNOSIS — M797 Fibromyalgia: Secondary | ICD-10-CM | POA: Insufficient documentation

## 2023-04-24 DIAGNOSIS — Z7951 Long term (current) use of inhaled steroids: Secondary | ICD-10-CM | POA: Diagnosis not present

## 2023-04-24 LAB — CBC WITH DIFFERENTIAL (CANCER CENTER ONLY)
Abs Immature Granulocytes: 0.06 10*3/uL (ref 0.00–0.07)
Basophils Absolute: 0.1 10*3/uL (ref 0.0–0.1)
Basophils Relative: 1 %
Eosinophils Absolute: 0.2 10*3/uL (ref 0.0–0.5)
Eosinophils Relative: 2 %
HCT: 38.1 % (ref 36.0–46.0)
Hemoglobin: 11.5 g/dL — ABNORMAL LOW (ref 12.0–15.0)
Immature Granulocytes: 1 %
Lymphocytes Relative: 11 %
Lymphs Abs: 1.4 10*3/uL (ref 0.7–4.0)
MCH: 24.9 pg — ABNORMAL LOW (ref 26.0–34.0)
MCHC: 30.2 g/dL (ref 30.0–36.0)
MCV: 82.6 fL (ref 80.0–100.0)
Monocytes Absolute: 0.8 10*3/uL (ref 0.1–1.0)
Monocytes Relative: 6 %
Neutro Abs: 10.3 10*3/uL — ABNORMAL HIGH (ref 1.7–7.7)
Neutrophils Relative %: 79 %
Platelet Count: 445 10*3/uL — ABNORMAL HIGH (ref 150–400)
RBC: 4.61 MIL/uL (ref 3.87–5.11)
RDW: 16.1 % — ABNORMAL HIGH (ref 11.5–15.5)
WBC Count: 12.9 10*3/uL — ABNORMAL HIGH (ref 4.0–10.5)
nRBC: 0 % (ref 0.0–0.2)

## 2023-04-24 LAB — IRON AND TIBC
Iron: 45 ug/dL (ref 28–170)
Saturation Ratios: 7 % — ABNORMAL LOW (ref 10.4–31.8)
TIBC: 638 ug/dL — ABNORMAL HIGH (ref 250–450)
UIBC: 593 ug/dL

## 2023-04-24 LAB — FERRITIN: Ferritin: 27 ng/mL (ref 11–307)

## 2023-04-24 LAB — VITAMIN B12: Vitamin B-12: 318 pg/mL (ref 180–914)

## 2023-04-26 ENCOUNTER — Inpatient Hospital Stay: Payer: Medicare (Managed Care)

## 2023-04-26 ENCOUNTER — Encounter: Payer: Self-pay | Admitting: Internal Medicine

## 2023-04-26 ENCOUNTER — Inpatient Hospital Stay (HOSPITAL_BASED_OUTPATIENT_CLINIC_OR_DEPARTMENT_OTHER): Payer: Medicare (Managed Care) | Admitting: Internal Medicine

## 2023-04-26 VITALS — BP 129/58 | HR 77 | Temp 97.6°F | Resp 18

## 2023-04-26 VITALS — BP 168/88 | HR 91 | Temp 97.8°F | Wt 192.2 lb

## 2023-04-26 DIAGNOSIS — D509 Iron deficiency anemia, unspecified: Secondary | ICD-10-CM | POA: Diagnosis not present

## 2023-04-26 DIAGNOSIS — E538 Deficiency of other specified B group vitamins: Secondary | ICD-10-CM | POA: Diagnosis not present

## 2023-04-26 MED ORDER — SODIUM CHLORIDE 0.9 % IV SOLN
200.0000 mg | Freq: Once | INTRAVENOUS | Status: AC
  Administered 2023-04-26: 200 mg via INTRAVENOUS
  Filled 2023-04-26: qty 200

## 2023-04-26 MED ORDER — SODIUM CHLORIDE 0.9 % IV SOLN
Freq: Once | INTRAVENOUS | Status: AC
  Filled 2023-04-26: qty 250

## 2023-04-26 MED ORDER — CYANOCOBALAMIN 1000 MCG/ML IJ SOLN
1000.0000 ug | Freq: Once | INTRAMUSCULAR | Status: AC
  Administered 2023-04-26: 1000 ug via INTRAMUSCULAR
  Filled 2023-04-26: qty 1

## 2023-04-26 NOTE — Progress Notes (Signed)
[facility] CONSULT NOTE  Patient Care Team: Marjie Skiff, NP as PCP - General (Nurse Practitioner) Earna Coder, MD as Consulting Physician (Hematology and Oncology)  CHIEF COMPLAINTS/PURPOSE OF CONSULTATION: Anemia iron deficiency/B12 deficiency  #Anemia-iron/B12 deficiency [LA, NP-July 2022]-s/p EGD colonoscopy [OCT 2022; Dr.Toledo]; hx of capsule [in last 5 years] [JAN 2021]  # A.fib on eliquis.   HISTORY OF PRESENTING ILLNESS: Alone; ambulating independently.   Mary Irwin 72 y.o.  female with a history of B12/iron deficiency of unclear etiology is here for follow-up.  Patient is having severe Right shoulder pain and Right knee pain status post treatment surgery April 2024.Mary Irwin Patient would like to switch back to the oxycodone. She had a shot in her Right shoulder last week but it isn't helping   Fatigue from life events- mother with dementia. Dyspnea with exertion.  Otherwise denies any blood in stools or black-colored stools.   Review of Systems  Constitutional:  Positive for malaise/fatigue. Negative for chills, diaphoresis, fever and weight loss.  HENT:  Negative for nosebleeds and sore throat.   Eyes:  Negative for double vision.  Respiratory:  Negative for cough, hemoptysis, sputum production, shortness of breath and wheezing.   Cardiovascular:  Negative for chest pain, palpitations, orthopnea and leg swelling.  Gastrointestinal:  Negative for abdominal pain, blood in stool, constipation, diarrhea, heartburn, melena, nausea and vomiting.  Genitourinary:  Negative for dysuria, frequency and urgency.  Musculoskeletal:  Positive for back pain and joint pain.  Skin: Negative.  Negative for itching and rash.  Neurological:  Negative for dizziness, tingling, focal weakness, weakness and headaches.  Endo/Heme/Allergies:  Does not bruise/bleed easily.  Psychiatric/Behavioral:  Negative for depression. The patient is not nervous/anxious and does not  have insomnia.      MEDICAL HISTORY:  Past Medical History:  Diagnosis Date   Anemia    on iron supplements   Arthritis    all over   Asthma    once a week   Atrial fibrillation (HCC)    Chronic seasonal allergic rhinitis due to pollen    Diverticulosis    Dysrhythmia    Atrial Fibrillation   Fibromyalgia    GERD (gastroesophageal reflux disease)    HLD (hyperlipidemia)    Hypertension    Hypothyroidism    no meds   IBS (irritable bowel syndrome)    PONV (postoperative nausea and vomiting)    Psychophysiological insomnia    PVC's (premature ventricular contractions)    Sinus trouble     SURGICAL HISTORY: Past Surgical History:  Procedure Laterality Date   BACK SURGERY     CATARACT EXTRACTION W/PHACO Right 04/22/2019   Procedure: CATARACT EXTRACTION PHACO AND INTRAOCULAR LENS PLACEMENT ([place]) RIGHT;  Surgeon: Nevada Crane, MD;  Location: [facility];  Service: Ophthalmology;  Laterality: Right;   CATARACT EXTRACTION W/PHACO Left 05/27/2019   Procedure: CATARACT EXTRACTION PHACO AND INTRAOCULAR LENS PLACEMENT ([place])  LEFT;  Surgeon: Nevada Crane, MD;  Location: [facility];  Service: Ophthalmology;  Laterality: Left;   CHOLECYSTECTOMY N/A 09/22/2017   Procedure: LAPAROSCOPIC CHOLECYSTECTOMY WITH INTRAOPERATIVE CHOLANGIOGRAM;  Surgeon: Henrene Dodge, MD;  Location: [facility];  Service: General;  Laterality: N/A;   COLONOSCOPY WITH PROPOFOL N/A 05/23/2017   Procedure: COLONOSCOPY WITH PROPOFOL;  Surgeon: Christena Deem, MD;  Location: [facility]COPY;  Service: Endoscopy;  Laterality: N/A;   COLONOSCOPY WITH PROPOFOL N/A 08/18/2021   Procedure: COLONOSCOPY WITH PROPOFOL;  Surgeon: Norma Fredrickson, Boykin Nearing, MD;  Location: [facility]  ENDOSCOPY;  Service: Gastroenterology;  Laterality: N/A;   ESOPHAGOGASTRODUODENOSCOPY (EGD) WITH PROPOFOL N/A 05/23/2017   Procedure: ESOPHAGOGASTRODUODENOSCOPY (EGD) WITH PROPOFOL;  Surgeon: Christena Deem, MD;  Location: [facility]  ENDOSCOPY;  Service: Endoscopy;  Laterality: N/A;   ESOPHAGOGASTRODUODENOSCOPY (EGD) WITH PROPOFOL N/A 07/31/2017   Procedure: ESOPHAGOGASTRODUODENOSCOPY (EGD) WITH PROPOFOL;  Surgeon: Christena Deem, MD;  Location: [facility] END[facility]COPY;  Service: Endoscopy;  Laterality: N/A;   ESOPHAGOGASTRODUODENOSCOPY (EGD) WITH PROPOFOL N/A 08/18/2021   Procedure: ESOPHAGOGASTRODUODENOSCOPY (EGD) WITH PROPOFOL;  Surgeon: Toledo, Boykin Nearing, MD;  Location: [facility] ENDOSCOPY;  Service: Gastroenterology;  Laterality: N/A;   EYE SURGERY     GIVENS CAPSULE STUDY     HERNIA REPAIR     umbilical   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   JOINT REPLACEMENT Bilateral    total hip replacement    SOCIAL HISTORY: Social History   Socioeconomic History   Marital status: Divorced    Spouse name: Not on file   Number of children: Not on file   Years of education: Not on file   Highest education level: Not on file  Occupational History   Not on file  Tobacco Use   Smoking status: Never   Smokeless tobacco: Never  Vaping Use   Vaping Use: Never used  Substance and Sexual Activity   Alcohol use: No   Drug use: No   Sexual activity: Not Currently  Other Topics Concern   Not on file  Social History Narrative   Not on file   Social Determinants of Health   Financial Resource Strain: Medium Risk (09/12/2022)   Overall Financial Resource Strain (CARDIA)    Difficulty of Paying Living Expenses: Somewhat hard  Food Insecurity: No Food Insecurity (09/12/2022)   Hunger Vital Sign    Worried About Running Out of Food in the Last Year: Never true    Ran Out of Food in the Last Year: Never true  Transportation Needs: No Transportation Needs (09/12/2022)   PRAPARE - Administrator, Civil Service (Medical): No    Lack of Transportation (Non-Medical): No  Physical Activity: Inactive (09/12/2022)   Exercise Vital Sign    Days of Exercise per Week: 0 days    Minutes of Exercise per Session: 0  min  Stress: Stress Concern Present (09/12/2022)   [employer] of Occupational Health - Occupational Stress Questionnaire    Feeling of Stress : To some extent  Social Connections: Socially Isolated (09/12/2022)   Social Connection and Isolation Panel [NHANES]    Frequency of Communication with Friends and Family: Three times a week    Frequency of Social Gatherings with Friends and Family: Three times a week    Attends Religious Services: Never    Active Member of Clubs or Organizations: No    Attends Banker Meetings: Never    Marital Status: Divorced  Catering manager Violence: Not At Risk (09/12/2022)   Humiliation, Afraid, Rape, and Kick questionnaire    Fear of Current or Ex-Partner: No    Emotionally Abused: No    Physically Abused: No    Sexually Abused: No    FAMILY HISTORY: Family History  Problem Relation Age of Onset   Breast cancer Maternal Aunt 50   Hypertension Mother     ALLERGIES:  is allergic to gabapentin, lisinopril, and metoclopramide.  MEDICATIONS:  Current Outpatient Medications  Medication Sig Dispense Refill   albuterol (PROVENTIL HFA;VENTOLIN HFA) 108 (90 Base) MCG/ACT inhaler Inhale 2 puffs into  the lungs every 6 (six) hours as needed for wheezing or shortness of breath.     apixaban (ELIQUIS) 5 MG TABS tablet Take 5 mg by mouth 2 (two) times daily. Am and bedtime     BREO ELLIPTA 200-25 MCG/ACT AEPB INHALE 1 PUFF INTO THE LUNGS DAILY 60 each 4   Cholecalciferol (VITAMIN D3) 1.25 MG (50000 UT) CAPS TAKE 1 CAPSULE BY MOUTH 1 TIME A WEEK 12 capsule 4   diltiazem (CARDIZEM CD) 300 MG 24 hr capsule Take 300 mg by mouth daily.     hydrochlorothiazide (HYDRODIURIL) 25 MG tablet Take 25 mg by mouth daily. am     Hyoscyamine Sulfate SL 0.125 MG SUBL Place 0.125 mg under the tongue every 6 (six) hours as needed. Place 1 tablet (0.125 mg total) under the tongue every 6 (six) hours as needed for Cramping     LINZESS 72 MCG capsule Take 72 mcg by  mouth every morning.     ondansetron (ZOFRAN-ODT) 4 MG disintegrating tablet Take 4 mg by mouth every 8 (eight) hours as needed for nausea or vomiting.     pantoprazole (PROTONIX) 40 MG tablet Take 40 mg by mouth 2 (two) times daily. Am and bedtime     rosuvastatin (CRESTOR) 10 MG tablet TAKE 1 TABLET(10 MG) BY MOUTH DAILY 90 tablet 4   tiZANidine (ZANAFLEX) 4 MG tablet Take 1 tablet (4 mg total) by mouth every 6 (six) hours as needed for muscle spasms. 30 tablet 0   traMADol (ULTRAM) 50 MG tablet Take 50 mg by mouth every 6 (six) hours as needed.     amitriptyline (ELAVIL) 25 MG tablet Take 25 mg by mouth at bedtime. (Patient not taking: Reported on 04/26/2023)     HYDROcodone-acetaminophen (NORCO/VICODIN) 5-325 MG tablet 1 tablet every 6 (six) hours as needed. (Patient not taking: Reported on 01/20/2023)     No current facility-administered medications for this visit.      Mary Irwin  PHYSICAL EXAMINATION:  Vitals:   04/26/23 1319 04/26/23 1324  BP: (!) 152/122 (!) 168/88  Pulse: 91   Temp: 97.8 F (36.6 C)   SpO2: 98%    Filed Weights   04/26/23 1319  Weight: 192 lb 3.2 oz (87.2 kg)    Physical Exam Vitals and nursing note reviewed.  HENT:     Head: Normocephalic and atraumatic.     Mouth/Throat:     Pharynx: Oropharynx is clear.  Eyes:     Extraocular Movements: Extraocular movements intact.     Pupils: Pupils are equal, round, and reactive to light.  Cardiovascular:     Rate and Rhythm: Normal rate and regular rhythm.  Pulmonary:     Comments: Decreased breath sounds bilaterally.  Abdominal:     Palpations: Abdomen is soft.  Musculoskeletal:        General: Normal range of motion.     Cervical back: Normal range of motion.  Skin:    General: Skin is warm.  Neurological:     General: No focal deficit present.     Mental Status: She is alert and oriented to person, place, and time.  Psychiatric:        Behavior: Behavior normal.        Judgment: Judgment normal.       LABORATORY DATA:  I have reviewed the data as listed Lab Results  Component Value Date   WBC 12.9 (H) 04/24/2023   HGB 11.5 (L) 04/24/2023   HCT 38.1 04/24/2023   MCV  82.6 04/24/2023   PLT 445 (H) 04/24/2023   Recent Labs    06/20/22 1335  NA 141  K 4.4  CL 102  CO2 23  GLUCOSE 90  BUN 12  CREATININE 0.79  CALCIUM 10.6*  PROT 7.1  ALBUMIN 4.6  AST 33  ALT 25  ALKPHOS 129*  BILITOT 0.2     RADIOGRAPHIC STUDIES: I have personally reviewed the radiological images as listed and agreed with the findings in the report. No results found.  B12 deficiency # Iron deficient anemia-unclear etiology.  S/p IV Venofer;   # Today- Hemoglobin today is 11.2 JUNE 202 saturation 6%; ferritin- 27- .  Proceed with Venofer-monthly along with B12 injections.   #Etiology of iron deficient is unclear s/p EGD/colonoscopy [[facility];Ms.Woodard; stool occult+] October 2022; if worsening consider capsule study [previously in last 5 years]/CT imaging- FEB 2021-NEG.   # A.fib/heart murmur- on eliquis [Dr,Parashoes]- stable.    # B12 deficiency- b12 170 [2023. jan]. continue monthly b12 1000 mcg every 4 weeks.   # A.fib- on eliquis [Dr.Parashoes]   DISPOSITION: # Venofer today; B12 injection today  # 2 month- b12 injection; venofer  # 4 month- b12 injection; venofer  # in 6 months- MD;  labs-  [cbc, ferritin, iron studies; b12]   possible venofer/b12 injection-Dr.B  All questions were answered. The patient knows to call the clinic with any problems, questions or concerns.    Earna Coder, MD 04/26/2023 1:38 PM

## 2023-04-26 NOTE — Progress Notes (Signed)
Patient is having severe Right shoulder pain and Right knee pain. Patient would like to switch back to the oxycodone. She had a shot in her Right shoulder last week but it isn't helping. Patient is also unable to sleep lately due to her being in pain.

## 2023-04-26 NOTE — Assessment & Plan Note (Addendum)
#   Iron deficient anemia-unclear etiology.  S/p IV Venofer;   # Today- Hemoglobin today is 11.2 JUNE 202 saturation 6%; ferritin- 27- .  Proceed with Venofer-monthly along with B12 injections.   #Etiology of iron deficient is unclear s/p EGD/colonoscopy [[facility];Ms.Woodard; stool occult+] October 2022; if worsening consider capsule study [previously in last 5 years]/CT imaging- FEB 2021-NEG.   # A.fib/heart murmur- on eliquis [Dr,Parashoes]- stable.    # B12 deficiency- b12 170 [2023. jan]. continue monthly b12 1000 mcg every 4 weeks.   # A.fib- on eliquis [Dr.Parashoes]   DISPOSITION: # Venofer today; B12 injection today  # 2 month- b12 injection; venofer  # 4 month- b12 injection; venofer  # in 6 months- MD;  labs-  [cbc, ferritin, iron studies; b12]   possible venofer/b12 injection-Dr.B

## 2023-06-02 ENCOUNTER — Other Ambulatory Visit: Payer: Self-pay | Admitting: Nurse Practitioner

## 2023-06-02 NOTE — Telephone Encounter (Signed)
Requested Prescriptions  Pending Prescriptions Disp Refills   rosuvastatin (CRESTOR) 10 MG tablet [Pharmacy Med Name: ROSUVASTATIN 10MG  TABLETS] 90 tablet 0    Sig: TAKE 1 TABLET(10 MG) BY MOUTH DAILY     Cardiovascular:  Antilipid - Statins 2 Failed - 06/02/2023  9:43 AM      Failed - Lipid Panel in normal range within the last 12 months    Cholesterol, Total  Date Value Ref Range Status  06/20/2022 163 100 - 199 mg/dL Final   LDL Chol Calc (NIH)  Date Value Ref Range Status  06/20/2022 84 0 - 99 mg/dL Final   HDL  Date Value Ref Range Status  06/20/2022 56 >39 mg/dL Final   Triglycerides  Date Value Ref Range Status  06/20/2022 134 0 - 149 mg/dL Final         Passed - Cr in normal range and within 360 days    Creatinine, Ser  Date Value Ref Range Status  06/20/2022 0.79 0.57 - 1.00 mg/dL Final         Passed - Patient is not pregnant      Passed - Valid encounter within last 12 months    Recent Outpatient Visits           7 months ago Rash due to allergy   [place] [facility] Mecum, Erin E, PA-C   7 months ago Chronic bilateral low back pain with right-sided sciatica   [place] [facility] [place], [place] T, NP   8 months ago Chronic bilateral low back pain with right-sided sciatica   [place] [facility] [place], [place] T, NP   11 months ago Paroxysmal atrial fibrillation (HCC)   [place] [facility] [place], Corrie Dandy T, NP   1 year ago Paroxysmal atrial fibrillation [facility])   [place] [facility] [place], Dorie Rank, NP

## 2023-06-26 ENCOUNTER — Inpatient Hospital Stay: Payer: Medicare (Managed Care)

## 2023-06-28 ENCOUNTER — Inpatient Hospital Stay: Payer: Medicare (Managed Care) | Attending: Internal Medicine

## 2023-06-28 VITALS — BP 160/60 | HR 72 | Resp 16

## 2023-06-28 DIAGNOSIS — E538 Deficiency of other specified B group vitamins: Secondary | ICD-10-CM | POA: Insufficient documentation

## 2023-06-28 DIAGNOSIS — D509 Iron deficiency anemia, unspecified: Secondary | ICD-10-CM | POA: Diagnosis present

## 2023-06-28 MED ORDER — CYANOCOBALAMIN 1000 MCG/ML IJ SOLN
1000.0000 ug | Freq: Once | INTRAMUSCULAR | Status: AC
  Administered 2023-06-28: 1000 ug via INTRAMUSCULAR
  Filled 2023-06-28: qty 1

## 2023-06-28 MED ORDER — SODIUM CHLORIDE 0.9 % IV SOLN
Freq: Once | INTRAVENOUS | Status: AC
  Filled 2023-06-28: qty 250

## 2023-06-28 MED ORDER — SODIUM CHLORIDE 0.9 % IV SOLN
200.0000 mg | Freq: Once | INTRAVENOUS | Status: AC
  Administered 2023-06-28: 200 mg via INTRAVENOUS
  Filled 2023-06-28: qty 200

## 2023-07-05 ENCOUNTER — Other Ambulatory Visit: Payer: Self-pay | Admitting: Nurse Practitioner

## 2023-07-06 NOTE — Telephone Encounter (Signed)
Requested Prescriptions  Pending Prescriptions Disp Refills   fluticasone furoate-vilanterol (BREO ELLIPTA) 200-25 MCG/ACT AEPB [Pharmacy Med Name: BREO ELLIPTA 200-25MCG ORAL INH(30)] 90 each 0    Sig: INHALE 1 PUFF INTO THE LUNGS DAILY     Pulmonology:  Combination Products Passed - 07/05/2023  8:36 AM      Passed - Valid encounter within last 12 months    Recent Outpatient Visits           8 months ago Rash due to allergy   [facility] Mecum, Erin E, PA-C   8 months ago Chronic bilateral low back pain with right-sided sciatica   [place] [facility] [place], [place] T, NP   9 months ago Chronic bilateral low back pain with right-sided sciatica   [place] [facility] [place], Corrie Dandy T, NP   1 year ago Paroxysmal atrial fibrillation [facility])   [place] [facility] [place], Corrie Dandy T, NP   1 year ago Paroxysmal atrial fibrillation [facility])   [place] [facility] [place], Dorie Rank, NP

## 2023-07-18 ENCOUNTER — Ambulatory Visit: Payer: Self-pay

## 2023-07-18 ENCOUNTER — Ambulatory Visit: Payer: Medicare (Managed Care) | Admitting: Nurse Practitioner

## 2023-07-18 NOTE — Telephone Encounter (Signed)
    Chief Complaint: Abdominal pain with vomiting. "I can't keep my appointment today.I have to vomit again." Disconnected call. Symptoms: Above Frequency: Today Pertinent Negatives: Patient denies  Disposition: [] ED /[] Urgent Care (no appt availability in office) / [] Appointment(In office/virtual)/ []  [place] [facility]/ [x] Home Care/ [] Refused Recommended Disposition /[] [place] [facility]/ []  Follow-up with PCP Additional Notes: Called pt. Back and left message to call back as needed. Call 911 for worsening of symptoms.  Reason for Disposition  [1] MILD-MODERATE pain AND [2] constant and [3] present < 2 hours  Answer Assessment - Initial Assessment Questions 1. LOCATION: "Where does it hurt?"      Lower 2. RADIATION: "Does the pain shoot anywhere else?" (e.g., chest, back)     No 3. ONSET: "When did the pain begin?" (e.g., minutes, hours or days ago)      Today 4. SUDDEN: "Gradual or sudden onset?"     Sudden 5. PATTERN "Does the pain come and go, or is it constant?"    - If it comes and goes: "How long does it last?" "Do you have pain now?"     (Note: Comes and goes means the pain is intermittent. It goes away completely between bouts.)    - If constant: "Is it getting better, staying the same, or getting worse?"      (Note: Constant means the pain never goes away completely; most serious pain is constant and gets worse.)      Constant 6. SEVERITY: "How bad is the pain?"  (e.g., Scale 1-10; mild, moderate, or severe)    - MILD (1-3): Doesn't interfere with normal activities, abdomen soft and not tender to touch.     - MODERATE (4-7): Interferes with normal activities or awakens from sleep, abdomen tender to touch.     - SEVERE (8-10): Excruciating pain, doubled over, unable to do any normal activities.       Severe 7. RECURRENT SYMPTOM: "Have you ever had this type of stomach pain before?" If Yes, ask: "When was the last time?" and "What happened that time?"      N/a 8.  CAUSE: "What do you think is causing the stomach pain?"     Unsure 9. RELIEVING/AGGRAVATING FACTORS: "What makes it better or worse?" (e.g., antacids, bending or twisting motion, bowel movement)     No 10. OTHER SYMPTOMS: "Do you have any other symptoms?" (e.g., back pain, diarrhea, fever, urination pain, vomiting)       Vomiting 11. PREGNANCY: "Is there any chance you are pregnant?" "When was your last menstrual period?"       No  Protocols used: Abdominal Pain - [facility]

## 2023-07-23 NOTE — Patient Instructions (Signed)
Be Involved in Caring For Your Health:  Taking Medications When medications are taken as directed, they can greatly improve your health. But if they are not taken as prescribed, they may not work. In some cases, not taking them correctly can be harmful. To help ensure your treatment remains effective and safe, understand your medications and how to take them. Bring your medications to each visit for review by your provider.  Your lab results, notes, and after visit summary will be available on My Chart. We strongly encourage you to use this feature. If lab results are abnormal the clinic will contact you with the appropriate steps. If the clinic does not contact you assume the results are satisfactory. You can always view your results on My Chart. If you have questions regarding your health or results, please contact the clinic during office hours. You can also ask questions on My Chart.  We at [facility] are grateful that you chose [place] to provide your care. We strive to provide evidence-based and compassionate care and are always looking for feedback. If you get a survey from the clinic please complete this so we can hear your opinions.  Iron Deficiency Anemia, Adult  Iron deficiency anemia is when you do not have enough red blood cells or hemoglobin in your blood. This happens because you have too little iron in your body. Hemoglobin carries oxygen to parts of the body. Anemia can cause your body to not get enough oxygen. What are the causes? Not eating enough foods that have iron in them. The body not being able to take in iron well. Blood loss. What increases the risk? Having menstrual periods. Being pregnant. What are the signs or symptoms? Pale skin, lips, and nails. Weakness, dizziness, and getting tired easily. Feeling like you cannot breathe well when moving (shortness of breath). Cold hands and feet. Mild anemia may not cause any symptoms. How is this treated? This  condition is treated by finding out why you do not have enough iron and then getting more iron. It may include: Adding foods to your diet that have a lot of iron. Taking iron pills (supplements). If you are pregnant or breastfeeding, you may need to take extra iron. Your diet often does not provide the amount of iron that you need. Getting more vitamin C in your diet. Vitamin C helps your body take in iron. You may need to take iron pills with a glass of orange juice or vitamin C pills. Medicines to make heavy menstrual periods lighter. Surgery or testing procedures to find what is causing the condition. You may need blood tests to see if treatment is working. If the treatment does not seem to be working, you may need more tests. Follow these instructions at home: Medicines Take over-the-counter and prescription medicines only as told by your doctor. This includes iron pills and vitamins. Taking them as told is important because too much iron can be harmful. Take iron pills when your stomach is empty. If you cannot handle this, take them with food. Do not drink milk or take antacids at the same time as your iron pills. Iron pills may turn your poop (stool)black. If you cannot handle taking iron pills by mouth, ask your doctor about getting iron through: An IV tube. A shot (injection) into a muscle. Eating and drinking Talk with your doctor before changing the foods you eat. Your doctor may tell you to eat foods that have a lot of iron, such as: Liver.  Low-fat (lean) beef. Breads and cereals that have iron added to them. Eggs. Dried fruit. Dark green, leafy vegetables. Eat fresh fruits and vegetables that are high in vitamin C. They help your body use iron. Foods with a lot of vitamin C include: Oranges. Peppers. Tomatoes. Mangoes. Managing constipation If you are taking iron pills, they may cause trouble pooping (constipation). To prevent or treat this, you may need to: Drink enough  fluid to keep your pee (urine) pale yellow. Take over-the-counter or prescription medicines. Eat foods that are high in fiber. These include beans, whole grains, and fresh fruits and vegetables. Limit foods that are high in fat and sugar. These include fried or sweet foods. General instructions Return to your normal activities when your doctor says that it is safe. Keep all follow-up visits. Contact a doctor if: You feel like you may vomit (nauseous), or you vomit. You feel weak. You get light-headed when getting up from sitting or lying down. You are sweating for no reason. You have trouble pooping. You have worse breathing with physical activity. You have heaviness in your chest. Get help right away if: You faint. If this happens, do not drive yourself to the hospital. You have a fast heartbeat, or a heartbeat that does not feel regular. Summary Iron deficiency anemia happens when you have too little iron in your body. This condition is treated by finding out why you do not have enough iron in your body and then getting more iron. Take over-the-counter and prescription medicines only as told by your doctor. Eat fresh fruits and vegetables that are high in vitamin C. Contact a doctor if you have trouble pooping or feel weak. This information is not intended to replace advice given to you by your health care provider. Make sure you discuss any questions you have with your health care provider. Document Revised: 12/02/2021 Document Reviewed: 12/02/2021 Elsevier Patient Education  2024 [employer].

## 2023-07-25 ENCOUNTER — Ambulatory Visit (INDEPENDENT_AMBULATORY_CARE_PROVIDER_SITE_OTHER): Payer: Medicare (Managed Care) | Admitting: Nurse Practitioner

## 2023-07-25 ENCOUNTER — Encounter: Payer: Self-pay | Admitting: Nurse Practitioner

## 2023-07-25 VITALS — BP 132/86 | HR 72 | Temp 98.4°F | Ht 64.5 in | Wt 193.0 lb

## 2023-07-25 DIAGNOSIS — I700 Atherosclerosis of aorta: Secondary | ICD-10-CM | POA: Diagnosis not present

## 2023-07-25 DIAGNOSIS — I480 Paroxysmal atrial fibrillation: Secondary | ICD-10-CM

## 2023-07-25 DIAGNOSIS — J4520 Mild intermittent asthma, uncomplicated: Secondary | ICD-10-CM

## 2023-07-25 DIAGNOSIS — D508 Other iron deficiency anemias: Secondary | ICD-10-CM

## 2023-07-25 DIAGNOSIS — Z23 Encounter for immunization: Secondary | ICD-10-CM

## 2023-07-25 DIAGNOSIS — E559 Vitamin D deficiency, unspecified: Secondary | ICD-10-CM

## 2023-07-25 DIAGNOSIS — M797 Fibromyalgia: Secondary | ICD-10-CM

## 2023-07-25 DIAGNOSIS — I10 Essential (primary) hypertension: Secondary | ICD-10-CM | POA: Diagnosis not present

## 2023-07-25 DIAGNOSIS — E7800 Pure hypercholesterolemia, unspecified: Secondary | ICD-10-CM

## 2023-07-25 DIAGNOSIS — D6869 Other thrombophilia: Secondary | ICD-10-CM | POA: Diagnosis not present

## 2023-07-25 DIAGNOSIS — K219 Gastro-esophageal reflux disease without esophagitis: Secondary | ICD-10-CM

## 2023-07-25 DIAGNOSIS — E079 Disorder of thyroid, unspecified: Secondary | ICD-10-CM

## 2023-07-25 LAB — MICROALBUMIN, URINE WAIVED
Creatinine, Urine Waived: 200 mg/dL (ref 10–300)
Microalb, Ur Waived: 30 mg/L — ABNORMAL HIGH (ref 0–19)
Microalb/Creat Ratio: <30 mg/g (ref <30)

## 2023-07-25 MED ORDER — ROSUVASTATIN CALCIUM 10 MG PO TABS: 10.0000 mg | ORAL_TABLET | Freq: Every day | ORAL | 4 refills | Status: DC

## 2023-07-25 MED ORDER — FLUTICASONE FUROATE-VILANTEROL 200-25 MCG/ACT IN AEPB: 1.0000 | INHALATION_SPRAY | Freq: Every day | RESPIRATORY_TRACT | 4 refills | Status: DC

## 2023-07-25 MED ORDER — DULOXETINE HCL 30 MG PO CPEP: 30.0000 mg | ORAL_CAPSULE | Freq: Every day | ORAL | 4 refills | Status: DC

## 2023-07-25 NOTE — Assessment & Plan Note (Signed)
Ongoing.  Noted on CT 01/03/2020 -- discussed with patient and educated on this.  Continue Rosuvastatin daily and ASA.

## 2023-07-25 NOTE — Assessment & Plan Note (Signed)
Recent TSH within normal range, no current medications.  Check TSH and Free T4 today.

## 2023-07-25 NOTE — Assessment & Plan Note (Signed)
Chronic, ongoing, rate controlled.  Followed by cardiology.  Continue this collaboration and current medication regimen as prescribed by them, including Eliquis.

## 2023-07-25 NOTE — Progress Notes (Signed)
BP 132/86 (BP Location: Left Arm)   Pulse 72   Temp 98.4 F (36.9 C) (Oral)   Ht 5' 4.5" (1.638 m)   Wt 193 lb (87.5 kg)   SpO2 100%   BMI 32.62 kg/m    Subjective:    Patient ID: Mary Irwin, female    DOB: September 26, 1951, 72 y.o.   MRN: [id number]  HPI: Mary Irwin is a 72 y.o. female  Chief Complaint  Patient presents with   Fibromyalgia   Hyperlipidemia   Hypertension   Gastroesophageal Reflux   HYPERTENSION / HYPERLIPIDEMIA + A-FIB Taking Eliquis, Cardizem, HCTZ, + Rosuvastatin. Aortic atherosclerosis noted on imaging 01/03/2020. Has stressor with her mother having dementia and helping care for her.  Last saw cardiology 01/23/23 with no changes. Satisfied with current treatment? yes Duration of hypertension: chronic BP monitoring frequency: rarely BP range:  BP medication side effects: no Past BP meds: unsure Aspirin: no Recent stressors: no Recurrent headaches: no Visual changes: no Palpitations: no Dyspnea: no Chest pain: no Lower extremity edema: occasional at present, more since surgery on knee Dizzy/lightheaded: no The 10-year ASCVD risk score (Arnett DK, et al., 2019) is: 15.7%   Values used to calculate the score:     Age: 61 years     Sex: Female     Is Non-Hispanic African American: No     Diabetic: No     Tobacco smoker: No     Systolic Blood Pressure: 132 mmHg     Is BP treated: Yes     HDL Cholesterol: 56 mg/dL     Total Cholesterol: 163 mg/dL    ANEMIA Followed by hematology -- last visit was 04/26/23, continues with iron infusions and B12 injections.  History of thyroid labs elevations -- recent were stable.   Anemia status: controlled Etiology of anemia: iron deficiency Severity of anemia: moderate Fatigue: at present, continues to help care for her mother with dementia Decreased exercise tolerance: no Dyspnea on exertion: no Palpitations: no Bleeding: no Pica: no    GERD Last saw GI 12/22/21 - [facility], Dr. Margarita Mail -- they increased  Amitriptyline to 25 MG.  Follows with them IBS (with constipation) and fibromyalgia + GERD.  Is taking Linzess, but feels this messes her abdomen up.  Amitriptyline did not help with discomfort and she stopped this.     Has chronic pain with with Fibromyalgia -- she does not recall what she has taken.  Followed with physiatry in past -- last visit 03/11/21, spondylosis lumbar -- they were going to evaluate for spinal cord stimulator.  She has not returned to see them.  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 Has taken Gabapentin in past, this caused rash.    History of low Vit D 15.7, taking supplement weekly. GERD control status: controlled Satisfied with current treatment? yes Heartburn frequency: none Medication side effects: no  Medication compliance: stable Previous GERD medications: none Antacid use frequency:  none lately Duration: none Nature: none Dysphagia: no Odynophagia:  no Hematemesis: no Blood in stool: no EGD: 07/31/2017   ASTHMA Continues on Breo and Albuterol.  Last saw pulmonary, Dr. Meredeth Ide, 01/25/22. Asthma status: stable Satisfied with current treatment?: yes Albuterol/rescue inhaler frequency: twice a week Dyspnea frequency: with activity Wheezing frequency: no Cough frequency: occasional over past month Nocturnal symptom frequency: no Limitation of activity: no Current upper respiratory symptoms: no Triggers: unknown Home peak flows: none Last Spirometry: with pulmonary Failed/intolerant to following asthma meds: none Asthma meds in past: Breo Aerochamber/spacer  use: no Visits to ER or Urgent Care in past year: no Pneumovax: Up to Date Influenza: Up to Date      07/25/2023    2:03 PM 10/18/2022    1:17 PM 09/12/2022   12:43 PM 06/20/2022    1:10 PM 12/21/2021   12:13 PM  Depression screen PHQ 2/9  Decreased Interest 0 0 0 0 0  Down, Depressed, Hopeless 0 0 0 0 0  PHQ - 2 Score 0 0 0 0 0  Altered sleeping 0 0 0 0 1  Tired, decreased energy 2 2 0 2 1   Change in appetite 0 0 1 0 0  Feeling bad or failure about yourself  0 0 0 0 0  Trouble concentrating 0 0 0 0 0  Moving slowly or fidgety/restless 0 0 0 0 0  Suicidal thoughts 0 0 0 0 0  PHQ-9 Score 2 2 1 2 2   Difficult doing work/chores Not difficult at all Not difficult at all Not difficult at all Not difficult at all Not difficult at all       07/25/2023    2:04 PM 10/18/2022    1:17 PM 06/20/2022    1:10 PM  GAD 7 : Generalized Anxiety Score  Nervous, Anxious, on Edge 0 0 0  Control/stop worrying 0 0 0  Worry too much - different things 0 0 0  Trouble relaxing 0 0 0  Restless 0 0 0  Easily annoyed or irritable 0 0 0  Afraid - awful might happen 0 0 0  Total GAD 7 Score 0 0 0  Anxiety Difficulty Not difficult at all Not difficult at all Not difficult at all   Relevant past medical, surgical, family and social history reviewed and updated as indicated. Interim medical history since our last visit reviewed. Allergies and medications reviewed and updated.  Review of Systems  Constitutional:  Negative for activity change, appetite change, diaphoresis, fatigue and fever.  Respiratory:  Negative for cough, chest tightness, shortness of breath and wheezing.   Cardiovascular:  Negative for chest pain, palpitations and leg swelling.  Gastrointestinal:  Negative for abdominal distention, abdominal pain, blood in stool, constipation, diarrhea, nausea and vomiting.  Musculoskeletal:  Positive for arthralgias.  Neurological: Negative.   Psychiatric/Behavioral: Negative.      Per HPI unless specifically indicated above     Objective:    BP 132/86 (BP Location: Left Arm)   Pulse 72   Temp 98.4 F (36.9 C) (Oral)   Ht 5' 4.5" (1.638 m)   Wt 193 lb (87.5 kg)   SpO2 100%   BMI 32.62 kg/m   Wt Readings from Last 3 Encounters:  07/25/23 193 lb (87.5 kg)  04/26/23 192 lb 3.2 oz (87.2 kg)  01/20/23 202 lb (91.6 kg)    Physical Exam Vitals and nursing note reviewed.   Constitutional:      General: She is awake. She is not in acute distress.    Appearance: She is well-developed and well-groomed. She is obese. She is not ill-appearing or toxic-appearing.  HENT:     Head: Normocephalic and atraumatic.     Right Ear: Hearing normal.     Left Ear: Hearing normal.     Nose: Nose normal.  Eyes:     General: Lids are normal.        Right eye: No discharge.        Left eye: No discharge.     Conjunctiva/sclera: Conjunctivae normal.  Pupils: Pupils are equal, round, and reactive to light.  Neck:     Thyroid: No thyromegaly.     Vascular: No carotid bruit.  Cardiovascular:     Rate and Rhythm: Normal rate and regular rhythm.     Heart sounds: Murmur heard.     Systolic murmur is present with a grade of 2/6.     No gallop.  Pulmonary:     Effort: Pulmonary effort is normal. No accessory muscle usage or respiratory distress.     Breath sounds: Normal breath sounds. No decreased breath sounds, wheezing or rhonchi.  Abdominal:     General: Bowel sounds are normal. There is no distension.     Palpations: Abdomen is soft. There is no hepatomegaly.     Tenderness: There is no abdominal tenderness.  Musculoskeletal:     Cervical back: Normal range of motion and neck supple.     Right lower leg: Edema (trace) present.     Left lower leg: Edema (trace) present.  Lymphadenopathy:     Cervical: No cervical adenopathy.  Skin:    General: Skin is warm and dry.  Neurological:     Mental Status: She is alert and oriented to person, place, and time.     Deep Tendon Reflexes: Reflexes are normal and symmetric.     Reflex Scores:      Brachioradialis reflexes are 2+ on the right side and 2+ on the left side.      Patellar reflexes are 2+ on the right side and 2+ on the left side. Psychiatric:        Attention and Perception: Attention normal.        Mood and Affect: Mood normal.        Speech: Speech normal.        Behavior: Behavior normal. Behavior is  cooperative.        Thought Content: Thought content normal.     Results for orders placed or performed in visit on 07/25/23  Microalbumin, Urine Waived  Result Value Ref Range   Microalb, Ur Waived 30 (H) 0 - 19 mg/L   Creatinine, Urine Waived 200 10 - 300 mg/dL   Microalb/Creat Ratio <30 <30 mg/g      Assessment & Plan:   Problem List Items Addressed This Visit       Cardiovascular and Mediastinum   Aortic atherosclerosis (HCC)    Ongoing.  Noted on CT 01/03/2020 -- discussed with patient and educated on this.  Continue Rosuvastatin daily and ASA.      Relevant Medications   rosuvastatin (CRESTOR) 10 MG tablet   Other Relevant Orders   Lipid Panel w/o Chol/HDL Ratio   Comprehensive metabolic panel   Essential hypertension    Chronic, ongoing.  BP close to goal on recheck today.  Recommend she monitor BP at least a few mornings a week at home and document.  DASH diet at home.  Continue current medication regimen and adjust as needed + collaboration with cardiology.  Labs today: Lipid, TSH, CMP.  Return in 6 months.      Relevant Medications   rosuvastatin (CRESTOR) 10 MG tablet   Other Relevant Orders   Comprehensive metabolic panel   Microalbumin, Urine Waived (Completed)   Paroxysmal atrial fibrillation (HCC) - Primary    Chronic, ongoing, rate controlled.  Followed by cardiology.  Continue this collaboration and current medication regimen as prescribed by them, including Eliquis.       Relevant Medications   rosuvastatin (  CRESTOR) 10 MG tablet     Respiratory   Asthma without status asthmaticus    Chronic, ongoing, followed by pulmonary with last visit in 2023.  Continue current inhaler regimen and adjust as needed.  Currently no exacerbation.      Relevant Medications   fluticasone furoate-vilanterol (BREO ELLIPTA) 200-25 MCG/ACT AEPB     Digestive   GERD without esophagitis    Chronic, ongoing.  Currently not taking Protonix or Amitriptyline, reports no  benefit from this.  Would benefit return to GI in future, although she wishes to hold off on this.  Continue Linzess.      Relevant Orders   Magnesium     Endocrine   Thyroid disease    Recent TSH within normal range, no current medications.  Check TSH and Free T4 today.      Relevant Orders   TSH   T4, free     Hematopoietic and Hemostatic   Other thrombophilia (HCC)    Secondary to Eliquis and A-Fib.  Continue current medication regimen and adjust as needed.          Other   Fibromyalgia    Chronic, ongoing.  Reports no benefit from Amitriptyline in past and Gabapentin caused a rash.  Will trial Cymbalta, starting at 30 MG daily.  Educated her on this and side effects.  Continue to collaborate with physiatry as needed.  Discussed with patient that she may benefit referral to pain clinic in future if ongoing discomfort and poor control.      Relevant Medications   HYDROcodone-acetaminophen (NORCO/VICODIN) 5-325 MG tablet   DULoxetine (CYMBALTA) 30 MG capsule   Other Relevant Orders   Magnesium   Comprehensive metabolic panel   Hyperlipemia    Chronic, ongoing.   Continue Rosuvastatin and adjust dosing as needed. Recheck lipid panel and CMP today.      Relevant Medications   rosuvastatin (CRESTOR) 10 MG tablet   Other Relevant Orders   Lipid Panel w/o Chol/HDL Ratio   Comprehensive metabolic panel   Iron deficiency anemia    Chronic, stable, followed by hematology. Recent notes reviewed. Continue collaboration with them and infusions.      Vitamin D deficiency    Chronic, ongoing.  Check level today and recommend she take supplement as ordered.      Relevant Orders   VITAMIN D 25 Hydroxy (Vit-D Deficiency, Fractures)   Other Visit Diagnoses     Flu vaccine need       Flu vaccine in office today, educated on this.        Follow up plan: Return in about 6 weeks (around 09/05/2023) for Fibromyalgia -- added Duloxetine and Wellness visit due with nurse  09/13/23.

## 2023-07-25 NOTE — Assessment & Plan Note (Signed)
Secondary to Eliquis and A-Fib.  Continue current medication regimen and adjust as needed.

## 2023-07-25 NOTE — Assessment & Plan Note (Signed)
Chronic, ongoing, followed by pulmonary with last visit in 2023.  Continue current inhaler regimen and adjust as needed.  Currently no exacerbation.

## 2023-07-25 NOTE — Assessment & Plan Note (Signed)
Chronic, ongoing.   Continue Rosuvastatin and adjust dosing as needed. Recheck lipid panel and CMP today.

## 2023-07-25 NOTE — Assessment & Plan Note (Signed)
Chronic, ongoing.  Check level today and recommend she take supplement as ordered.

## 2023-07-25 NOTE — Assessment & Plan Note (Signed)
Chronic, ongoing.  BP close to goal on recheck today.  Recommend she monitor BP at least a few mornings a week at home and document.  DASH diet at home.  Continue current medication regimen and adjust as needed + collaboration with cardiology.  Labs today: Lipid, TSH, CMP.  Return in 6 months.

## 2023-07-25 NOTE — Assessment & Plan Note (Signed)
Chronic, ongoing.  Currently not taking Protonix or Amitriptyline, reports no benefit from this.  Would benefit return to GI in future, although she wishes to hold off on this.  Continue Linzess.

## 2023-07-25 NOTE — Assessment & Plan Note (Signed)
Chronic, stable, followed by hematology. Recent notes reviewed. Continue collaboration with them and infusions.

## 2023-07-25 NOTE — Assessment & Plan Note (Signed)
Chronic, ongoing.  Reports no benefit from Amitriptyline in past and Gabapentin caused a rash.  Will trial Cymbalta, starting at 30 MG daily.  Educated her on this and side effects.  Continue to collaborate with physiatry as needed.  Discussed with patient that she may benefit referral to pain clinic in future if ongoing discomfort and poor control.

## 2023-07-26 ENCOUNTER — Telehealth: Payer: Self-pay

## 2023-07-26 ENCOUNTER — Other Ambulatory Visit: Payer: Self-pay | Admitting: Nurse Practitioner

## 2023-07-26 NOTE — Telephone Encounter (Signed)
ERROR

## 2023-07-26 NOTE — Progress Notes (Signed)
Contacted via MyChart   Good afternoon Mary Irwin, your labs have returned: - Cholesterol levels are stable with exception of triglycerides, these are a bit elevated.  Continue Rosuvastatin and we may adjust dose next visit. - Kidney function, creatinine and eGFR, remains normal, as is liver function, AST and ALT.  - Vitamin D level a little too high.  Cut back on Vitamin D, bu stopping weekly dose and taking 2000 units every other day, which you can get over the counter. - Remainder of labs are stable.  Any questions on these? Keep being amazing!!  Thank you for allowing me to participate in your care.  I appreciate you. Kindest regards, Sears Oran

## 2023-08-18 ENCOUNTER — Encounter: Payer: Self-pay | Admitting: Nurse Practitioner

## 2023-08-25 ENCOUNTER — Inpatient Hospital Stay: Payer: Medicare (Managed Care) | Attending: Internal Medicine

## 2023-08-25 VITALS — BP 132/65 | HR 73 | Temp 98.7°F | Resp 16

## 2023-08-25 DIAGNOSIS — E538 Deficiency of other specified B group vitamins: Secondary | ICD-10-CM | POA: Diagnosis not present

## 2023-08-25 DIAGNOSIS — D509 Iron deficiency anemia, unspecified: Secondary | ICD-10-CM | POA: Diagnosis present

## 2023-08-25 MED ORDER — SODIUM CHLORIDE 0.9 % IV SOLN
Freq: Once | INTRAVENOUS | Status: AC
  Filled 2023-08-25: qty 250

## 2023-08-25 MED ORDER — SODIUM CHLORIDE 0.9 % IV SOLN
200.0000 mg | Freq: Once | INTRAVENOUS | Status: AC
  Administered 2023-08-25: 200 mg via INTRAVENOUS
  Filled 2023-08-25: qty 200

## 2023-08-25 MED ORDER — CYANOCOBALAMIN 1000 MCG/ML IJ SOLN
1000.0000 ug | Freq: Once | INTRAMUSCULAR | Status: AC
  Administered 2023-08-25: 1000 ug via INTRAMUSCULAR
  Filled 2023-08-25: qty 1

## 2023-09-05 ENCOUNTER — Encounter: Payer: Self-pay | Admitting: Nurse Practitioner

## 2023-09-05 ENCOUNTER — Ambulatory Visit (INDEPENDENT_AMBULATORY_CARE_PROVIDER_SITE_OTHER): Payer: Medicare (Managed Care) | Admitting: Nurse Practitioner

## 2023-09-05 VITALS — BP 132/78 | HR 83 | Temp 98.5°F | Ht 64.5 in | Wt 190.2 lb

## 2023-09-05 DIAGNOSIS — M797 Fibromyalgia: Secondary | ICD-10-CM

## 2023-09-05 MED ORDER — DULOXETINE HCL 60 MG PO CPEP: 60.0000 mg | ORAL_CAPSULE | Freq: Every day | ORAL | 4 refills | Status: DC

## 2023-09-05 NOTE — Assessment & Plan Note (Signed)
Chronic, ongoing.  Reports no benefit from Amitriptyline in past and Gabapentin caused a rash.  Duloxetine is offering her benefit, less pain.  Will increase to 60 MG to help through winter months, but if dose causes any anhedonia she is to alert provider and will reduce back to 30 MG.  Educated her on this and side effects.  Continue to collaborate with physiatry as needed.  Discussed with patient that she may benefit referral to pain clinic in future if ongoing discomfort and poor control.

## 2023-09-05 NOTE — Patient Instructions (Signed)
Myofascial Pain Syndrome and Fibromyalgia Myofascial pain syndrome and fibromyalgia are both pain disorders. You may feel this pain mainly in your muscles. Myofascial pain syndrome: Always has tender points in the muscles that will cause pain when pressed (trigger points). The pain may come and go. Usually affects your neck, upper back, and shoulder areas. The pain often moves into your arms and hands. Fibromyalgia: Has muscle pains and tenderness that come and go. Is often associated with tiredness (fatigue) and sleep problems. Has trigger points. Tends to be long-lasting (chronic), but is not life-threatening. Fibromyalgia and myofascial pain syndrome are not the same. However, they often occur together. If you have both conditions, each can make the other worse. Both are common and can cause enough pain and fatigue to make day-to-day activities difficult. Both can be hard to diagnose because their symptoms are common in many other conditions. What are the causes? The exact causes of these conditions are not known. What increases the risk? You are more likely to develop either of these conditions if: You have a family history of the condition. You are female. You have certain triggers, such as: Spine disorders. An injury (trauma) or other physical stressors. Being under a lot of stress. Medical conditions such as osteoarthritis, rheumatoid arthritis, or lupus. What are the signs or symptoms? Fibromyalgia The main symptom of fibromyalgia is widespread pain and tenderness in your muscles. Pain is sometimes described as stabbing, shooting, or burning. You may also have: Tingling or numbness. Sleep problems and fatigue. Problems with attention and concentration (fibro fog). Other symptoms may include: Bowel and bladder problems. Headaches. Vision problems. Sensitivity to odors and noises. Depression or mood changes. Painful menstrual periods (dysmenorrhea). Dry skin or eyes. These  symptoms can vary over time. Myofascial pain syndrome Symptoms of myofascial pain syndrome include: Tight, ropy bands of muscle. Uncomfortable sensations in muscle areas. These may include aching, cramping, burning, numbness, tingling, and weakness. Difficulty moving certain parts of the body freely (poor range of motion). How is this diagnosed? This condition may be diagnosed by your symptoms and medical history. You will also have a physical exam. In general: Fibromyalgia is diagnosed if you have pain, fatigue, and other symptoms for more than 3 months, and symptoms cannot be explained by another condition. Myofascial pain syndrome is diagnosed if you have trigger points in your muscles, and those trigger points are tender and cause pain elsewhere in your body (referred pain). How is this treated? Treatment for these conditions depends on the type that you have. For fibromyalgia, a healthy lifestyle is the most important treatment including aerobic and strength exercises. Different types of medicines are used to help treat pain and include: NSAIDs. Medicines for treating depression. Medicines that help control seizures. Medicines that relax the muscles. Treatment for myofascial pain syndrome includes: Pain medicines, such as NSAIDs. Cooling and stretching of muscles. Massage therapy with myofascial release technique. Trigger point injections. Treating these conditions often requires a team of health care providers. These may include: Your primary care provider. A physical therapist. Complementary health care providers, such as massage therapists or acupuncturists. A psychiatrist for cognitive behavioral therapy. Follow these instructions at home: Medicines Take over-the-counter and prescription medicines only as told by your health care provider. Ask your health care provider if the medicine prescribed to you: Requires you to avoid driving or using machinery. Can cause constipation.  You may need to take these actions to prevent or treat constipation: Drink enough fluid to keep your urine pale   yellow. Take over-the-counter or prescription medicines. Eat foods that are high in fiber, such as beans, whole grains, and fresh fruits and vegetables. Limit foods that are high in fat and processed sugars, such as fried or sweet foods. Lifestyle  Do exercises as told by your health care provider or physical therapist. Practice relaxation techniques to control your stress. You may want to try: Biofeedback. Visual imagery. Hypnosis. Muscle relaxation. Yoga. Meditation. Maintain a healthy lifestyle. This includes eating a healthy diet and getting enough sleep. Do not use any products that contain nicotine or tobacco. These products include cigarettes, chewing tobacco, and vaping devices, such as e-cigarettes. If you need help quitting, ask your health care provider. General instructions Talk to your health care provider about complementary treatments, such as acupuncture or massage. Do not do activities that stress or strain your muscles. This includes repetitive motions and heavy lifting. Keep all follow-up visits. This is important. Where to find support Consider joining a support group with others who are diagnosed with this condition. National Fibromyalgia Association: fmaware.org Where to find more information [place] Foundation: uspainfoundation.org Contact a health care provider if: You have new symptoms. Your symptoms get worse or your pain is severe. You have side effects from your medicines. You have trouble sleeping. Your condition is causing depression or anxiety. Get help right away if: You have thoughts of hurting yourself or others. Get help right away if you feel like you may hurt yourself or others, or have thoughts about taking your own life. Go to your nearest emergency room or: Call 911. Call the National Suicide Prevention Lifeline at 1-800-273-8255  or 988. This is open 24 hours a day. Text the Crisis Text Line at 741741. This information is not intended to replace advice given to you by your health care provider. Make sure you discuss any questions you have with your health care provider. Document Revised: 08/01/2022 Document Reviewed: 09/24/2021 Elsevier Patient Education  2024 [employer].  

## 2023-09-05 NOTE — Progress Notes (Signed)
BP 132/78 (BP Location: Left Arm)   Pulse 83   Temp 98.5 F (36.9 C) (Oral)   Ht 5' 4.5" (1.638 m)   Wt 190 lb 3.2 oz (86.3 kg)   SpO2 97%   BMI 32.14 kg/m    Subjective:    Patient ID: Mary Irwin, female    DOB: 09-28-1951, 72 y.o.   MRN: [id number]  HPI: Mary Irwin is a 72 y.o. female  Chief Complaint  Patient presents with   Fibromyalgia    6 week f/up   FIBROMYALGIA Started on Duloxetine on 07/25/23. Feeling better at this time, Duloxetine is offering benefit. Pain status: better Satisfied with current treatment?: yes Medication side effects: no Medication compliance: good compliance Quality: aching and throbbing Current pain level: 2/10 Previous pain level: 8/10 Aggravating factors: lifting, movement, and bending Alleviating factors: Duloxetine Previous pain specialty evaluation: physiatry last 03/11/21 Non-narcotic analgesic meds: yes Narcotic contract:no Treatments attempted: rest, heat, APAP, ibuprofen, and aleve, Gabapentin, Duloxetine    Relevant past medical, surgical, family and social history reviewed and updated as indicated. Interim medical history since our last visit reviewed. Allergies and medications reviewed and updated.  Review of Systems  Constitutional:  Negative for activity change, appetite change, diaphoresis, fatigue and fever.  Respiratory:  Negative for cough, chest tightness, shortness of breath and wheezing.   Cardiovascular:  Negative for chest pain, palpitations and leg swelling.  Gastrointestinal:  Negative for abdominal distention, abdominal pain, blood in stool, constipation, diarrhea, nausea and vomiting.  Musculoskeletal:  Positive for arthralgias.  Neurological: Negative.   Psychiatric/Behavioral: Negative.      Per HPI unless specifically indicated above     Objective:    BP 132/78 (BP Location: Left Arm)   Pulse 83   Temp 98.5 F (36.9 C) (Oral)   Ht 5' 4.5" (1.638 m)   Wt 190 lb 3.2 oz (86.3 kg)   SpO2 97%    BMI 32.14 kg/m   Wt Readings from Last 3 Encounters:  09/05/23 190 lb 3.2 oz (86.3 kg)  07/25/23 193 lb (87.5 kg)  04/26/23 192 lb 3.2 oz (87.2 kg)    Physical Exam Vitals and nursing note reviewed.  Constitutional:      General: She is awake. She is not in acute distress.    Appearance: She is well-developed and well-groomed. She is obese. She is not ill-appearing or toxic-appearing.  HENT:     Head: Normocephalic and atraumatic.     Right Ear: Hearing normal.     Left Ear: Hearing normal.     Nose: Nose normal.  Eyes:     General: Lids are normal.        Right eye: No discharge.        Left eye: No discharge.     Conjunctiva/sclera: Conjunctivae normal.     Pupils: Pupils are equal, round, and reactive to light.  Neck:     Thyroid: No thyromegaly.     Vascular: No carotid bruit.  Cardiovascular:     Rate and Rhythm: Normal rate and regular rhythm.     Heart sounds: Murmur heard.     Systolic murmur is present with a grade of 2/6.     No gallop.  Pulmonary:     Effort: Pulmonary effort is normal. No accessory muscle usage or respiratory distress.     Breath sounds: Normal breath sounds. No decreased breath sounds, wheezing or rhonchi.  Abdominal:     General: Bowel sounds are normal. There  is no distension.     Palpations: Abdomen is soft. There is no hepatomegaly.     Tenderness: There is no abdominal tenderness.  Musculoskeletal:     Cervical back: Normal range of motion and neck supple.     Right lower leg: No edema.     Left lower leg: No edema.  Lymphadenopathy:     Cervical: No cervical adenopathy.  Skin:    General: Skin is warm and dry.  Neurological:     Mental Status: She is alert and oriented to person, place, and time.     Deep Tendon Reflexes: Reflexes are normal and symmetric.     Reflex Scores:      Brachioradialis reflexes are 2+ on the right side and 2+ on the left side.      Patellar reflexes are 2+ on the right side and 2+ on the left  side. Psychiatric:        Attention and Perception: Attention normal.        Mood and Affect: Mood normal.        Speech: Speech normal.        Behavior: Behavior normal. Behavior is cooperative.        Thought Content: Thought content normal.     Results for orders placed or performed in visit on 07/25/23  TSH  Result Value Ref Range   TSH 3.930 0.450 - 4.500 uIU/mL  Lipid Panel w/o Chol/HDL Ratio  Result Value Ref Range   Cholesterol, Total 171 100 - 199 mg/dL   Triglycerides 657 (H) 0 - 149 mg/dL   HDL 63 >84 mg/dL   VLDL Cholesterol Cal 29 5 - 40 mg/dL   LDL Chol Calc (NIH) 79 0 - 99 mg/dL  Magnesium  Result Value Ref Range   Magnesium 1.9 1.6 - 2.3 mg/dL  Comprehensive metabolic panel  Result Value Ref Range   Glucose 84 70 - 99 mg/dL   BUN 15 8 - 27 mg/dL   Creatinine, Ser 6.96 0.57 - 1.00 mg/dL   eGFR 93 >29 BM/WUX/3.24   BUN/Creatinine Ratio 22 12 - 28   Sodium 143 134 - 144 mmol/L   Potassium 4.1 3.5 - 5.2 mmol/L   Chloride 106 96 - 106 mmol/L   CO2 22 20 - 29 mmol/L   Calcium 10.4 (H) 8.7 - 10.3 mg/dL   Total Protein 7.1 6.0 - 8.5 g/dL   Albumin 4.8 3.8 - 4.8 g/dL   Globulin, Total 2.3 1.5 - 4.5 g/dL   Bilirubin Total 0.2 0.0 - 1.2 mg/dL   Alkaline Phosphatase 107 44 - 121 IU/L   AST 20 0 - 40 IU/L   ALT 19 0 - 32 IU/L  T4, free  Result Value Ref Range   Free T4 1.20 0.82 - 1.77 ng/dL  Microalbumin, Urine Waived  Result Value Ref Range   Microalb, Ur Waived 30 (H) 0 - 19 mg/L   Creatinine, Urine Waived 200 10 - 300 mg/dL   Microalb/Creat Ratio <30 <30 mg/g  VITAMIN D 25 Hydroxy (Vit-D Deficiency, Fractures)  Result Value Ref Range   Vit D, 25-Hydroxy 129.0 (H) 30.0 - 100.0 ng/mL      Assessment & Plan:   Problem List Items Addressed This Visit       Other   Fibromyalgia - Primary    Chronic, ongoing.  Reports no benefit from Amitriptyline in past and Gabapentin caused a rash.  Duloxetine is offering her benefit, less pain.  Will increase to 60  MG to help through winter months, but if dose causes any anhedonia she is to alert provider and will reduce back to 30 MG.  Educated her on this and side effects.  Continue to collaborate with physiatry as needed.  Discussed with patient that she may benefit referral to pain clinic in future if ongoing discomfort and poor control.      Relevant Medications   DULoxetine (CYMBALTA) 60 MG capsule     Follow up plan: Return in about 5 months (around 01/24/2024) for HTN/HLD, FIBROMYALGIA, ANEMIA, THYROID, GERD.

## 2023-09-16 ENCOUNTER — Encounter: Payer: Self-pay | Admitting: Nurse Practitioner

## 2023-10-01 ENCOUNTER — Encounter: Payer: Self-pay | Admitting: Nurse Practitioner

## 2023-10-03 ENCOUNTER — Ambulatory Visit: Payer: Medicare (Managed Care) | Admitting: Emergency Medicine

## 2023-10-03 VITALS — BP 138/79 | HR 84 | Ht 64.0 in | Wt 191.4 lb

## 2023-10-03 DIAGNOSIS — Z0000 Encounter for general adult medical examination without abnormal findings: Secondary | ICD-10-CM | POA: Diagnosis not present

## 2023-10-03 NOTE — Progress Notes (Signed)
Subjective:   Mary Irwin is a 72 y.o. female who presents for Medicare Annual (Subsequent) preventive examination.  Visit Complete: In person  Patient Medicare AWV questionnaire was completed by the patient on 10/02/23; I have confirmed that all information answered by patient is correct and no changes since this date.  Cardiac Risk Factors include: advanced age (>84men, >17 women);hypertension;dyslipidemia;obesity (BMI >30kg/m2);Other (see comment), Risk factor comments: A. Fib     Objective:    Today's Vitals   10/02/23 1432 10/03/23 1313  BP:  (!) 166/84  Pulse:  84  SpO2:  96%  Weight:  191 lb 6.4 oz (86.8 kg)  Height:  5\' 4"  (1.626 m)  PainSc: 5     Body mass index is 32.85 kg/m.     10/03/2023    1:38 PM 04/26/2023    1:09 PM 04/26/2023    1:07 PM 01/20/2023    3:00 PM 01/20/2023    2:11 PM 11/21/2022    3:00 PM 09/20/2022    1:10 PM  Advanced Directives  Does Patient Have a Medical Advance Directive? No No No No No No No  Would patient like information on creating a medical advance directive? Yes (MAU/Ambulatory/Procedural Areas - Information given)  No - Guardian declined No - Patient declined No - Guardian declined No - Patient declined No - Patient declined    Current Medications (verified) Outpatient Encounter Medications as of 10/03/2023  Medication Sig   albuterol (PROVENTIL HFA;VENTOLIN HFA) 108 (90 Base) MCG/ACT inhaler Inhale 2 puffs into the lungs every 6 (six) hours as needed for wheezing or shortness of breath.   apixaban (ELIQUIS) 5 MG TABS tablet Take 5 mg by mouth 2 (two) times daily. Am and bedtime   diltiazem (CARDIZEM CD) 300 MG 24 hr capsule Take 300 mg by mouth daily.   DULoxetine (CYMBALTA) 60 MG capsule Take 1 capsule (60 mg total) by mouth daily.   fluticasone furoate-vilanterol (BREO ELLIPTA) 200-25 MCG/ACT AEPB Inhale 1 puff into the lungs daily.   hydrochlorothiazide (HYDRODIURIL) 25 MG tablet Take 25 mg by mouth daily. am    Hyoscyamine Sulfate SL 0.125 MG SUBL Place 0.125 mg under the tongue every 6 (six) hours as needed. Place 1 tablet (0.125 mg total) under the tongue every 6 (six) hours as needed for Cramping   LINZESS 72 MCG capsule Take 72 mcg by mouth every morning.   ondansetron (ZOFRAN-ODT) 4 MG disintegrating tablet Take 4 mg by mouth every 8 (eight) hours as needed for nausea or vomiting.   rosuvastatin (CRESTOR) 10 MG tablet Take 1 tablet (10 mg total) by mouth daily.   tiZANidine (ZANAFLEX) 4 MG tablet Take 1 tablet (4 mg total) by mouth every 6 (six) hours as needed for muscle spasms.   No facility-administered encounter medications on file as of 10/03/2023.    Allergies (verified) Gabapentin, Lisinopril, and Metoclopramide   History: Past Medical History:  Diagnosis Date   Anemia    on iron supplements   Arthritis    all over   Asthma    once a week   Atrial fibrillation (HCC)    Chronic seasonal allergic rhinitis due to pollen    Diverticulosis    Dysrhythmia    Atrial Fibrillation   Fibromyalgia    GERD (gastroesophageal reflux disease)    HLD (hyperlipidemia)    Hypertension    Hypothyroidism    no meds   IBS (irritable bowel syndrome)    PONV (postoperative nausea and vomiting)  Psychophysiological insomnia    PVC's (premature ventricular contractions)    Sinus trouble    Past Surgical History:  Procedure Laterality Date   BACK SURGERY     CATARACT EXTRACTION W/PHACO Right 04/22/2019   Procedure: CATARACT EXTRACTION PHACO AND INTRAOCULAR LENS PLACEMENT (IOC) RIGHT;  Surgeon: Nevada Crane, MD;  Location: [facility];  Service: Ophthalmology;  Laterality: Right;   CATARACT EXTRACTION W/PHACO Left 05/27/2019   Procedure: CATARACT EXTRACTION PHACO AND INTRAOCULAR LENS PLACEMENT (IOC)  LEFT;  Surgeon: Nevada Crane, MD;  Location: [facility];  Service: Ophthalmology;  Laterality: Left;   CHOLECYSTECTOMY N/A 09/22/2017   Procedure: LAPAROSCOPIC  CHOLECYSTECTOMY WITH INTRAOPERATIVE CHOLANGIOGRAM;  Surgeon: Henrene Dodge, MD;  Location: [facility] ORS;  Service: General;  Laterality: N/A;   COLONOSCOPY WITH PROPOFOL N/A 05/23/2017   Procedure: COLONOSCOPY WITH PROPOFOL;  Surgeon: Christena Deem, MD;  Location: [facility] END[facility]COPY;  Service: Endoscopy;  Laterality: N/A;   COLONOSCOPY WITH PROPOFOL N/A 08/18/2021   Procedure: COLONOSCOPY WITH PROPOFOL;  Surgeon: Toledo, Boykin Nearing, MD;  Location: [facility] ENDOSCOPY;  Service: Gastroenterology;  Laterality: N/A;   ESOPHAGOGASTRODUODENOSCOPY (EGD) WITH PROPOFOL N/A 05/23/2017   Procedure: ESOPHAGOGASTRODUODENOSCOPY (EGD) WITH PROPOFOL;  Surgeon: Christena Deem, MD;  Location: [facility] ENDOSCOPY;  Service: Endoscopy;  Laterality: N/A;   ESOPHAGOGASTRODUODENOSCOPY (EGD) WITH PROPOFOL N/A 07/31/2017   Procedure: ESOPHAGOGASTRODUODENOSCOPY (EGD) WITH PROPOFOL;  Surgeon: Christena Deem, MD;  Location: [facility] ENDOSCOPY;  Service: Endoscopy;  Laterality: N/A;   ESOPHAGOGASTRODUODENOSCOPY (EGD) WITH PROPOFOL N/A 08/18/2021   Procedure: ESOPHAGOGASTRODUODENOSCOPY (EGD) WITH PROPOFOL;  Surgeon: Toledo, Boykin Nearing, MD;  Location: [facility] ENDOSCOPY;  Service: Gastroenterology;  Laterality: N/A;   EYE SURGERY     GIVENS CAPSULE STUDY     HERNIA REPAIR     umbilical   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   JOINT REPLACEMENT Bilateral    total hip replacement   REPLACEMENT TOTAL KNEE Right 02/09/2023   Family History  Problem Relation Age of Onset   Hypertension Mother    Dementia Mother    Other Father 72       Sepsis   Breast cancer Maternal Aunt 110   Social History   Socioeconomic History   Marital status: Divorced    Spouse name: Not on file   Number of children: 1   Years of education: Not on file   Highest education level: 12th grade  Occupational History   Occupation: retired  Tobacco Use   Smoking status: Never   Smokeless tobacco: Never  Vaping Use   Vaping status: Never  Used  Substance and Sexual Activity   Alcohol use: No   Drug use: No   Sexual activity: Not Currently  Other Topics Concern   Not on file  Social History Narrative   Not on file   Social Determinants of Health   Financial Resource Strain: Medium Risk (10/02/2023)   Overall Financial Resource Strain (CARDIA)    Difficulty of Paying Living Expenses: Somewhat hard  Food Insecurity: Food Insecurity Present (10/02/2023)   Hunger Vital Sign    Worried About Running Out of Food in the Last Year: Sometimes true    Ran Out of Food in the Last Year: Sometimes true  Transportation Needs: No Transportation Needs (10/02/2023)   PRAPARE - Administrator, Civil Service (Medical): No    Lack of Transportation (Non-Medical): No  Physical Activity: Inactive (10/02/2023)   Exercise Vital Sign    Days of Exercise per  Week: 0 days    Minutes of Exercise per Session: 0 min  Stress: No Stress Concern Present (10/02/2023)   [employer] of Occupational Health - Occupational Stress Questionnaire    Feeling of Stress : Not at all  Social Connections: Socially Isolated (10/02/2023)   Social Connection and Isolation Panel [NHANES]    Frequency of Communication with Friends and Family: More than three times a week    Frequency of Social Gatherings with Friends and Family: More than three times a week    Attends Religious Services: Never    Database administrator or Organizations: No    Attends Engineer, structural: Never    Marital Status: Divorced    Tobacco Counseling Counseling given: Not Answered   Clinical Intake:  Pre-visit preparation completed: Yes  Pain : 0-10 Pain Score: 5  Pain Type: Chronic pain Pain Location: Knee Pain Orientation: Right Pain Descriptors / Indicators: Aching     BMI - recorded: 32.85 Nutritional Status: BMI > 30  Obese Nutritional Risks: None Diabetes: No CBG done?: No  How often do you need to have someone help you when you  read instructions, pamphlets, or other written materials from your doctor or pharmacy?: 1 - Never  Interpreter Needed?: No  Information entered by :: Tora Kindred, CMA   Activities of Daily Living    10/02/2023    2:32 PM 09/15/2023    2:56 PM  In your present state of health, do you have any difficulty performing the following activities:  Hearing? 0 0  Vision? 0 0  Difficulty concentrating or making decisions? 0 0  Walking or climbing stairs? 1 1  Comment due to right knee pain after having knee replacement   Dressing or bathing? 0 0  Doing errands, shopping? 0 0  Preparing Food and eating ? N N  Using the Toilet? N N  In the past six months, have you accidently leaked urine? N N  Do you have problems with loss of bowel control? N N  Managing your Medications? N N  Managing your Finances? N N  Housekeeping or managing your Housekeeping? N N    Patient Care Team: Marjie Skiff, NP as PCP - General (Nurse Practitioner) Earna Coder, MD as Consulting Physician (Hematology and Oncology)  Indicate any recent Medical Services you may have received from other than [facility] providers in the past year (date may be approximate).     Assessment:   This is a routine wellness examination for Celyna.  Hearing/Vision screen Hearing Screening - Comments:: Denies hearing loss Vision Screening - Comments:: Gets eye exams   Goals Addressed               This Visit's Progress     Patient Stated (pt-stated)        Drink more water and lose weight      Depression Screen    10/03/2023    1:35 PM 09/05/2023    1:23 PM 07/25/2023    2:03 PM 10/18/2022    1:17 PM 09/12/2022   12:43 PM 06/20/2022    1:10 PM 12/21/2021   12:13 PM  PHQ 2/9 Scores  PHQ - 2 Score 0 0 0 0 0 0 0  PHQ- 9 Score 0 0 2 2 1 2 2     Fall Risk    10/02/2023    2:32 PM 09/15/2023    2:56 PM 07/25/2023    2:01 PM 10/18/2022    1:17 PM 09/12/2022  12:36 PM  Fall Risk   Falls in the past year? 0  0 1 0 0  Number falls in past yr: 0  0 0 0  Injury with Fall? 0  0 0 0  Risk for fall due to : No Fall Risks  No Fall Risks No Fall Risks   Follow up Falls prevention discussed  Falls evaluation completed Falls evaluation completed Falls evaluation completed;Education provided;Falls prevention discussed    MEDICARE RISK AT HOME: Medicare Risk at Home Any stairs in or around the home?: Yes If so, are there any without handrails?: No Home free of loose throw rugs in walkways, pet beds, electrical cords, etc?: No Adequate lighting in your home to reduce risk of falls?: Yes Life alert?: No Use of a cane, walker or w/c?: No Grab bars in the bathroom?: No Shower chair or bench in shower?: No Elevated toilet seat or a handicapped toilet?: No  TIMED UP AND GO:  Was the test performed?  Yes  Length of time to ambulate 10 feet: 7 sec Gait steady and fast without use of assistive device    Cognitive Function:        10/03/2023    1:39 PM 09/12/2022   12:38 PM 09/10/2021    2:03 PM  6CIT Screen  What Year? 0 points 0 points 0 points  What month? 0 points 0 points 0 points  What time? 0 points 0 points 0 points  Count back from 20 0 points 0 points 0 points  Months in reverse 0 points 0 points 0 points  Repeat phrase 0 points 0 points 2 points  Total Score 0 points 0 points 2 points    Immunizations Immunization History  Administered Date(s) Administered   Fluad Quad(high Dose 65+) 07/24/2019, 08/31/2021, 08/31/2022   Influenza, High Dose Seasonal PF 07/13/2017, 08/10/2018, 08/11/2020   Influenza-Unspecified 10/06/2016, 08/09/2018, 08/11/2020, 07/07/2023   PFIZER Comirnaty(Gray Top)Covid-19 Tri-Sucrose Vaccine 02/06/2020, 02/27/2020, 11/18/2020   PFIZER(Purple Top)SARS-COV-2 Vaccination 02/06/2020, 02/27/2020, 11/18/2020   Pneumococcal Conjugate-13 02/01/2017, 07/13/2017   Pneumococcal Polysaccharide-23 12/10/2018   Tdap 12/26/2016, 01/22/2020   Zoster Recombinant(Shingrix)  01/27/2022, 04/27/2022    TDAP status: Up to date  Flu Vaccine status: Up to date  Pneumococcal vaccine status: Up to date  Covid-19 vaccine status: Declined, Education has been provided regarding the importance of this vaccine but patient still declined. Advised may receive this vaccine at local pharmacy or Health Dept.or vaccine clinic. Aware to provide a copy of the vaccination record if obtained from local pharmacy or Health Dept. Verbalized acceptance and understanding.  Qualifies for Shingles Vaccine? Yes   Zostavax completed No   Shingrix Completed?: Yes  Screening Tests Health Maintenance  Topic Date Due   COVID-19 Vaccine (7 - 2023-24 season) 07/09/2023   Medicare Annual Wellness (AWV)  10/02/2024   DEXA SCAN  02/21/2027   DTaP/Tdap/Td (3 - Td or Tdap) 01/21/2030   Colonoscopy  08/19/2031   Pneumonia Vaccine 32+ Years old  Completed   INFLUENZA VACCINE  Completed   Hepatitis C Screening  Completed   Zoster Vaccines- Shingrix  Completed   HPV VACCINES  Aged Out   MAMMOGRAM  Discontinued    Health Maintenance  Health Maintenance Due  Topic Date Due   COVID-19 Vaccine (7 - 2023-24 season) 07/09/2023    Colorectal cancer screening: Type of screening: Colonoscopy. Completed 08/18/21. Repeat every 10 years  Mammogram Status: patient declined  Bone Density status: Completed 02/20/17. Results reflect: Bone density results: NORMAL. Repeat every  10 years.  Lung Cancer Screening: (Low Dose CT Chest recommended if Age 70-80 years, 20 pack-year currently smoking OR have quit w/in 15years.) does not qualify.   Lung Cancer Screening Referral: n/a  Additional Screening:  Hepatitis C Screening: does not qualify; Completed 06/20/22  Vision Screening: Recommended annual ophthalmology exams for early detection of glaucoma and other disorders of the eye.  Dental Screening: Recommended annual dental exams for proper oral hygiene    Community Resource Referral / Chronic Care  Management: CRR required this visit?  No   CCM required this visit?  No     Plan:     I have personally reviewed and noted the following in the patient's chart:   Medical and social history Use of alcohol, tobacco or illicit drugs  Current medications and supplements including opioid prescriptions. Patient is not currently taking opioid prescriptions. Functional ability and status Nutritional status Physical activity Advanced directives List of other physicians Hospitalizations, surgeries, and ER visits in previous 12 months Vitals Screenings to include cognitive, depression, and falls Referrals and appointments  In addition, I have reviewed and discussed with patient certain preventive protocols, quality metrics, and best practice recommendations. A written personalized care plan for preventive services as well as general preventive health recommendations were provided to patient.     Tora Kindred, CMA   10/03/2023   After Visit Summary: (In Person-Printed) AVS printed and given to the patient  Nurse Notes:  Declined covid vaccine Declined MMG

## 2023-10-03 NOTE — Patient Instructions (Addendum)
Mary Irwin , Thank you for taking time to come for your Medicare Wellness Visit. I appreciate your ongoing commitment to your health goals. Please review the following plan we discussed and let me know if I can assist you in the future.   Referrals/Orders/Follow-Ups/Clinician Recommendations: Work on exercising more and losing weight.  This is a list of the screening recommended for you and due dates:  Health Maintenance  Topic Date Due   COVID-19 Vaccine (7 - 2023-24 season) 07/09/2023   Medicare Annual Wellness Visit  10/02/2024   DEXA scan (bone density measurement)  02/21/2027   DTaP/Tdap/Td vaccine (3 - Td or Tdap) 01/21/2030   Colon Cancer Screening  08/19/2031   Pneumonia Vaccine  Completed   Flu Shot  Completed   Hepatitis C Screening  Completed   Zoster (Shingles) Vaccine  Completed   HPV Vaccine  Aged Out   Mammogram  Discontinued    Advanced directives: (Provided) Advance directive discussed with you today. I have provided a copy for you to complete at home and have notarized. Once this is complete, please bring a copy in to our office so we can scan it into your chart.   Next Medicare Annual Wellness Visit scheduled for next year: Yes, 10/08/24 @ 1:10pm

## 2023-10-08 ENCOUNTER — Other Ambulatory Visit: Payer: Self-pay | Admitting: Nurse Practitioner

## 2023-10-09 ENCOUNTER — Encounter: Payer: Self-pay | Admitting: Nurse Practitioner

## 2023-10-09 ENCOUNTER — Other Ambulatory Visit: Payer: Self-pay

## 2023-10-09 MED ORDER — LINZESS 72 MCG PO CAPS
72.0000 ug | ORAL_CAPSULE | Freq: Every morning | ORAL | 12 refills | Status: DC
  Filled 2023-10-09: qty 30, 30d supply, fill #0

## 2023-10-27 ENCOUNTER — Other Ambulatory Visit: Payer: Medicare (Managed Care)

## 2023-10-27 ENCOUNTER — Ambulatory Visit: Payer: Medicare (Managed Care)

## 2023-10-27 ENCOUNTER — Ambulatory Visit: Payer: Medicare (Managed Care) | Admitting: Internal Medicine

## 2023-10-30 ENCOUNTER — Ambulatory Visit: Payer: Medicare (Managed Care) | Admitting: Internal Medicine

## 2023-10-30 ENCOUNTER — Ambulatory Visit: Payer: Medicare (Managed Care)

## 2023-10-30 ENCOUNTER — Other Ambulatory Visit: Payer: Medicare (Managed Care)

## 2023-11-06 ENCOUNTER — Encounter: Payer: Self-pay | Admitting: Nurse Practitioner

## 2023-11-14 ENCOUNTER — Ambulatory Visit: Payer: Medicare (Managed Care) | Admitting: Internal Medicine

## 2023-11-14 ENCOUNTER — Other Ambulatory Visit: Payer: Medicare (Managed Care)

## 2023-11-14 ENCOUNTER — Ambulatory Visit: Payer: Medicare (Managed Care)

## 2023-11-22 ENCOUNTER — Telehealth: Payer: Self-pay | Admitting: Internal Medicine

## 2023-11-22 NOTE — Telephone Encounter (Signed)
 Left patient a voicemail with changes to appointment asking her to call back if she needed assistance with changing them to a better day/time.

## 2023-12-05 ENCOUNTER — Encounter: Payer: Self-pay | Admitting: Nurse Practitioner

## 2023-12-12 ENCOUNTER — Inpatient Hospital Stay: Payer: Medicare (Managed Care) | Attending: Nurse Practitioner

## 2023-12-12 ENCOUNTER — Other Ambulatory Visit: Payer: Medicare (Managed Care)

## 2023-12-12 ENCOUNTER — Inpatient Hospital Stay: Payer: Medicare (Managed Care)

## 2023-12-12 ENCOUNTER — Ambulatory Visit: Payer: Medicare (Managed Care) | Admitting: Internal Medicine

## 2023-12-12 ENCOUNTER — Inpatient Hospital Stay (HOSPITAL_BASED_OUTPATIENT_CLINIC_OR_DEPARTMENT_OTHER): Payer: Medicare (Managed Care) | Admitting: Nurse Practitioner

## 2023-12-12 ENCOUNTER — Ambulatory Visit: Payer: Medicare (Managed Care)

## 2023-12-12 ENCOUNTER — Encounter: Payer: Self-pay | Admitting: Nurse Practitioner

## 2023-12-12 VITALS — BP 157/52 | HR 88 | Temp 97.8°F | Wt 195.0 lb

## 2023-12-12 VITALS — BP 152/72 | HR 85 | Temp 96.7°F | Resp 19

## 2023-12-12 DIAGNOSIS — Z79899 Other long term (current) drug therapy: Secondary | ICD-10-CM | POA: Insufficient documentation

## 2023-12-12 DIAGNOSIS — I10 Essential (primary) hypertension: Secondary | ICD-10-CM | POA: Diagnosis not present

## 2023-12-12 DIAGNOSIS — I4891 Unspecified atrial fibrillation: Secondary | ICD-10-CM | POA: Diagnosis not present

## 2023-12-12 DIAGNOSIS — M797 Fibromyalgia: Secondary | ICD-10-CM | POA: Insufficient documentation

## 2023-12-12 DIAGNOSIS — D509 Iron deficiency anemia, unspecified: Secondary | ICD-10-CM | POA: Diagnosis present

## 2023-12-12 DIAGNOSIS — R011 Cardiac murmur, unspecified: Secondary | ICD-10-CM | POA: Insufficient documentation

## 2023-12-12 DIAGNOSIS — Z7901 Long term (current) use of anticoagulants: Secondary | ICD-10-CM | POA: Diagnosis not present

## 2023-12-12 DIAGNOSIS — Z803 Family history of malignant neoplasm of breast: Secondary | ICD-10-CM | POA: Diagnosis not present

## 2023-12-12 DIAGNOSIS — E538 Deficiency of other specified B group vitamins: Secondary | ICD-10-CM | POA: Diagnosis not present

## 2023-12-12 DIAGNOSIS — K589 Irritable bowel syndrome without diarrhea: Secondary | ICD-10-CM | POA: Diagnosis not present

## 2023-12-12 DIAGNOSIS — E785 Hyperlipidemia, unspecified: Secondary | ICD-10-CM | POA: Diagnosis not present

## 2023-12-12 DIAGNOSIS — E039 Hypothyroidism, unspecified: Secondary | ICD-10-CM | POA: Insufficient documentation

## 2023-12-12 DIAGNOSIS — K219 Gastro-esophageal reflux disease without esophagitis: Secondary | ICD-10-CM | POA: Diagnosis not present

## 2023-12-12 DIAGNOSIS — R5383 Other fatigue: Secondary | ICD-10-CM | POA: Insufficient documentation

## 2023-12-12 DIAGNOSIS — Z7951 Long term (current) use of inhaled steroids: Secondary | ICD-10-CM | POA: Insufficient documentation

## 2023-12-12 LAB — VITAMIN B12: Vitamin B-12: 258 pg/mL (ref 180–914)

## 2023-12-12 LAB — CBC WITH DIFFERENTIAL (CANCER CENTER ONLY)
Abs Immature Granulocytes: 0.04 10*3/uL (ref 0.00–0.07)
Basophils Absolute: 0.1 10*3/uL (ref 0.0–0.1)
Basophils Relative: 1 %
Eosinophils Absolute: 0.3 10*3/uL (ref 0.0–0.5)
Eosinophils Relative: 4 %
HCT: 35.2 % — ABNORMAL LOW (ref 36.0–46.0)
Hemoglobin: 10.1 g/dL — ABNORMAL LOW (ref 12.0–15.0)
Immature Granulocytes: 0 %
Lymphocytes Relative: 11 %
Lymphs Abs: 1 10*3/uL (ref 0.7–4.0)
MCH: 22.1 pg — ABNORMAL LOW (ref 26.0–34.0)
MCHC: 28.7 g/dL — ABNORMAL LOW (ref 30.0–36.0)
MCV: 77.2 fL — ABNORMAL LOW (ref 80.0–100.0)
Monocytes Absolute: 0.6 10*3/uL (ref 0.1–1.0)
Monocytes Relative: 7 %
Neutro Abs: 6.9 10*3/uL (ref 1.7–7.7)
Neutrophils Relative %: 77 %
Platelet Count: 434 10*3/uL — ABNORMAL HIGH (ref 150–400)
RBC: 4.56 MIL/uL (ref 3.87–5.11)
RDW: 16.4 % — ABNORMAL HIGH (ref 11.5–15.5)
WBC Count: 8.9 10*3/uL (ref 4.0–10.5)
nRBC: 0 % (ref 0.0–0.2)

## 2023-12-12 LAB — IRON AND TIBC
Iron: 34 ug/dL (ref 28–170)
Saturation Ratios: 5 % — ABNORMAL LOW (ref 10.4–31.8)
TIBC: 703 ug/dL — ABNORMAL HIGH (ref 250–450)
UIBC: 669 ug/dL

## 2023-12-12 LAB — FERRITIN: Ferritin: 8 ng/mL — ABNORMAL LOW (ref 11–307)

## 2023-12-12 MED ORDER — IRON SUCROSE 20 MG/ML IV SOLN
200.0000 mg | Freq: Once | INTRAVENOUS | Status: AC
  Administered 2023-12-12: 200 mg via INTRAVENOUS
  Filled 2023-12-12: qty 10

## 2023-12-12 MED ORDER — CYANOCOBALAMIN 1000 MCG/ML IJ SOLN
1000.0000 ug | Freq: Once | INTRAMUSCULAR | Status: AC
  Administered 2023-12-12: 1000 ug via INTRAMUSCULAR
  Filled 2023-12-12: qty 1

## 2023-12-12 MED ORDER — SODIUM CHLORIDE 0.9% FLUSH
10.0000 mL | Freq: Once | INTRAVENOUS | Status: AC | PRN
  Administered 2023-12-12: 10 mL
  Filled 2023-12-12: qty 10

## 2023-12-12 NOTE — Addendum Note (Signed)
Addended by: Clydia Llano on: 12/12/2023 04:20 PM   Modules accepted: Orders

## 2023-12-12 NOTE — Progress Notes (Signed)
 [facility] CONSULT NOTE  Patient Care Team: Valerio Melanie DASEN, NP as PCP - General (Nurse Practitioner) Rennie Cindy SAUNDERS, MD as Consulting Physician (Hematology and Oncology) Mevelyn Romero KATHEE DEVONNA (Gastroenterology) Ammon Blunt, MD as Consulting Physician (Cardiology) Theotis Lavelle BRAVO, MD as Referring Physician (Pulmonary Disease) Myrna Adine Anes, MD as Consulting Physician (Ophthalmology)  CHIEF COMPLAINTS/PURPOSE OF CONSULTATION: Anemia iron  deficiency/B12 deficiency  #Anemia-iron /B12 deficiency [LA, NP-July 2022]-s/p EGD colonoscopy [OCT 2022; Dr.Toledo]; hx of capsule [in last 5 years] [JAN 2021]  # A.fib on eliquis.   HISTORY OF PRESENTING ILLNESS: Alone; ambulating independently.   Mary Irwin 73 y.o.  female with a history of B12/iron  deficiency of unclear etiology is here for follow-up. She's fatigued, can fall asleep when sititng down. Reports dizziness at times. Denies black or bloody stools. Has been caring for her mother post back surgery & dementia.    Review of Systems  Constitutional:  Positive for malaise/fatigue. Negative for chills, diaphoresis, fever and weight loss.  HENT:  Negative for nosebleeds and sore throat.   Eyes:  Negative for double vision.  Respiratory:  Negative for cough, hemoptysis, sputum production, shortness of breath and wheezing.   Cardiovascular:  Negative for chest pain, palpitations, orthopnea and leg swelling.  Gastrointestinal:  Negative for abdominal pain, blood in stool, constipation, diarrhea, heartburn, melena, nausea and vomiting.  Genitourinary:  Negative for dysuria, flank pain, frequency, hematuria and urgency.       Denies vaginal bleeding.  Musculoskeletal:  Positive for back pain and joint pain. Negative for falls.  Skin: Negative.  Negative for itching and rash.  Neurological:  Negative for dizziness, tingling, focal weakness, weakness and headaches.  Endo/Heme/Allergies:  Does not  bruise/bleed easily.  Psychiatric/Behavioral:  Negative for depression. The patient is not nervous/anxious and does not have insomnia.      MEDICAL HISTORY:  Past Medical History:  Diagnosis Date   Anemia    on iron  supplements   Arthritis    all over   Asthma    once a week   Atrial fibrillation (HCC)    Chronic seasonal allergic rhinitis due to pollen    Diverticulosis    Dysrhythmia    Atrial Fibrillation   Fibromyalgia    GERD (gastroesophageal reflux disease)    HLD (hyperlipidemia)    Hypertension    Hypothyroidism    no meds   IBS (irritable bowel syndrome)    PONV (postoperative nausea and vomiting)    Psychophysiological insomnia    PVC's (premature ventricular contractions)    Sinus trouble     SURGICAL HISTORY: Past Surgical History:  Procedure Laterality Date   BACK SURGERY     CATARACT EXTRACTION W/PHACO Right 04/22/2019   Procedure: CATARACT EXTRACTION PHACO AND INTRAOCULAR LENS PLACEMENT (IOC) RIGHT;  Surgeon: Myrna Adine Anes, MD;  Location: [facility];  Service: Ophthalmology;  Laterality: Right;   CATARACT EXTRACTION W/PHACO Left 05/27/2019   Procedure: CATARACT EXTRACTION PHACO AND INTRAOCULAR LENS PLACEMENT (IOC)  LEFT;  Surgeon: Myrna Adine Anes, MD;  Location: [facility];  Service: Ophthalmology;  Laterality: Left;   CHOLECYSTECTOMY N/A 09/22/2017   Procedure: LAPAROSCOPIC CHOLECYSTECTOMY WITH INTRAOPERATIVE CHOLANGIOGRAM;  Surgeon: Desiderio Schanz, MD;  Location: [facility] ORS;  Service: General;  Laterality: N/A;   COLONOSCOPY WITH PROPOFOL  N/A 05/23/2017   Procedure: COLONOSCOPY WITH PROPOFOL ;  Surgeon: Gaylyn Gladis PENNER, MD;  Location: [facility];  Service: Endoscopy;  Laterality: N/A;   COLONOSCOPY WITH PROPOFOL  N/A 08/18/2021   Procedure: COLONOSCOPY WITH  PROPOFOL ;  Surgeon: Toledo, Ladell POUR, MD;  Location: [facility] ENDOSCOPY;  Service: Gastroenterology;  Laterality: N/A;   ESOPHAGOGASTRODUODENOSCOPY (EGD) WITH PROPOFOL  N/A  05/23/2017   Procedure: ESOPHAGOGASTRODUODENOSCOPY (EGD) WITH PROPOFOL ;  Surgeon: Gaylyn Gladis PENNER, MD;  Location: [facility] ENDOSCOPY;  Service: Endoscopy;  Laterality: N/A;   ESOPHAGOGASTRODUODENOSCOPY (EGD) WITH PROPOFOL  N/A 07/31/2017   Procedure: ESOPHAGOGASTRODUODENOSCOPY (EGD) WITH PROPOFOL ;  Surgeon: Gaylyn Gladis PENNER, MD;  Location: [facility] ENDOSCOPY;  Service: Endoscopy;  Laterality: N/A;   ESOPHAGOGASTRODUODENOSCOPY (EGD) WITH PROPOFOL  N/A 08/18/2021   Procedure: ESOPHAGOGASTRODUODENOSCOPY (EGD) WITH PROPOFOL ;  Surgeon: Toledo, Ladell POUR, MD;  Location: [facility] ENDOSCOPY;  Service: Gastroenterology;  Laterality: N/A;   EYE SURGERY     GIVENS CAPSULE STUDY     HERNIA REPAIR     umbilical   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   JOINT REPLACEMENT Bilateral    total hip replacement   REPLACEMENT TOTAL KNEE Right 02/09/2023    SOCIAL HISTORY: Social History   Socioeconomic History   Marital status: Divorced    Spouse name: Not on file   Number of children: 1   Years of education: Not on file   Highest education level: 12th grade  Occupational History   Occupation: retired  Tobacco Use   Smoking status: Never   Smokeless tobacco: Never  Vaping Use   Vaping status: Never Used  Substance and Sexual Activity   Alcohol use: No   Drug use: No   Sexual activity: Not Currently  Other Topics Concern   Not on file  Social History Narrative   Not on file   Social Drivers of Health   Financial Resource Strain: Medium Risk (10/02/2023)   Overall Financial Resource Strain (CARDIA)    Difficulty of Paying Living Expenses: Somewhat hard  Food Insecurity: Food Insecurity Present (10/02/2023)   Hunger Vital Sign    Worried About Running Out of Food in the Last Year: Sometimes true    Ran Out of Food in the Last Year: Sometimes true  Transportation Needs: No Transportation Needs (10/02/2023)   PRAPARE - Administrator, Civil Service (Medical): No    Lack  of Transportation (Non-Medical): No  Physical Activity: Inactive (10/02/2023)   Exercise Vital Sign    Days of Exercise per Week: 0 days    Minutes of Exercise per Session: 0 min  Stress: No Stress Concern Present (10/02/2023)   [employer] of Occupational Health - Occupational Stress Questionnaire    Feeling of Stress : Not at all  Social Connections: Socially Isolated (10/02/2023)   Social Connection and Isolation Panel [NHANES]    Frequency of Communication with Friends and Family: More than three times a week    Frequency of Social Gatherings with Friends and Family: More than three times a week    Attends Religious Services: Never    Database Administrator or Organizations: No    Attends Banker Meetings: Never    Marital Status: Divorced  Catering Manager Violence: Not At Risk (10/03/2023)   Humiliation, Afraid, Rape, and Kick questionnaire    Fear of Current or Ex-Partner: No    Emotionally Abused: No    Physically Abused: No    Sexually Abused: No    FAMILY HISTORY: Family History  Problem Relation Age of Onset   Hypertension Mother    Dementia Mother    Other Father 66       Sepsis   Breast cancer Maternal Aunt 58  ALLERGIES:  is allergic to gabapentin , lisinopril , and metoclopramide.  MEDICATIONS:  Current Outpatient Medications  Medication Sig Dispense Refill   albuterol  (PROVENTIL  HFA;VENTOLIN  HFA) 108 (90 Base) MCG/ACT inhaler Inhale 2 puffs into the lungs every 6 (six) hours as needed for wheezing or shortness of breath.     apixaban (ELIQUIS) 5 MG TABS tablet Take 5 mg by mouth 2 (two) times daily. Am and bedtime     diltiazem  (CARDIZEM  CD) 300 MG 24 hr capsule Take 300 mg by mouth daily.     DULoxetine  (CYMBALTA ) 60 MG capsule Take 1 capsule (60 mg total) by mouth daily. 90 capsule 4   fluticasone  furoate-vilanterol (BREO ELLIPTA ) 200-25 MCG/ACT AEPB Inhale 1 puff into the lungs daily. 90 each 4   hydrochlorothiazide (HYDRODIURIL) 25 MG  tablet Take 25 mg by mouth daily. am     Hyoscyamine Sulfate SL 0.125 MG SUBL Place 0.125 mg under the tongue every 6 (six) hours as needed. Place 1 tablet (0.125 mg total) under the tongue every 6 (six) hours as needed for Cramping     LINZESS  72 MCG capsule Take 1 capsule (72 mcg total) by mouth every morning. 30 capsule 12   ondansetron  (ZOFRAN -ODT) 4 MG disintegrating tablet Take 4 mg by mouth every 8 (eight) hours as needed for nausea or vomiting.     rosuvastatin  (CRESTOR ) 10 MG tablet Take 1 tablet (10 mg total) by mouth daily. 90 tablet 4   tiZANidine  (ZANAFLEX ) 4 MG tablet Take 1 tablet (4 mg total) by mouth every 6 (six) hours as needed for muscle spasms. 30 tablet 0   No current facility-administered medications for this visit.     PHYSICAL EXAMINATION: Vitals:   12/12/23 1305  BP: (!) 157/52  Pulse: 88  Temp: 97.8 F (36.6 C)  SpO2: 99%   Filed Weights   12/12/23 1305  Weight: 195 lb (88.5 kg)  Physical Exam Vitals and nursing note reviewed.  HENT:     Head: Normocephalic and atraumatic.     Mouth/Throat:     Pharynx: Oropharynx is clear.  Eyes:     Extraocular Movements: Extraocular movements intact.     Pupils: Pupils are equal, round, and reactive to light.  Cardiovascular:     Rate and Rhythm: Normal rate and regular rhythm.  Pulmonary:     Comments: Decreased breath sounds bilaterally.  Abdominal:     Palpations: Abdomen is soft.  Musculoskeletal:        General: Normal range of motion.     Cervical back: Normal range of motion.  Skin:    General: Skin is warm.  Neurological:     General: No focal deficit present.     Mental Status: She is alert and oriented to person, place, and time.  Psychiatric:        Behavior: Behavior normal.        Judgment: Judgment normal.      LABORATORY DATA:  I have reviewed the data as listed Lab Results  Component Value Date   WBC 8.9 12/12/2023   HGB 10.1 (L) 12/12/2023   HCT 35.2 (L) 12/12/2023   MCV 77.2  (L) 12/12/2023   PLT 434 (H) 12/12/2023   Recent Labs    07/25/23 1436  NA 143  K 4.1  CL 106  CO2 22  GLUCOSE 84  BUN 15  CREATININE 0.67  CALCIUM  10.4*  PROT 7.1  ALBUMIN 4.8  AST 20  ALT 19  ALKPHOS 107  BILITOT 0.2  Iron /TIBC/Ferritin/ %Sat    Component Value Date/Time   IRON  45 04/24/2023 1234   IRON  12 (L) 04/16/2021 1314   TIBC 638 (H) 04/24/2023 1234   TIBC 541 (H) 04/16/2021 1314   FERRITIN 27 04/24/2023 1234   FERRITIN 7 (L) 04/16/2021 1314   IRONPCTSAT 7 (L) 04/24/2023 1234   IRONPCTSAT 2 (LL) 04/16/2021 1314     RADIOGRAPHIC STUDIES: I have personally reviewed the radiological images as listed and agreed with the findings in the report. No results found.  Assessment & Plan:   # Iron  deficient anemia- unclear etiology (see below). Last venofer  08/25/23. June 2024 iron  sat 6%, ferritin 27. Hemoglobin today       # Etiology of iron  deficiency- unclear- s/p EGD/colonoscopy [[facility]; Ms.Woodard; stool occult+] October 2022; if worsening consider capsule study [previously in last 5 years]/CT imaging- FEB 2021-NEG.    # A.fib/heart murmur- on eliquis [Dr. Parashoes]- stable.    # B12 deficiency- b12 170 [2023. jan]. June 2024 - 318. Currently receiving B12 every 2 months with venofer .     # A.fib- on eliquis [Dr.Parashoes]    DISPOSITION: B12 deficiency # Iron  deficient anemia-unclear etiology.  S/p IV Venofer ; JUNE 202 saturation 6%; ferritin- 27. Last venofer  October 2024. Today, hemoglobin 10.1. Worse. Proceed with Venofer .    # Etiology of iron  deficient is unclear s/p EGD/colonoscopy [[facility];Ms.Woodard; stool occult+] October 2022; if worsening consider capsule study [previously in last 5 years]/CT imaging- FEB 2021-NEG.    # A.fib/heart murmur- on eliquis [Dr,Parashoes]- stable.    # B12 deficiency- b12 170 [2023. jan]. continue monthly b12 1000 mcg every 4 weeks.    # A.fib- on eliquis [Dr.Parashoes]    DISPOSITION:  Venofer  x 5 (weekly;  she'll receive first infusion today) B12 today then monthly 3 mo- lab, Dr Rennie, +/- venofer , b12- la   No problem-specific Assessment & Plan notes found for this encounter.  All questions were answered. The patient knows to call the clinic with any problems, questions or concerns.   Mary KANDICE Dawn, NP 12/12/2023

## 2023-12-19 ENCOUNTER — Encounter: Payer: Self-pay | Admitting: Nurse Practitioner

## 2023-12-19 ENCOUNTER — Inpatient Hospital Stay: Payer: Medicare (Managed Care)

## 2023-12-19 VITALS — BP 142/60 | HR 81 | Temp 97.2°F | Resp 18

## 2023-12-19 DIAGNOSIS — D509 Iron deficiency anemia, unspecified: Secondary | ICD-10-CM | POA: Diagnosis not present

## 2023-12-19 MED ORDER — IRON SUCROSE 20 MG/ML IV SOLN
200.0000 mg | Freq: Once | INTRAVENOUS | Status: AC
  Administered 2023-12-19: 200 mg via INTRAVENOUS

## 2023-12-20 ENCOUNTER — Encounter: Payer: Self-pay | Admitting: Nurse Practitioner

## 2023-12-26 ENCOUNTER — Encounter: Payer: Self-pay | Admitting: Nurse Practitioner

## 2023-12-26 ENCOUNTER — Inpatient Hospital Stay: Payer: Medicare PPO

## 2023-12-26 VITALS — BP 162/58 | HR 93 | Resp 18

## 2023-12-26 DIAGNOSIS — D509 Iron deficiency anemia, unspecified: Secondary | ICD-10-CM

## 2023-12-26 MED ORDER — IRON SUCROSE 20 MG/ML IV SOLN
200.0000 mg | Freq: Once | INTRAVENOUS | Status: AC
  Administered 2023-12-26: 200 mg via INTRAVENOUS
  Filled 2023-12-26: qty 10

## 2024-01-02 ENCOUNTER — Inpatient Hospital Stay: Payer: Medicare (Managed Care)

## 2024-01-02 VITALS — BP 153/81 | HR 81 | Temp 97.8°F | Resp 18

## 2024-01-02 DIAGNOSIS — D509 Iron deficiency anemia, unspecified: Secondary | ICD-10-CM

## 2024-01-02 MED ORDER — SODIUM CHLORIDE 0.9% FLUSH
10.0000 mL | Freq: Once | INTRAVENOUS | Status: AC | PRN
  Administered 2024-01-02: 10 mL
  Filled 2024-01-02: qty 10

## 2024-01-02 MED ORDER — IRON SUCROSE 20 MG/ML IV SOLN
200.0000 mg | Freq: Once | INTRAVENOUS | Status: AC
  Administered 2024-01-02: 200 mg via INTRAVENOUS
  Filled 2024-01-02: qty 10

## 2024-01-09 ENCOUNTER — Inpatient Hospital Stay: Payer: Medicare PPO

## 2024-01-18 ENCOUNTER — Encounter: Payer: Self-pay | Admitting: Nurse Practitioner

## 2024-01-19 ENCOUNTER — Inpatient Hospital Stay: Payer: Medicare (Managed Care) | Attending: Internal Medicine

## 2024-01-19 VITALS — BP 141/69 | HR 80 | Temp 98.0°F | Resp 18

## 2024-01-19 DIAGNOSIS — D509 Iron deficiency anemia, unspecified: Secondary | ICD-10-CM | POA: Diagnosis present

## 2024-01-19 DIAGNOSIS — E538 Deficiency of other specified B group vitamins: Secondary | ICD-10-CM | POA: Diagnosis not present

## 2024-01-19 MED ORDER — SODIUM CHLORIDE 0.9% FLUSH
10.0000 mL | Freq: Once | INTRAVENOUS | Status: AC | PRN
  Administered 2024-01-19: 10 mL
  Filled 2024-01-19: qty 10

## 2024-01-19 MED ORDER — CYANOCOBALAMIN 1000 MCG/ML IJ SOLN
1000.0000 ug | Freq: Once | INTRAMUSCULAR | Status: AC
  Administered 2024-01-19: 1000 ug via INTRAMUSCULAR
  Filled 2024-01-19: qty 1

## 2024-01-19 MED ORDER — IRON SUCROSE 20 MG/ML IV SOLN
200.0000 mg | Freq: Once | INTRAVENOUS | Status: AC
  Administered 2024-01-19: 200 mg via INTRAVENOUS

## 2024-01-28 NOTE — Patient Instructions (Signed)

## 2024-01-29 ENCOUNTER — Ambulatory Visit: Payer: Medicare (Managed Care) | Admitting: Nurse Practitioner

## 2024-01-31 ENCOUNTER — Encounter: Payer: Self-pay | Admitting: Nurse Practitioner

## 2024-01-31 ENCOUNTER — Ambulatory Visit (INDEPENDENT_AMBULATORY_CARE_PROVIDER_SITE_OTHER): Payer: Medicare (Managed Care) | Admitting: Nurse Practitioner

## 2024-01-31 VITALS — BP 132/84 | HR 80 | Temp 98.0°F | Ht 64.4 in | Wt 194.6 lb

## 2024-01-31 DIAGNOSIS — I10 Essential (primary) hypertension: Secondary | ICD-10-CM

## 2024-01-31 DIAGNOSIS — D6869 Other thrombophilia: Secondary | ICD-10-CM | POA: Diagnosis not present

## 2024-01-31 DIAGNOSIS — I480 Paroxysmal atrial fibrillation: Secondary | ICD-10-CM

## 2024-01-31 DIAGNOSIS — I700 Atherosclerosis of aorta: Secondary | ICD-10-CM

## 2024-01-31 DIAGNOSIS — E7800 Pure hypercholesterolemia, unspecified: Secondary | ICD-10-CM

## 2024-01-31 DIAGNOSIS — M797 Fibromyalgia: Secondary | ICD-10-CM

## 2024-01-31 DIAGNOSIS — E559 Vitamin D deficiency, unspecified: Secondary | ICD-10-CM

## 2024-01-31 DIAGNOSIS — D509 Iron deficiency anemia, unspecified: Secondary | ICD-10-CM

## 2024-01-31 DIAGNOSIS — J4520 Mild intermittent asthma, uncomplicated: Secondary | ICD-10-CM

## 2024-01-31 DIAGNOSIS — K219 Gastro-esophageal reflux disease without esophagitis: Secondary | ICD-10-CM

## 2024-01-31 DIAGNOSIS — M25552 Pain in left hip: Secondary | ICD-10-CM

## 2024-01-31 MED ORDER — PREDNISONE 10 MG PO TABS: ORAL_TABLET | ORAL | 0 refills | Status: DC

## 2024-01-31 MED ORDER — ONDANSETRON 4 MG PO TBDP: 4.0000 mg | ORAL_TABLET | Freq: Three times a day (TID) | ORAL | 3 refills | Status: DC | PRN

## 2024-01-31 NOTE — Assessment & Plan Note (Signed)
Chronic, ongoing.   Continue Rosuvastatin and adjust dosing as needed. Recheck lipid panel and CMP today.

## 2024-01-31 NOTE — Progress Notes (Signed)
 BP 132/84 (BP Location: Left Arm, Patient Position: Sitting, Cuff Size: Normal)   Pulse 80   Temp 98 F (36.7 C) (Oral)   Ht 5' 4.4" (1.636 m)   Wt 194 lb 9.6 oz (88.3 kg)   SpO2 96%   BMI 32.99 kg/m    Subjective:    Patient ID: Mary Irwin, female    DOB: 09/14/1951, 73 y.o.   MRN: [id number]  HPI: Mary Irwin is a 73 y.o. female  Chief Complaint  Patient presents with   Anemia   Gastroesophageal Reflux   Hyperlipidemia   Hypertension   Hypothyroidism   HYPERTENSION / HYPERLIPIDEMIA + A-FIB Saw cardiology for follow-up on 01/30/24, no changes made.  Taking Eliquis, Cardizem, HCTZ, + Rosuvastatin. Aortic atherosclerosis noted on imaging 01/03/2020.  Satisfied with current treatment? yes Duration of hypertension: chronic BP monitoring frequency: rarely BP range:  BP medication side effects: no Past BP meds: unsure Aspirin: no Recent stressors: no Recurrent headaches: no Visual changes: no Palpitations: no Dyspnea: no Chest pain: no Lower extremity edema: occasional  Dizzy/lightheaded: no The 10-year ASCVD risk score (Arnett DK, et al., 2019) is: 15.7%   Values used to calculate the score:     Age: 89 years     Sex: Female     Is Non-Hispanic African American: No     Diabetic: No     Tobacco smoker: No     Systolic Blood Pressure: 132 mmHg     Is BP treated: Yes     HDL Cholesterol: 63 mg/dL     Total Cholesterol: 171 mg/dL    ANEMIA Last saw hematology on 12/12/23, obtains infusions. Anemia status: controlled Etiology of anemia: iron deficiency Severity of anemia: moderate Fatigue: occasional, if misses infusion Decreased exercise tolerance: no Dyspnea on exertion: no Palpitations: no Bleeding: no Pica: no    GERD Takes Linzess, no heart burn medication.  Last saw GI 12/22/21. Has low Vitamin D and takes supplement. GERD control status: controlled Satisfied with current treatment? yes Heartburn frequency: none Medication side effects: no   Medication compliance: stable Previous GERD medications: none Antacid use frequency:  none lately Duration: none Nature: none Dysphagia: no Odynophagia:  no Hematemesis: no Blood in stool: no EGD: 08/18/21   ASTHMA Taking Breo and Albuterol.  Last visit with them on 01/25/22. Asthma status: stable Satisfied with current treatment?: yes Albuterol/rescue inhaler frequency: occasional Dyspnea frequency: with activity Wheezing frequency: no Cough frequency: occasional Nocturnal symptom frequency: no Limitation of activity: no Current upper respiratory symptoms: no Triggers: unknown Home peak flows: none Last Spirometry: with pulmonary Failed/intolerant to following asthma meds: none Asthma meds in past: Breo Aerochamber/spacer use: no Visits to ER or Urgent Care in past year: no Pneumovax: Up to Date Influenza: Up to Date   FIBROMYALGIA Chronic pain with with Fibromyalgia. Followed with physiatry in past -- last visit 03/11/21, spondylosis lumba. Gabapentin in past, this caused rash.  Is having left hip pain, this started two weeks ago.  Gradually has gotten worse.  Had surgery to this hip 20 years ago. Pain status: stable Satisfied with current treatment?: yes Medication side effects: no Medication compliance: good compliance Duration: chronic Location: currently left hip Quality: sharp, dull, aching, tearing, and throbbing Current pain level: 8/10 Previous pain level: 4/10 Aggravating factors: lifting, movement, walking, and bending Alleviating factors: nothing Previous pain specialty evaluation: no Non-narcotic analgesic meds: no Narcotic contract:no Treatments attempted: rest, heat, APAP, and ibuprofen      01/31/2024  1:49 PM 10/03/2023    1:35 PM 09/05/2023    1:23 PM 07/25/2023    2:03 PM 10/18/2022    1:17 PM  Depression screen PHQ 2/9  Decreased Interest 0 0 0 0 0  Down, Depressed, Hopeless 0 0 0 0 0  PHQ - 2 Score 0 0 0 0 0  Altered sleeping 0 0 0 0 0   Tired, decreased energy 1 0 0 2 2  Change in appetite 0 0 0 0 0  Feeling bad or failure about yourself  0 0 0 0 0  Trouble concentrating 0 0 0 0 0  Moving slowly or fidgety/restless 0 0 0 0 0  Suicidal thoughts 0 0 0 0 0  PHQ-9 Score 1 0 0 2 2  Difficult doing work/chores Not difficult at all Not difficult at all Not difficult at all Not difficult at all Not difficult at all       01/31/2024    1:49 PM 09/05/2023    1:24 PM 07/25/2023    2:04 PM 10/18/2022    1:17 PM  GAD 7 : Generalized Anxiety Score  Nervous, Anxious, on Edge 0 0 0 0  Control/stop worrying 0 0 0 0  Worry too much - different things 0 0 0 0  Trouble relaxing 0 0 0 0  Restless 0 0 0 0  Easily annoyed or irritable 0 0 0 0  Afraid - awful might happen 0 0 0 0  Total GAD 7 Score 0 0 0 0  Anxiety Difficulty Not difficult at all Not difficult at all Not difficult at all Not difficult at all   Relevant past medical, surgical, family and social history reviewed and updated as indicated. Interim medical history since our last visit reviewed. Allergies and medications reviewed and updated.  Review of Systems  Constitutional:  Negative for activity change, appetite change, diaphoresis, fatigue and fever.  Respiratory:  Negative for cough, chest tightness, shortness of breath and wheezing.   Cardiovascular:  Negative for chest pain, palpitations and leg swelling.  Gastrointestinal:  Negative for abdominal distention, abdominal pain, blood in stool, constipation, diarrhea, nausea and vomiting.  Musculoskeletal:  Positive for arthralgias.  Neurological: Negative.   Psychiatric/Behavioral: Negative.      Per HPI unless specifically indicated above     Objective:    BP 132/84 (BP Location: Left Arm, Patient Position: Sitting, Cuff Size: Normal)   Pulse 80   Temp 98 F (36.7 C) (Oral)   Ht 5' 4.4" (1.636 m)   Wt 194 lb 9.6 oz (88.3 kg)   SpO2 96%   BMI 32.99 kg/m   Wt Readings from Last 3 Encounters:  01/31/24  194 lb 9.6 oz (88.3 kg)  12/12/23 195 lb (88.5 kg)  10/03/23 191 lb 6.4 oz (86.8 kg)    Physical Exam Vitals and nursing note reviewed.  Constitutional:      General: She is awake. She is not in acute distress.    Appearance: She is well-developed and well-groomed. She is obese. She is not ill-appearing or toxic-appearing.  HENT:     Head: Normocephalic and atraumatic.     Right Ear: Hearing normal.     Left Ear: Hearing normal.     Nose: Nose normal.  Eyes:     General: Lids are normal.        Right eye: No discharge.        Left eye: No discharge.     Conjunctiva/sclera: Conjunctivae normal.  Pupils: Pupils are equal, round, and reactive to light.  Neck:     Thyroid: No thyromegaly.     Vascular: No carotid bruit.  Cardiovascular:     Rate and Rhythm: Normal rate and regular rhythm.     Heart sounds: Murmur heard.     Systolic murmur is present with a grade of 2/6.     No gallop.  Pulmonary:     Effort: Pulmonary effort is normal. No accessory muscle usage or respiratory distress.     Breath sounds: Normal breath sounds. No decreased breath sounds, wheezing or rhonchi.  Abdominal:     General: Bowel sounds are normal. There is no distension.     Palpations: Abdomen is soft. There is no hepatomegaly.     Tenderness: There is no abdominal tenderness.  Musculoskeletal:     Cervical back: Normal range of motion and neck supple.     Right hip: Normal.     Left hip: Tenderness present. No deformity, bony tenderness or crepitus. Decreased range of motion. Decreased strength.     Right lower leg: Edema (trace) present.     Left lower leg: Edema (trace) present.     Comments: Antalgic gait.  Lymphadenopathy:     Cervical: No cervical adenopathy.  Skin:    General: Skin is warm and dry.  Neurological:     Mental Status: She is alert and oriented to person, place, and time.     Deep Tendon Reflexes: Reflexes are normal and symmetric.     Reflex Scores:      Brachioradialis  reflexes are 2+ on the right side and 2+ on the left side.      Patellar reflexes are 2+ on the right side and 2+ on the left side. Psychiatric:        Attention and Perception: Attention normal.        Mood and Affect: Mood normal.        Speech: Speech normal.        Behavior: Behavior normal. Behavior is cooperative.        Thought Content: Thought content normal.     Results for orders placed or performed in visit on 12/12/23  Vitamin B12   Collection Time: 12/12/23 12:46 PM  Result Value Ref Range   Vitamin B-12 258 180 - 914 pg/mL  Ferritin   Collection Time: 12/12/23 12:51 PM  Result Value Ref Range   Ferritin 8 (L) 11 - 307 ng/mL  Iron and TIBC   Collection Time: 12/12/23 12:51 PM  Result Value Ref Range   Iron 34 28 - 170 ug/dL   TIBC 161 (H) 096 - 045 ug/dL   Saturation Ratios 5 (L) 10.4 - 31.8 %   UIBC 669 ug/dL  CBC with Differential (Cancer Center Only)   Collection Time: 12/12/23 12:51 PM  Result Value Ref Range   WBC Count 8.9 4.0 - 10.5 K/uL   RBC 4.56 3.87 - 5.11 MIL/uL   Hemoglobin 10.1 (L) 12.0 - 15.0 g/dL   HCT 40.9 (L) 81.1 - 91.4 %   MCV 77.2 (L) 80.0 - 100.0 fL   MCH 22.1 (L) 26.0 - 34.0 pg   MCHC 28.7 (L) 30.0 - 36.0 g/dL   RDW 78.2 (H) 95.6 - 21.3 %   Platelet Count 434 (H) 150 - 400 K/uL   nRBC 0.0 0.0 - 0.2 %   Neutrophils Relative % 77 %   Neutro Abs 6.9 1.7 - 7.7 K/uL   Lymphocytes Relative  11 %   Lymphs Abs 1.0 0.7 - 4.0 K/uL   Monocytes Relative 7 %   Monocytes Absolute 0.6 0.1 - 1.0 K/uL   Eosinophils Relative 4 %   Eosinophils Absolute 0.3 0.0 - 0.5 K/uL   Basophils Relative 1 %   Basophils Absolute 0.1 0.0 - 0.1 K/uL   Immature Granulocytes 0 %   Abs Immature Granulocytes 0.04 0.00 - 0.07 K/uL      Assessment & Plan:   Problem List Items Addressed This Visit       Cardiovascular and Mediastinum   Aortic atherosclerosis (HCC)   Ongoing.  Noted on CT 01/03/2020 -- discussed with patient and educated on this.  Continue  Rosuvastatin daily and ASA.      Relevant Orders   Comprehensive metabolic panel   Essential hypertension   Chronic, ongoing.  BP close to goal on recheck today.  Recommend she monitor BP at least a few mornings a week at home and document.  DASH diet at home.  Continue current medication regimen and adjust as needed + collaboration with cardiology.  Labs today: Lipid, CMP.  Return in 6 months.      Paroxysmal atrial fibrillation (HCC) - Primary   Chronic, ongoing, rate controlled.  Followed by cardiology.  Continue this collaboration and current medication regimen as prescribed by them, including Eliquis. Recent notes reviewed.        Respiratory   Asthma without status asthmaticus   Chronic, ongoing, followed by pulmonary with last visit in 2023.  Continue current inhaler regimen and adjust as needed.  Currently no exacerbation.      Relevant Medications   predniSONE (DELTASONE) 10 MG tablet     Digestive   GERD without esophagitis   Chronic, ongoing.  Currently not taking Protonix or Amitriptyline, reports no benefit from this.  Continue Linzess and Zofran as needed, these offer her benefit.  Return to GI as needed.      Relevant Medications   ondansetron (ZOFRAN-ODT) 4 MG disintegrating tablet     Hematopoietic and Hemostatic   Other thrombophilia (HCC)   Secondary to Eliquis and A-Fib.  Continue current medication regimen and adjust as needed.          Other   Fibromyalgia   Chronic, ongoing.  No benefit from Amitriptyline in past and Gabapentin caused a rash.  Duloxetine is offering her benefit, less pain, continue 60 MG.  Educated her on this and side effects.  Continue to collaborate with physiatry as needed.  Discussed with patient that she may benefit referral to pain clinic in future if ongoing discomfort and poor control.      Relevant Medications   predniSONE (DELTASONE) 10 MG tablet   Hyperlipemia   Chronic, ongoing.   Continue Rosuvastatin and adjust dosing  as needed. Recheck lipid panel and CMP today.      Relevant Orders   Lipid Panel w/o Chol/HDL Ratio   Iron deficiency anemia   Chronic, stable, followed by hematology. Recent notes reviewed. Continue collaboration with them and infusions.      Vitamin D deficiency   Chronic, ongoing.  Check level today and recommend she take supplement as ordered.      Other Visit Diagnoses       Left hip pain       Referral to ortho placed, she prefers Dr. Odis Luster.  Steroid taper provided.   Relevant Orders   Ambulatory referral to Orthopedic Surgery        Follow up  plan: Return in about 6 months (around 08/02/2024) for HTN/HLD, PAIN, THYROID CHECK, GERD.

## 2024-01-31 NOTE — Assessment & Plan Note (Signed)
Chronic, ongoing, followed by pulmonary with last visit in 2023.  Continue current inhaler regimen and adjust as needed.  Currently no exacerbation.

## 2024-01-31 NOTE — Assessment & Plan Note (Signed)
Chronic, stable, followed by hematology. Recent notes reviewed. Continue collaboration with them and infusions.

## 2024-01-31 NOTE — Assessment & Plan Note (Signed)
 Chronic, ongoing.  BP close to goal on recheck today.  Recommend she monitor BP at least a few mornings a week at home and document.  DASH diet at home.  Continue current medication regimen and adjust as needed + collaboration with cardiology.  Labs today: Lipid, CMP.  Return in 6 months.

## 2024-01-31 NOTE — Assessment & Plan Note (Signed)
Ongoing.  Noted on CT 01/03/2020 -- discussed with patient and educated on this.  Continue Rosuvastatin daily and ASA.

## 2024-01-31 NOTE — Assessment & Plan Note (Signed)
Secondary to Eliquis and A-Fib.  Continue current medication regimen and adjust as needed.

## 2024-01-31 NOTE — Assessment & Plan Note (Signed)
 Chronic, ongoing.  Currently not taking Protonix or Amitriptyline, reports no benefit from this.  Continue Linzess and Zofran as needed, these offer her benefit.  Return to GI as needed.

## 2024-01-31 NOTE — Assessment & Plan Note (Signed)
 Chronic, ongoing.  No benefit from Amitriptyline in past and Gabapentin caused a rash.  Duloxetine is offering her benefit, less pain, continue 60 MG.  Educated her on this and side effects.  Continue to collaborate with physiatry as needed.  Discussed with patient that she may benefit referral to pain clinic in future if ongoing discomfort and poor control.

## 2024-01-31 NOTE — Assessment & Plan Note (Signed)
Chronic, ongoing.  Check level today and recommend she take supplement as ordered.

## 2024-01-31 NOTE — Assessment & Plan Note (Addendum)
 Chronic, ongoing, rate controlled.  Followed by cardiology.  Continue this collaboration and current medication regimen as prescribed by them, including Eliquis. Recent notes reviewed.

## 2024-02-01 ENCOUNTER — Encounter: Payer: Self-pay | Admitting: Nurse Practitioner

## 2024-02-01 LAB — LIPID PANEL W/O CHOL/HDL RATIO
Cholesterol, Total: 167 mg/dL (ref 100–199)
HDL: 62 mg/dL (ref >39)
LDL Chol Calc (NIH): 86 mg/dL (ref 0–99)
Triglycerides: 107 mg/dL (ref 0–149)
VLDL Cholesterol Cal: 19 mg/dL (ref 5–40)

## 2024-02-01 LAB — COMPREHENSIVE METABOLIC PANEL WITH GFR
ALT: 21 IU/L (ref 0–32)
AST: 22 IU/L (ref 0–40)
Albumin: 4.6 g/dL (ref 3.8–4.8)
Alkaline Phosphatase: 123 IU/L — ABNORMAL HIGH (ref 44–121)
BUN/Creatinine Ratio: 20 (ref 12–28)
BUN: 14 mg/dL (ref 8–27)
Bilirubin Total: <0.2 mg/dL (ref 0.0–1.2)
CO2: 24 mmol/L (ref 20–29)
Calcium: 10.3 mg/dL (ref 8.7–10.3)
Chloride: 103 mmol/L (ref 96–106)
Creatinine, Ser: 0.7 mg/dL (ref 0.57–1.00)
Globulin, Total: 2.3 g/dL (ref 1.5–4.5)
Glucose: 93 mg/dL (ref 70–99)
Potassium: 4.4 mmol/L (ref 3.5–5.2)
Sodium: 141 mmol/L (ref 134–144)
Total Protein: 6.9 g/dL (ref 6.0–8.5)
eGFR: 92 mL/min/{1.73_m2} (ref >59)

## 2024-02-01 NOTE — Progress Notes (Signed)
 Contacted via MyChart   Good evening [name], your labs have returned and overall remain stable: - Kidney function, creatinine and eGFR, remains normal, as is liver function, AST and ALT.  - Lipid panel shows stable levels, but would like to see LDL <70. You would benefit from increasing Rosuvastatin to 20 MG if okay with this.  Let me know. Any questions? Keep being amazing!!  Thank you for allowing me to participate in your care.  I appreciate you. Kindest regards, Mary Irwin

## 2024-02-02 ENCOUNTER — Encounter: Payer: Self-pay | Admitting: Nurse Practitioner

## 2024-02-02 MED ORDER — ROSUVASTATIN CALCIUM 20 MG PO TABS: 20.0000 mg | ORAL_TABLET | Freq: Every day | ORAL | 1 refills | Status: AC

## 2024-02-06 ENCOUNTER — Ambulatory Visit: Payer: Self-pay | Admitting: Nurse Practitioner

## 2024-02-09 ENCOUNTER — Inpatient Hospital Stay: Payer: Medicare (Managed Care) | Attending: Internal Medicine

## 2024-02-09 DIAGNOSIS — E538 Deficiency of other specified B group vitamins: Secondary | ICD-10-CM | POA: Insufficient documentation

## 2024-02-09 DIAGNOSIS — D509 Iron deficiency anemia, unspecified: Secondary | ICD-10-CM | POA: Insufficient documentation

## 2024-02-09 DIAGNOSIS — Z79899 Other long term (current) drug therapy: Secondary | ICD-10-CM | POA: Diagnosis not present

## 2024-02-09 MED ORDER — CYANOCOBALAMIN 1000 MCG/ML IJ SOLN
1000.0000 ug | Freq: Once | INTRAMUSCULAR | Status: AC
  Administered 2024-02-09: 1000 ug via INTRAMUSCULAR
  Filled 2024-02-09: qty 1

## 2024-03-05 ENCOUNTER — Encounter: Payer: Self-pay | Admitting: Nurse Practitioner

## 2024-03-08 ENCOUNTER — Encounter: Payer: Self-pay | Admitting: Nurse Practitioner

## 2024-03-12 ENCOUNTER — Encounter: Payer: Self-pay | Admitting: Internal Medicine

## 2024-03-12 ENCOUNTER — Inpatient Hospital Stay (HOSPITAL_BASED_OUTPATIENT_CLINIC_OR_DEPARTMENT_OTHER): Payer: Medicare (Managed Care) | Admitting: Internal Medicine

## 2024-03-12 ENCOUNTER — Inpatient Hospital Stay: Payer: Medicare (Managed Care) | Attending: Internal Medicine

## 2024-03-12 ENCOUNTER — Inpatient Hospital Stay: Payer: Medicare (Managed Care)

## 2024-03-12 VITALS — BP 144/64 | HR 84

## 2024-03-12 VITALS — BP 157/68 | HR 92 | Temp 99.3°F | Resp 16 | Ht 64.4 in | Wt 196.0 lb

## 2024-03-12 DIAGNOSIS — E538 Deficiency of other specified B group vitamins: Secondary | ICD-10-CM

## 2024-03-12 DIAGNOSIS — D509 Iron deficiency anemia, unspecified: Secondary | ICD-10-CM | POA: Diagnosis not present

## 2024-03-12 DIAGNOSIS — I4891 Unspecified atrial fibrillation: Secondary | ICD-10-CM | POA: Insufficient documentation

## 2024-03-12 DIAGNOSIS — Z79899 Other long term (current) drug therapy: Secondary | ICD-10-CM | POA: Insufficient documentation

## 2024-03-12 DIAGNOSIS — Z7901 Long term (current) use of anticoagulants: Secondary | ICD-10-CM | POA: Insufficient documentation

## 2024-03-12 LAB — CBC WITH DIFFERENTIAL (CANCER CENTER ONLY)
Abs Immature Granulocytes: 0.06 10*3/uL (ref 0.00–0.07)
Basophils Absolute: 0.1 10*3/uL (ref 0.0–0.1)
Basophils Relative: 1 %
Eosinophils Absolute: 0.2 10*3/uL (ref 0.0–0.5)
Eosinophils Relative: 2 %
HCT: 39.5 % (ref 36.0–46.0)
Hemoglobin: 12.5 g/dL (ref 12.0–15.0)
Immature Granulocytes: 1 %
Lymphocytes Relative: 10 %
Lymphs Abs: 0.9 10*3/uL (ref 0.7–4.0)
MCH: 27.2 pg (ref 26.0–34.0)
MCHC: 31.6 g/dL (ref 30.0–36.0)
MCV: 85.9 fL (ref 80.0–100.0)
Monocytes Absolute: 0.7 10*3/uL (ref 0.1–1.0)
Monocytes Relative: 8 %
Neutro Abs: 7.1 10*3/uL (ref 1.7–7.7)
Neutrophils Relative %: 78 %
Platelet Count: 374 10*3/uL (ref 150–400)
RBC: 4.6 MIL/uL (ref 3.87–5.11)
RDW: 17.2 % — ABNORMAL HIGH (ref 11.5–15.5)
WBC Count: 9.1 10*3/uL (ref 4.0–10.5)
nRBC: 0 % (ref 0.0–0.2)

## 2024-03-12 LAB — IRON AND TIBC
Iron: 35 ug/dL (ref 28–170)
Saturation Ratios: 7 % — ABNORMAL LOW (ref 10.4–31.8)
TIBC: 483 ug/dL — ABNORMAL HIGH (ref 250–450)
UIBC: 448 ug/dL

## 2024-03-12 LAB — FERRITIN: Ferritin: 34 ng/mL (ref 11–307)

## 2024-03-12 LAB — VITAMIN B12: Vitamin B-12: 252 pg/mL (ref 180–914)

## 2024-03-12 MED ORDER — SODIUM CHLORIDE 0.9% FLUSH
10.0000 mL | Freq: Once | INTRAVENOUS | Status: AC | PRN
  Administered 2024-03-12: 10 mL
  Filled 2024-03-12: qty 10

## 2024-03-12 MED ORDER — CYANOCOBALAMIN 1000 MCG/ML IJ SOLN
1000.0000 ug | Freq: Once | INTRAMUSCULAR | Status: AC
  Administered 2024-03-12: 1000 ug via INTRAMUSCULAR
  Filled 2024-03-12: qty 1

## 2024-03-12 MED ORDER — IRON SUCROSE 20 MG/ML IV SOLN
200.0000 mg | Freq: Once | INTRAVENOUS | Status: AC
  Administered 2024-03-12: 200 mg via INTRAVENOUS
  Filled 2024-03-12: qty 10

## 2024-03-12 NOTE — Assessment & Plan Note (Addendum)
#   Iron  deficient anemia-unclear etiology.  S/p IV Venofer - FEB 2025- IDA s/p venofer .   # Today- Hemoglobin today is 12.5 JUNE 202 saturation 6%; ferritin- 27- .  Proceed with Venofer -monthly along with B12 injections.   #Etiology of iron  deficient is unclear s/p EGD/colonoscopy [[facility];Ms.Woodard; stool occult+] October 2022; if worsening consider capsule study [previously in last 5 years]/CT imaging- FEB 2021-NEG. Defer to GI re: ongoing abdominal discomfort.   # A.fib/heart murmur- on eliquis [Dr,Parashoes]- stable.    # B12 deficiency- b12 170 [2023. jan]. continue monthly b12 1000 mcg every 4 weeks.   # A.fib- on eliquis [Dr.Parashoes]   DISPOSITION: # Venofer  today; B12 injection today  # monthly- B12 injections x 3   # in 4 months- MD;  labs-  [cbc, ferritin, iron  studies; b12]   possible venofer /b12 injection-Dr.B

## 2024-03-12 NOTE — Progress Notes (Signed)
 [facility] CONSULT NOTE  Patient Care Team: Lemar Pyles, NP as PCP - General (Nurse Practitioner) Gwyn Leos, MD as Consulting Physician (Hematology and Oncology) Mirian Ames (Gastroenterology) Percival Brace, MD as Consulting Physician (Cardiology) Adelaida Holts, MD as Referring Physician (Pulmonary Disease) Rosa College, MD as Consulting Physician (Ophthalmology)  CHIEF COMPLAINTS/PURPOSE OF CONSULTATION: Anemia iron  deficiency/B12 deficiency  #Anemia-iron /B12 deficiency [LA, NP-July 2022]-s/p EGD colonoscopy [OCT 2022; Dr.Toledo]; hx of capsule [in last 5 years] [JAN 2021]  # A.fib on eliquis.   HISTORY OF PRESENTING ILLNESS: Alone; ambulating independently.   Mary Irwin 73 y.o.  female with a history of B12/iron  deficiency of unclear etiology is here for follow-up.   Having back and hip pain, had cortisone injection and prednisone  since last visit, sees [facility].   Patient complains of abdominal discomfort.  Chronic.  Chronic dyspnea with exertion.  Otherwise denies any blood in stools or black-colored stools.   Review of Systems  Constitutional:  Positive for malaise/fatigue. Negative for chills, diaphoresis, fever and weight loss.  HENT:  Negative for nosebleeds and sore throat.   Eyes:  Negative for double vision.  Respiratory:  Negative for cough, hemoptysis, sputum production, shortness of breath and wheezing.   Cardiovascular:  Negative for chest pain, palpitations, orthopnea and leg swelling.  Gastrointestinal:  Negative for abdominal pain, blood in stool, constipation, diarrhea, heartburn, melena, nausea and vomiting.  Genitourinary:  Negative for dysuria, frequency and urgency.  Musculoskeletal:  Positive for back pain and joint pain.  Skin: Negative.  Negative for itching and rash.  Neurological:  Negative for dizziness, tingling, focal weakness, weakness and headaches.  Endo/Heme/Allergies:  Does  not bruise/bleed easily.  Psychiatric/Behavioral:  Negative for depression. The patient is not nervous/anxious and does not have insomnia.      MEDICAL HISTORY:  Past Medical History:  Diagnosis Date   Anemia    on iron  supplements   Arthritis    all over   Asthma    once a week   Atrial fibrillation (HCC)    Chronic seasonal allergic rhinitis due to pollen    Diverticulosis    Dysrhythmia    Atrial Fibrillation   Fibromyalgia    GERD (gastroesophageal reflux disease)    HLD (hyperlipidemia)    Hypertension    Hypothyroidism    no meds   IBS (irritable bowel syndrome)    PONV (postoperative nausea and vomiting)    Psychophysiological insomnia    PVC's (premature ventricular contractions)    Sinus trouble     SURGICAL HISTORY: Past Surgical History:  Procedure Laterality Date   BACK SURGERY     CATARACT EXTRACTION W/PHACO Right 04/22/2019   Procedure: CATARACT EXTRACTION PHACO AND INTRAOCULAR LENS PLACEMENT (IOC) RIGHT;  Surgeon: Rosa College, MD;  Location: [facility];  Service: Ophthalmology;  Laterality: Right;   CATARACT EXTRACTION W/PHACO Left 05/27/2019   Procedure: CATARACT EXTRACTION PHACO AND INTRAOCULAR LENS PLACEMENT (IOC)  LEFT;  Surgeon: Rosa College, MD;  Location: [facility];  Service: Ophthalmology;  Laterality: Left;   CHOLECYSTECTOMY N/A 09/22/2017   Procedure: LAPAROSCOPIC CHOLECYSTECTOMY WITH INTRAOPERATIVE CHOLANGIOGRAM;  Surgeon: Emmalene Hare, MD;  Location: [facility] ORS;  Service: General;  Laterality: N/A;   COLONOSCOPY WITH PROPOFOL  N/A 05/23/2017   Procedure: COLONOSCOPY WITH PROPOFOL ;  Surgeon: Deveron Fly, MD;  Location: [facility] ENDOSCOPY;  Service: Endoscopy;  Laterality: N/A;   COLONOSCOPY WITH PROPOFOL  N/A 08/18/2021   Procedure: COLONOSCOPY WITH PROPOFOL ;  Surgeon: Toledo, Alphonsus Jeans, MD;  Location: [facility] ENDOSCOPY;  Service: Gastroenterology;  Laterality: N/A;   ESOPHAGOGASTRODUODENOSCOPY (EGD) WITH PROPOFOL  N/A  05/23/2017   Procedure: ESOPHAGOGASTRODUODENOSCOPY (EGD) WITH PROPOFOL ;  Surgeon: Deveron Fly, MD;  Location: [facility] ENDOSCOPY;  Service: Endoscopy;  Laterality: N/A;   ESOPHAGOGASTRODUODENOSCOPY (EGD) WITH PROPOFOL  N/A 07/31/2017   Procedure: ESOPHAGOGASTRODUODENOSCOPY (EGD) WITH PROPOFOL ;  Surgeon: Deveron Fly, MD;  Location: [facility] ENDOSCOPY;  Service: Endoscopy;  Laterality: N/A;   ESOPHAGOGASTRODUODENOSCOPY (EGD) WITH PROPOFOL  N/A 08/18/2021   Procedure: ESOPHAGOGASTRODUODENOSCOPY (EGD) WITH PROPOFOL ;  Surgeon: Toledo, Alphonsus Jeans, MD;  Location: [facility] ENDOSCOPY;  Service: Gastroenterology;  Laterality: N/A;   EYE SURGERY     GIVENS CAPSULE STUDY     HERNIA REPAIR     umbilical   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   JOINT REPLACEMENT Bilateral    total hip replacement   REPLACEMENT TOTAL KNEE Right 02/09/2023    SOCIAL HISTORY: Social History   Socioeconomic History   Marital status: Divorced    Spouse name: Not on file   Number of children: 1   Years of education: Not on file   Highest education level: 12th grade  Occupational History   Occupation: retired  Tobacco Use   Smoking status: Never   Smokeless tobacco: Never  Vaping Use   Vaping status: Never Used  Substance and Sexual Activity   Alcohol use: No   Drug use: No   Sexual activity: Not Currently  Other Topics Concern   Not on file  Social History Narrative   Not on file   Social Drivers of Health   Financial Resource Strain: Medium Risk (01/27/2024)   Overall Financial Resource Strain (CARDIA)    Difficulty of Paying Living Expenses: Somewhat hard  Food Insecurity: No Food Insecurity (01/27/2024)   Hunger Vital Sign    Worried About Running Out of Food in the Last Year: Never true    Ran Out of Food in the Last Year: Never true  Transportation Needs: No Transportation Needs (01/27/2024)   PRAPARE - Administrator, Civil Service (Medical): No    Lack of Transportation  (Non-Medical): No  Physical Activity: Inactive (01/27/2024)   Exercise Vital Sign    Days of Exercise per Week: 0 days    Minutes of Exercise per Session: 0 min  Stress: No Stress Concern Present (01/27/2024)   [employer] of Occupational Health - Occupational Stress Questionnaire    Feeling of Stress : Not at all  Social Connections: Moderately Isolated (01/27/2024)   Social Connection and Isolation Panel [NHANES]    Frequency of Communication with Friends and Family: More than three times a week    Frequency of Social Gatherings with Friends and Family: Three times a week    Attends Religious Services: 1 to 4 times per year    Active Member of Clubs or Organizations: No    Attends Banker Meetings: Never    Marital Status: Divorced  Catering manager Violence: Not At Risk (10/03/2023)   Humiliation, Afraid, Rape, and Kick questionnaire    Fear of Current or Ex-Partner: No    Emotionally Abused: No    Physically Abused: No    Sexually Abused: No    FAMILY HISTORY: Family History  Problem Relation Age of Onset   Hypertension Mother    Dementia Mother    Other Father 92       Sepsis   Breast cancer Maternal Aunt 8  ALLERGIES:  is allergic to gabapentin , lisinopril , and metoclopramide.  MEDICATIONS:  Current Outpatient Medications  Medication Sig Dispense Refill   albuterol  (PROVENTIL  HFA;VENTOLIN  HFA) 108 (90 Base) MCG/ACT inhaler Inhale 2 puffs into the lungs every 6 (six) hours as needed for wheezing or shortness of breath.     apixaban (ELIQUIS) 5 MG TABS tablet Take 5 mg by mouth 2 (two) times daily. Am and bedtime     diltiazem  (CARDIZEM  CD) 300 MG 24 hr capsule Take 300 mg by mouth daily.     DULoxetine  (CYMBALTA ) 60 MG capsule Take 1 capsule (60 mg total) by mouth daily. 90 capsule 4   fluticasone  furoate-vilanterol (BREO ELLIPTA ) 200-25 MCG/ACT AEPB Inhale 1 puff into the lungs daily. 90 each 4   hydrochlorothiazide (HYDRODIURIL) 25 MG tablet Take  25 mg by mouth daily. am     Hyoscyamine Sulfate SL 0.125 MG SUBL Place 0.125 mg under the tongue every 6 (six) hours as needed. Place 1 tablet (0.125 mg total) under the tongue every 6 (six) hours as needed for Cramping     rosuvastatin  (CRESTOR ) 20 MG tablet Take 1 tablet (20 mg total) by mouth daily. 90 tablet 1   LINZESS  72 MCG capsule Take 1 capsule (72 mcg total) by mouth every morning. (Patient not taking: Reported on 03/12/2024) 30 capsule 12   ondansetron  (ZOFRAN -ODT) 4 MG disintegrating tablet Take 1 tablet (4 mg total) by mouth every 8 (eight) hours as needed for nausea or vomiting. (Patient not taking: Reported on 03/12/2024) 20 tablet 3   No current facility-administered medications for this visit.      Aaron Aas  PHYSICAL EXAMINATION:  Vitals:   03/12/24 1251 03/12/24 1302  BP: (!) 153/80 (!) 157/68  Pulse: 92   Resp: 16   Temp: 99.3 F (37.4 C)   SpO2: 96%    Filed Weights   03/12/24 1251  Weight: 196 lb (88.9 kg)    Physical Exam Vitals and nursing note reviewed.  HENT:     Head: Normocephalic and atraumatic.     Mouth/Throat:     Pharynx: Oropharynx is clear.  Eyes:     Extraocular Movements: Extraocular movements intact.     Pupils: Pupils are equal, round, and reactive to light.  Cardiovascular:     Rate and Rhythm: Normal rate and regular rhythm.  Pulmonary:     Comments: Decreased breath sounds bilaterally.  Abdominal:     Palpations: Abdomen is soft.  Musculoskeletal:        General: Normal range of motion.     Cervical back: Normal range of motion.  Skin:    General: Skin is warm.  Neurological:     General: No focal deficit present.     Mental Status: She is alert and oriented to person, place, and time.  Psychiatric:        Behavior: Behavior normal.        Judgment: Judgment normal.      LABORATORY DATA:  I have reviewed the data as listed Lab Results  Component Value Date   WBC 9.1 03/12/2024   HGB 12.5 03/12/2024   HCT 39.5 03/12/2024    MCV 85.9 03/12/2024   PLT 374 03/12/2024   Recent Labs    07/25/23 1436 01/31/24 1431  NA 143 141  K 4.1 4.4  CL 106 103  CO2 22 24  GLUCOSE 84 93  BUN 15 14  CREATININE 0.67 0.70  CALCIUM  10.4* 10.3  PROT 7.1 6.9  ALBUMIN 4.8  4.6  AST 20 22  ALT 19 21  ALKPHOS 107 123*  BILITOT 0.2 <0.2     RADIOGRAPHIC STUDIES: I have personally reviewed the radiological images as listed and agreed with the findings in the report. No results found.  B12 deficiency # Iron  deficient anemia-unclear etiology.  S/p IV Venofer - FEB 2025- IDA s/p venofer .   # Today- Hemoglobin today is 12.5 JUNE 202 saturation 6%; ferritin- 27- .  Proceed with Venofer -monthly along with B12 injections.   #Etiology of iron  deficient is unclear s/p EGD/colonoscopy [[facility];Ms.Woodard; stool occult+] October 2022; if worsening consider capsule study [previously in last 5 years]/CT imaging- FEB 2021-NEG.   # A.fib/heart murmur- on eliquis [Dr,Parashoes]- stable.    # B12 deficiency- b12 170 [2023. jan]. continue monthly b12 1000 mcg every 4 weeks.   # A.fib- on eliquis [Dr.Parashoes]   DISPOSITION: # Venofer  today; B12 injection today  # monthly- B12 injections x 3   # in 4 months- MD;  labs-  [cbc, ferritin, iron  studies; b12]   possible venofer /b12 injection-Dr.B   All questions were answered. The patient knows to call the clinic with any problems, questions or concerns.    Gwyn Leos, MD 03/12/2024 2:06 PM

## 2024-03-12 NOTE — Progress Notes (Signed)
 Pt fell 02/06/24, fell going in the front door, no injuries.  Fatigue/weakness: NO  Dyspena: YES WHEN SHE EATS Light headedness: OFF AND ON Blood in stool: NO   C/o bloating every time she eats, and hard time breathing.  Having back and hip pain, had cortisone injection and prednisone  since last visit, sees [facility].

## 2024-03-27 ENCOUNTER — Ambulatory Visit: Payer: Self-pay

## 2024-03-27 NOTE — Telephone Encounter (Signed)
  Chief Complaint: cough Symptoms: dry cough, cough, congestion Frequency: constant Pertinent Negatives: Patient denies fever Disposition: [] ED /[x] Urgent Care (no appt availability in office) / [] Appointment(In office/virtual)/ []  [facility]/ [] Home Care/ [] Refused Recommended Disposition /[] [facility]/ []  Follow-up with PCP Additional Notes: Patient with hx of asthma presents with dry cough worsening over the last 3 days. Pt can be heard coughing throughout this call and reports intermittent shortness of breath and chest tightness when she cough. Pt reports using her inhaler yesterday with temporary relief. No appts avail today, RN advising UC for sooner evaluation. Pt is agreeable.  Reason for Disposition  [1] MILD difficulty breathing (e.g., minimal/no SOB at rest, SOB with walking, pulse <100) AND [2] still present when not coughing  Answer Assessment - Initial Assessment Questions 1. ONSET: "When did the cough begin?"      3 days ago  2. SEVERITY: "How bad is the cough today?"      Getting worse  3. SPUTUM: "Describe the color of your sputum" (none, dry cough; clear, white, yellow, green)     None  4. HEMOPTYSIS: "Are you coughing up any blood?" If so ask: "How much?" (flecks, streaks, tablespoons, etc.)     *No Answer* 5. DIFFICULTY BREATHING: "Are you having difficulty breathing?" If Yes, ask: "How bad is it?" (e.g., mild, moderate, severe)    - MILD: No SOB at rest, mild SOB with walking, speaks normally in sentences, can lie down, no retractions, pulse < 100.    - MODERATE: SOB at rest, SOB with minimal exertion and prefers to sit, cannot lie down flat, speaks in phrases, mild retractions, audible wheezing, pulse 100-120.    - SEVERE: Very SOB at rest, speaks in single words, struggling to breathe, sitting hunched forward, retractions, pulse > 120      Mild, when coughing a lot  6. FEVER: "Do you have a fever?" If Yes, ask: "What is your temperature, how  was it measured, and when did it start?"     No  7. CARDIAC HISTORY: "Do you have any history of heart disease?" (e.g., heart attack, congestive heart failure)      History of afib  8. LUNG HISTORY: "Do you have any history of lung disease?"  (e.g., pulmonary embolus, asthma, emphysema)     asthma 9. PE RISK FACTORS: "Do you have a history of blood clots?" (or: recent major surgery, recent prolonged travel, bedridden)     No  10. OTHER SYMPTOMS: "Do you have any other symptoms?" (e.g., runny nose, wheezing, chest pain)       Body aches, chest tightness, nasal congestion  11. PREGNANCY: "Is there any chance you are pregnant?" "When was your last menstrual period?"       *No Answer* 12. TRAVEL: "Have you traveled out of the country in the last month?" (e.g., travel history, exposures)       *No Answer*  Protocols used: Cough - Acute Non-Productive-A-AH

## 2024-03-28 ENCOUNTER — Encounter: Payer: Self-pay | Admitting: Nurse Practitioner

## 2024-03-28 ENCOUNTER — Encounter: Payer: Self-pay | Admitting: Emergency Medicine

## 2024-03-28 ENCOUNTER — Ambulatory Visit (INDEPENDENT_AMBULATORY_CARE_PROVIDER_SITE_OTHER)

## 2024-03-28 ENCOUNTER — Ambulatory Visit: Admission: EM | Admit: 2024-03-28 | Discharge: 2024-03-28 | Disposition: A | Discharge status: Home / Self Care

## 2024-03-28 DIAGNOSIS — R062 Wheezing: Secondary | ICD-10-CM | POA: Diagnosis not present

## 2024-03-28 DIAGNOSIS — J189 Pneumonia, unspecified organism: Secondary | ICD-10-CM

## 2024-03-28 MED ORDER — PREDNISONE 20 MG PO TABS: 40.0000 mg | ORAL_TABLET | Freq: Every day | ORAL | 0 refills | Status: AC

## 2024-03-28 MED ORDER — IPRATROPIUM-ALBUTEROL 0.5-2.5 (3) MG/3ML IN SOLN
3.0000 mL | Freq: Once | RESPIRATORY_TRACT | Status: AC
  Administered 2024-03-28: 3 mL via RESPIRATORY_TRACT

## 2024-03-28 MED ORDER — AZITHROMYCIN 250 MG PO TABS: ORAL_TABLET | ORAL | 0 refills | Status: DC

## 2024-03-28 MED ORDER — ALBUTEROL SULFATE HFA 108 (90 BASE) MCG/ACT IN AERS: 1.0000 | INHALATION_SPRAY | Freq: Four times a day (QID) | RESPIRATORY_TRACT | 3 refills | Status: AC | PRN

## 2024-03-28 MED ORDER — PROMETHAZINE-DM 6.25-15 MG/5ML PO SYRP: 5.0000 mL | ORAL_SOLUTION | Freq: Four times a day (QID) | ORAL | 0 refills | Status: DC | PRN

## 2024-03-28 NOTE — ED Triage Notes (Signed)
 Pt presents with a non-productive cough, congestion and headache x 4 days. Pt has taken OTC cold medication with no relief.

## 2024-03-28 NOTE — Discharge Instructions (Addendum)
 1. Community acquired pneumonia of left lower lobe of lung - DG Chest 2 View x-ray performed in UC shows possible mild hazy opacity to bilateral lower lobe, no significant consolidation noted.  Will treat empirically with azithromycin for possible secondary pneumonia. - ipratropium-albuterol  (DUONEB) 0.5-2.5 (3) MG/3ML nebulizer solution 3 mL completed today for acute wheezing, reevaluation of lungs by auscultation following breathing treatment reveal bilateral lower lobe rhonchi. - albuterol  (VENTOLIN  HFA) 108 (90 Base) MCG/ACT inhaler; Inhale 1-2 puffs into the lungs every 6 (six) hours as needed for wheezing or shortness of breath.  Dispense: 8 g; Refill: 3 - predniSONE  (DELTASONE ) 20 MG tablet; Take 2 tablets (40 mg total) by mouth daily for 5 days.  Dispense: 10 tablet; Refill: 0 - promethazine-dextromethorphan  (PROMETHAZINE-DM) 6.25-15 MG/5ML syrup; Take 5 mLs by mouth 4 (four) times daily as needed for cough.  Dispense: 118 mL; Refill: 0 - azithromycin (ZITHROMAX Z-PAK) 250 MG tablet; Take 500 mg on day 1 followed by 250 mg for 4 days.  Take medication for a total of 5 days.  Dispense: 6 tablet; Refill: 0 -Continue to monitor symptoms for any change in severity if there is any escalation of current symptoms or development of new symptoms follow-up in ER for further evaluation and management.

## 2024-03-28 NOTE — ED Provider Notes (Signed)
 [facility]  Note:  This document was prepared using Conservation officer, historic buildings and may include unintentional dictation errors.  MRN: [id number] DOB: [birth year]  Subjective:   Mary Irwin is a 73 y.o. female presenting for dry cough, nasal congestion, headache, wheezing x 3 to 4 days.  Patient has taken over-the-counter cold medication with minimal improvement.  Patient reports past history of asthma and notes that wheezing is usually worse at night.  Patient denies any known sick contacts.  No shortness of breath, chest pain, weakness, dizziness.  No current facility-administered medications for this encounter.  Current Outpatient Medications:    azithromycin (ZITHROMAX Z-PAK) 250 MG tablet, Take 500 mg on day 1 followed by 250 mg for 4 days.  Take medication for a total of 5 days., Disp: 6 tablet, Rfl: 0   predniSONE  (DELTASONE ) 20 MG tablet, Take 2 tablets (40 mg total) by mouth daily for 5 days., Disp: 10 tablet, Rfl: 0   promethazine-dextromethorphan  (PROMETHAZINE-DM) 6.25-15 MG/5ML syrup, Take 5 mLs by mouth 4 (four) times daily as needed for cough., Disp: 118 mL, Rfl: 0   albuterol  (VENTOLIN  HFA) 108 (90 Base) MCG/ACT inhaler, Inhale 1-2 puffs into the lungs every 6 (six) hours as needed for wheezing or shortness of breath., Disp: 8 g, Rfl: 3   apixaban (ELIQUIS) 5 MG TABS tablet, Take 5 mg by mouth 2 (two) times daily. Am and bedtime, Disp: , Rfl:    diltiazem  (CARDIZEM  CD) 300 MG 24 hr capsule, Take 300 mg by mouth daily., Disp: , Rfl:    DULoxetine  (CYMBALTA ) 60 MG capsule, Take 1 capsule (60 mg total) by mouth daily., Disp: 90 capsule, Rfl: 4   fluticasone  furoate-vilanterol (BREO ELLIPTA ) 200-25 MCG/ACT AEPB, Inhale 1 puff into the lungs daily., Disp: 90 each, Rfl: 4   hydrochlorothiazide (HYDRODIURIL) 25 MG tablet, Take 25 mg by mouth daily. am, Disp: , Rfl:    Hyoscyamine Sulfate SL 0.125 MG SUBL, Place 0.125 mg under the tongue every 6 (six) hours as needed.  Place 1 tablet (0.125 mg total) under the tongue every 6 (six) hours as needed for Cramping, Disp: , Rfl:    LINZESS  72 MCG capsule, Take 1 capsule (72 mcg total) by mouth every morning. (Patient not taking: Reported on 03/12/2024), Disp: 30 capsule, Rfl: 12   ondansetron  (ZOFRAN -ODT) 4 MG disintegrating tablet, Take 1 tablet (4 mg total) by mouth every 8 (eight) hours as needed for nausea or vomiting. (Patient not taking: Reported on 03/12/2024), Disp: 20 tablet, Rfl: 3   rosuvastatin  (CRESTOR ) 20 MG tablet, Take 1 tablet (20 mg total) by mouth daily., Disp: 90 tablet, Rfl: 1   Allergies  Allergen Reactions   Gabapentin  Itching and Rash   Lisinopril  Swelling    After further review this may have been caused by Reglan   Metoclopramide Swelling    Past Medical History:  Diagnosis Date   Anemia    on iron  supplements   Arthritis    all over   Asthma    once a week   Atrial fibrillation (HCC)    Chronic seasonal allergic rhinitis due to pollen    Diverticulosis    Dysrhythmia    Atrial Fibrillation   Fibromyalgia    GERD (gastroesophageal reflux disease)    HLD (hyperlipidemia)    Hypertension    Hypothyroidism    no meds   IBS (irritable bowel syndrome)    PONV (postoperative nausea and vomiting)    Psychophysiological insomnia  PVC's (premature ventricular contractions)    Sinus trouble      Past Surgical History:  Procedure Laterality Date   BACK SURGERY     CATARACT EXTRACTION W/PHACO Right 04/22/2019   Procedure: CATARACT EXTRACTION PHACO AND INTRAOCULAR LENS PLACEMENT (IOC) RIGHT;  Surgeon: Rosa College, MD;  Location: [facility];  Service: Ophthalmology;  Laterality: Right;   CATARACT EXTRACTION W/PHACO Left 05/27/2019   Procedure: CATARACT EXTRACTION PHACO AND INTRAOCULAR LENS PLACEMENT (IOC)  LEFT;  Surgeon: Rosa College, MD;  Location: [facility];  Service: Ophthalmology;  Laterality: Left;   CHOLECYSTECTOMY N/A 09/22/2017   Procedure:  LAPAROSCOPIC CHOLECYSTECTOMY WITH INTRAOPERATIVE CHOLANGIOGRAM;  Surgeon: Emmalene Hare, MD;  Location: [facility] ORS;  Service: General;  Laterality: N/A;   COLONOSCOPY WITH PROPOFOL  N/A 05/23/2017   Procedure: COLONOSCOPY WITH PROPOFOL ;  Surgeon: Deveron Fly, MD;  Location: [facility] END[facility];  Service: Endoscopy;  Laterality: N/A;   COLONOSCOPY WITH PROPOFOL  N/A 08/18/2021   Procedure: COLONOSCOPY WITH PROPOFOL ;  Surgeon: Toledo, Alphonsus Jeans, MD;  Location: [facility] ENDOSCOPY;  Service: Gastroenterology;  Laterality: N/A;   ESOPHAGOGASTRODUODENOSCOPY (EGD) WITH PROPOFOL  N/A 05/23/2017   Procedure: ESOPHAGOGASTRODUODENOSCOPY (EGD) WITH PROPOFOL ;  Surgeon: Deveron Fly, MD;  Location: [facility] ENDOSCOPY;  Service: Endoscopy;  Laterality: N/A;   ESOPHAGOGASTRODUODENOSCOPY (EGD) WITH PROPOFOL  N/A 07/31/2017   Procedure: ESOPHAGOGASTRODUODENOSCOPY (EGD) WITH PROPOFOL ;  Surgeon: Deveron Fly, MD;  Location: [facility] END[facility]COPY;  Service: Endoscopy;  Laterality: N/A;   ESOPHAGOGASTRODUODENOSCOPY (EGD) WITH PROPOFOL  N/A 08/18/2021   Procedure: ESOPHAGOGASTRODUODENOSCOPY (EGD) WITH PROPOFOL ;  Surgeon: Toledo, Alphonsus Jeans, MD;  Location: [facility] ENDOSCOPY;  Service: Gastroenterology;  Laterality: N/A;   EYE SURGERY     GIVENS CAPSULE STUDY     HERNIA REPAIR     umbilical   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   JOINT REPLACEMENT Bilateral    total hip replacement   REPLACEMENT TOTAL KNEE Right 02/09/2023    Family History  Problem Relation Age of Onset   Hypertension Mother    Dementia Mother    Other Father 80       Sepsis   Breast cancer Maternal Aunt 44    Social History   Tobacco Use   Smoking status: Never   Smokeless tobacco: Never  Vaping Use   Vaping status: Never Used  Substance Use Topics   Alcohol use: No   Drug use: No    ROS Refer to HPI for ROS details.  Objective:   Vitals: BP (!) 170/80 (BP Location: Right Wrist)   Pulse 95   Temp 98.7 F (37.1 C)  (Oral)   Resp 18   SpO2 95%   Physical Exam Vitals and nursing note reviewed.  Constitutional:      General: She is not in acute distress.    Appearance: She is well-developed. She is not ill-appearing or toxic-appearing.  HENT:     Head: Normocephalic and atraumatic.     Nose: Congestion and rhinorrhea present.     Mouth/Throat:     Mouth: Mucous membranes are moist.  Eyes:     General:        Right eye: No discharge.        Left eye: No discharge.     Extraocular Movements: Extraocular movements intact.     Conjunctiva/sclera: Conjunctivae normal.  Cardiovascular:     Rate and Rhythm: Normal rate and regular rhythm.     Heart sounds: Normal heart sounds. No murmur heard. Pulmonary:  Effort: Pulmonary effort is normal. No respiratory distress.     Breath sounds: No stridor. Wheezing and rhonchi present. No rales.  Chest:     Chest wall: No tenderness.  Skin:    General: Skin is warm and dry.  Neurological:     General: No focal deficit present.     Mental Status: She is alert and oriented to person, place, and time.  Psychiatric:        Mood and Affect: Mood normal.        Behavior: Behavior normal.     Procedures  No results found for this or any previous visit (from the past 24 hours).  Assessment and Plan :     Discharge Instructions      1. Community acquired pneumonia of left lower lobe of lung - DG Chest 2 View x-ray performed in UC shows possible mild hazy opacity to bilateral lower lobe, no significant consolidation noted.  Will treat empirically with azithromycin for possible secondary pneumonia. - ipratropium-albuterol  (DUONEB) 0.5-2.5 (3) MG/3ML nebulizer solution 3 mL completed today for acute wheezing, reevaluation of lungs by auscultation following breathing treatment reveal bilateral lower lobe rhonchi. - albuterol  (VENTOLIN  HFA) 108 (90 Base) MCG/ACT inhaler; Inhale 1-2 puffs into the lungs every 6 (six) hours as needed for wheezing or  shortness of breath.  Dispense: 8 g; Refill: 3 - predniSONE  (DELTASONE ) 20 MG tablet; Take 2 tablets (40 mg total) by mouth daily for 5 days.  Dispense: 10 tablet; Refill: 0 - promethazine-dextromethorphan  (PROMETHAZINE-DM) 6.25-15 MG/5ML syrup; Take 5 mLs by mouth 4 (four) times daily as needed for cough.  Dispense: 118 mL; Refill: 0 - azithromycin (ZITHROMAX Z-PAK) 250 MG tablet; Take 500 mg on day 1 followed by 250 mg for 4 days.  Take medication for a total of 5 days.  Dispense: 6 tablet; Refill: 0 -Continue to monitor symptoms for any change in severity if there is any escalation of current symptoms or development of new symptoms follow-up in ER for further evaluation and management.    Belford Pascucci B Divonte Senger   Deepti Gunawan, [employer], [place] 03/28/24 1023

## 2024-03-29 ENCOUNTER — Ambulatory Visit: Admitting: Nurse Practitioner

## 2024-03-31 NOTE — Patient Instructions (Signed)

## 2024-04-05 ENCOUNTER — Ambulatory Visit (INDEPENDENT_AMBULATORY_CARE_PROVIDER_SITE_OTHER): Payer: Medicare (Managed Care) | Admitting: Nurse Practitioner

## 2024-04-05 ENCOUNTER — Encounter: Payer: Self-pay | Admitting: Nurse Practitioner

## 2024-04-05 VITALS — BP 142/69 | HR 86 | Temp 98.0°F | Ht 64.5 in | Wt 196.2 lb

## 2024-04-05 DIAGNOSIS — J189 Pneumonia, unspecified organism: Secondary | ICD-10-CM | POA: Insufficient documentation

## 2024-04-05 MED ORDER — AMOXICILLIN-POT CLAVULANATE 875-125 MG PO TABS: 1.0000 | ORAL_TABLET | Freq: Two times a day (BID) | ORAL | 0 refills | Status: AC

## 2024-04-05 NOTE — Progress Notes (Signed)
 BP (!) 142/69   Pulse 86   Temp 98 F (36.7 C) (Oral)   Ht 5' 4.5" (1.638 m)   Wt 196 lb 3.2 oz (89 kg)   SpO2 98%   BMI 33.16 kg/m    Subjective:    Patient ID: Mary Irwin, female    DOB: 1951-09-28, 73 y.o.   MRN: [id number]  HPI: [name] is a 73 y.o. female  Chief Complaint  Patient presents with   UC Follow Up    Diagnosed with pneumonia on 03/28/24, finished Zpack, did not pick up the cough syrup    PNEUMONIA Diagnosed with pneumonia on 03/28/24 at [facility].  Treated with Prednisone  and Zpack + Albuterol  inhaler and cough syrup.  She has completed Zpack and Prednisone . Fever: no Cough: yes Shortness of breath: yes Wheezing: yes Chest pain: no Chest tightness: yes Chest congestion: yes Nasal congestion: no Runny nose: yes Post nasal drip: yes Sneezing: no Sore throat: no Swollen glands: no Sinus pressure: yes Headache: yes Face pain: no Toothache: no Ear pain: yes bilateral Ear pressure: yes bilateral Eyes red/itching:no Eye drainage/crusting: no  Vomiting: no Rash: no Fatigue: yes Sick contacts: no Strep contacts: no  Context: fluctuating Recurrent sinusitis: no Relief with OTC cold/cough medications: not 100%  Treatments attempted: cough syrup and antibiotics and Prednisone     Relevant past medical, surgical, family and social history reviewed and updated as indicated. Interim medical history since our last visit reviewed. Allergies and medications reviewed and updated.  Review of Systems  Constitutional:  Positive for fatigue. Negative for activity change, appetite change, chills and fever.  HENT:  Positive for ear pain, postnasal drip, rhinorrhea and sinus pressure. Negative for congestion, ear discharge, facial swelling, sinus pain, sneezing, sore throat and voice change.   Respiratory:  Positive for cough, chest tightness, shortness of breath and wheezing.   Cardiovascular:  Negative for chest pain, palpitations and leg  swelling.  Gastrointestinal: Negative.   Musculoskeletal:  Positive for myalgias.  Neurological:  Positive for headaches. Negative for dizziness and numbness.  Psychiatric/Behavioral: Negative.     Per HPI unless specifically indicated above     Objective:     BP (!) 142/69   Pulse 86   Temp 98 F (36.7 C) (Oral)   Ht 5' 4.5" (1.638 m)   Wt 196 lb 3.2 oz (89 kg)   SpO2 98%   BMI 33.16 kg/m   Wt Readings from Last 3 Encounters:  04/05/24 196 lb 3.2 oz (89 kg)  03/12/24 196 lb (88.9 kg)  01/31/24 194 lb 9.6 oz (88.3 kg)    Physical Exam Vitals and nursing note reviewed.  Constitutional:      General: She is awake. She is not in acute distress.    Appearance: She is well-developed and well-groomed. She is obese. She is ill-appearing. She is not toxic-appearing.  HENT:     Head: Normocephalic.     Right Ear: Hearing, ear canal and external ear normal. A middle ear effusion is present. Tympanic membrane is not injected or perforated.     Left Ear: Hearing, ear canal and external ear normal. A middle ear effusion is present. Tympanic membrane is not injected or perforated.     Nose: Rhinorrhea present. Rhinorrhea is clear.     Right Sinus: No maxillary sinus tenderness or frontal sinus tenderness.     Left Sinus: No maxillary sinus tenderness or frontal sinus tenderness.     Mouth/Throat:  Mouth: Mucous membranes are moist.     Pharynx: Posterior oropharyngeal erythema (mild) present. No pharyngeal swelling or oropharyngeal exudate.  Eyes:     General: Lids are normal.        Right eye: No discharge.        Left eye: No discharge.     Conjunctiva/sclera: Conjunctivae normal.     Pupils: Pupils are equal, round, and reactive to light.  Neck:     Thyroid : No thyromegaly.     Vascular: No carotid bruit.  Cardiovascular:     Rate and Rhythm: Normal rate and regular rhythm.     Heart sounds: Normal heart sounds. No murmur heard.    No gallop.  Pulmonary:     Effort:  Pulmonary effort is normal. No accessory muscle usage or respiratory distress.     Breath sounds: Wheezing (expiratory throughout all lung fields) and rales present. No decreased breath sounds.  Abdominal:     General: Bowel sounds are normal.     Palpations: Abdomen is soft. There is no hepatomegaly or splenomegaly.  Musculoskeletal:     Cervical back: Normal range of motion and neck supple.     Right lower leg: No edema.     Left lower leg: No edema.  Lymphadenopathy:     Head:     Right side of head: Tonsillar adenopathy present. No submental, submandibular, preauricular or posterior auricular adenopathy.     Left side of head: Tonsillar adenopathy present. No submental, submandibular, preauricular or posterior auricular adenopathy.     Cervical: No cervical adenopathy.  Skin:    General: Skin is warm and dry.  Neurological:     Mental Status: She is alert and oriented to person, place, and time.  Psychiatric:        Attention and Perception: Attention normal.        Mood and Affect: Mood normal.        Speech: Speech normal.        Behavior: Behavior normal. Behavior is cooperative.        Thought Content: Thought content normal.     Results for orders placed or performed in visit on 03/12/24  Vitamin B12   Collection Time: 03/12/24 12:40 PM  Result Value Ref Range   Vitamin B-12 252 180 - 914 pg/mL  Ferritin   Collection Time: 03/12/24 12:42 PM  Result Value Ref Range   Ferritin 34 11 - 307 ng/mL  Iron  and TIBC   Collection Time: 03/12/24 12:42 PM  Result Value Ref Range   Iron  35 28 - 170 ug/dL   TIBC 403 (H) 474 - 259 ug/dL   Saturation Ratios 7 (L) 10.4 - 31.8 %   UIBC 448 ug/dL  CBC with Differential (Cancer Center Only)   Collection Time: 03/12/24 12:42 PM  Result Value Ref Range   WBC Count 9.1 4.0 - 10.5 K/uL   RBC 4.60 3.87 - 5.11 MIL/uL   Hemoglobin 12.5 12.0 - 15.0 g/dL   HCT 56.3 87.5 - 64.3 %   MCV 85.9 80.0 - 100.0 fL   MCH 27.2 26.0 - 34.0 pg    MCHC 31.6 30.0 - 36.0 g/dL   RDW 32.9 (H) 51.8 - 84.1 %   Platelet Count 374 150 - 400 K/uL   nRBC 0.0 0.0 - 0.2 %   Neutrophils Relative % 78 %   Neutro Abs 7.1 1.7 - 7.7 K/uL   Lymphocytes Relative 10 %   Lymphs Abs 0.9 0.7 -  4.0 K/uL   Monocytes Relative 8 %   Monocytes Absolute 0.7 0.1 - 1.0 K/uL   Eosinophils Relative 2 %   Eosinophils Absolute 0.2 0.0 - 0.5 K/uL   Basophils Relative 1 %   Basophils Absolute 0.1 0.0 - 0.1 K/uL   Immature Granulocytes 1 %   Abs Immature Granulocytes 0.06 0.00 - 0.07 K/uL      Assessment & Plan:   Problem List Items Addressed This Visit       Respiratory   Pneumonia - Primary   Acute and diagnosed on 03/28/24.  Completed Zpack and Prednisone .  Still not 100%.  Will start Augmentin  BID for 7 days for CAP.  Recommend she continue to use Albuterol  and pick up cough medication.  Recommend: - Increased rest - Increasing Fluids - Acetaminophen  as needed for fever/pain.  - Salt water gargling, chloraseptic spray and throat lozenges - Mucinex .  - Humidifying the air.       Relevant Medications   amoxicillin -clavulanate (AUGMENTIN ) 875-125 MG tablet     Follow up plan: Return in about 1 week (around 04/12/2024) for Pneumonia.

## 2024-04-05 NOTE — Assessment & Plan Note (Signed)
 Acute and diagnosed on 03/28/24.  Completed Zpack and Prednisone .  Still not 100%.  Will start Augmentin BID for 7 days for CAP.  Recommend she continue to use Albuterol  and pick up cough medication.  Recommend: - Increased rest - Increasing Fluids - Acetaminophen  as needed for fever/pain.  - Salt water gargling, chloraseptic spray and throat lozenges - Mucinex .  - Humidifying the air.

## 2024-04-07 NOTE — Patient Instructions (Incomplete)

## 2024-04-12 ENCOUNTER — Inpatient Hospital Stay: Payer: Medicare (Managed Care) | Attending: Internal Medicine

## 2024-04-12 ENCOUNTER — Ambulatory Visit: Admitting: Nurse Practitioner

## 2024-04-12 VITALS — BP 146/74 | HR 78

## 2024-04-12 DIAGNOSIS — D509 Iron deficiency anemia, unspecified: Secondary | ICD-10-CM

## 2024-04-12 DIAGNOSIS — E538 Deficiency of other specified B group vitamins: Secondary | ICD-10-CM | POA: Diagnosis present

## 2024-04-12 MED ORDER — CYANOCOBALAMIN 1000 MCG/ML IJ SOLN
1000.0000 ug | Freq: Once | INTRAMUSCULAR | Status: AC
  Administered 2024-04-12: 1000 ug via INTRAMUSCULAR

## 2024-04-14 NOTE — Patient Instructions (Signed)

## 2024-04-16 ENCOUNTER — Encounter: Payer: Self-pay | Admitting: Nurse Practitioner

## 2024-04-16 ENCOUNTER — Ambulatory Visit (INDEPENDENT_AMBULATORY_CARE_PROVIDER_SITE_OTHER): Payer: Medicare (Managed Care) | Admitting: Nurse Practitioner

## 2024-04-16 VITALS — BP 140/72 | HR 85 | Temp 98.0°F | Ht 64.5 in | Wt 196.0 lb

## 2024-04-16 DIAGNOSIS — J189 Pneumonia, unspecified organism: Secondary | ICD-10-CM | POA: Diagnosis not present

## 2024-04-16 NOTE — Assessment & Plan Note (Signed)
 Acute and improving.  Continues to have some sinus symptoms, recommend to avoid sinus medications or medications that state D on them as suspect this elevated BP today.  Recommend taking OTC Claritin or Zyrtec daily for the next 7-10 days to assist with remaining sinus issues.  Overall exam improved today.

## 2024-04-16 NOTE — Progress Notes (Signed)
 BP (!) 140/72 (BP Location: Left Arm, Patient Position: Sitting, Cuff Size: Normal)   Pulse 85   Temp 98 F (36.7 C) (Oral)   Ht 5' 4.5" (1.638 m)   Wt 196 lb (88.9 kg)   SpO2 97%   BMI 33.12 kg/m    Subjective:    Patient ID: Mary Irwin, female    DOB: April 30, 1951, 73 y.o.   MRN: [id number]  HPI: Mary Irwin is a 73 y.o. female  Chief Complaint  Patient presents with   Pneumonia    1 week f/up- patient states she is feeling better. States she had an episode on Friday of nausea and not feeling well but states these symptoms went away on Saturday   PNEUMONIA Follow-up for pneumonia today.  Was seen on 04/05/24 for this.  Treated with Augmentin  at that visit and prior to this was treated with Zpack.  Is starting to feel better.  Did take sinus medication two days ago. Fever: no Cough: yes mild Shortness of breath: no Wheezing: no Chest pain: no Chest tightness: no Chest congestion: no Nasal congestion: no Runny nose: yes Post nasal drip: yes Sneezing: no Sore throat: yes Swollen glands: no Sinus pressure: yes Headache: yes Face pain: no Toothache: no Ear pain: yes bilateral Ear pressure: yes bilateral Eyes red/itching:no Eye drainage/crusting: no  Vomiting: no Rash: no Fatigue: yes Sick contacts: no Strep contacts: no  Context: better Recurrent sinusitis: no Relief with OTC cold/cough medications: yes  Treatments attempted: cold/sinus, mucinex , anti-histamine, and antibiotics    Relevant past medical, surgical, family and social history reviewed and updated as indicated. Interim medical history since our last visit reviewed. Allergies and medications reviewed and updated.  Review of Systems  Constitutional:  Positive for fatigue. Negative for activity change, appetite change, chills and fever.  HENT:  Positive for ear pain, postnasal drip, rhinorrhea and sinus pressure. Negative for congestion, ear discharge, facial swelling, sinus pain, sneezing, sore  throat and voice change.   Respiratory:  Negative for cough, chest tightness, shortness of breath and wheezing.   Cardiovascular:  Negative for chest pain, palpitations and leg swelling.  Gastrointestinal: Negative.   Neurological:  Positive for headaches. Negative for dizziness and numbness.  Psychiatric/Behavioral: Negative.      Per HPI unless specifically indicated above     Objective:     BP (!) 140/72 (BP Location: Left Arm, Patient Position: Sitting, Cuff Size: Normal)   Pulse 85   Temp 98 F (36.7 C) (Oral)   Ht 5' 4.5" (1.638 m)   Wt 196 lb (88.9 kg)   SpO2 97%   BMI 33.12 kg/m   Wt Readings from Last 3 Encounters:  04/16/24 196 lb (88.9 kg)  04/05/24 196 lb 3.2 oz (89 kg)  03/12/24 196 lb (88.9 kg)    Physical Exam Vitals and nursing note reviewed.  Constitutional:      General: She is awake. She is not in acute distress.    Appearance: She is well-developed and well-groomed. She is obese. She is ill-appearing. She is not toxic-appearing.  HENT:     Head: Normocephalic.     Right Ear: Hearing, ear canal and external ear normal. A middle ear effusion is present. Tympanic membrane is not injected or perforated.     Left Ear: Hearing, ear canal and external ear normal. A middle ear effusion is present. Tympanic membrane is not injected or perforated.     Nose: Rhinorrhea present. Rhinorrhea is clear.  Right Sinus: No maxillary sinus tenderness or frontal sinus tenderness.     Left Sinus: No maxillary sinus tenderness or frontal sinus tenderness.     Mouth/Throat:     Mouth: Mucous membranes are moist.     Pharynx: Posterior oropharyngeal erythema (mild) present. No pharyngeal swelling or oropharyngeal exudate.  Eyes:     General: Lids are normal.        Right eye: No discharge.        Left eye: No discharge.     Conjunctiva/sclera: Conjunctivae normal.     Pupils: Pupils are equal, round, and reactive to light.  Neck:     Thyroid : No thyromegaly.      Vascular: No carotid bruit.  Cardiovascular:     Rate and Rhythm: Normal rate and regular rhythm.     Heart sounds: Normal heart sounds. No murmur heard.    No gallop.  Pulmonary:     Effort: Pulmonary effort is normal. No accessory muscle usage or respiratory distress.     Breath sounds: Normal breath sounds. No decreased breath sounds, wheezing or rales.     Comments: Clear throughout today. Abdominal:     General: Bowel sounds are normal.     Palpations: Abdomen is soft. There is no hepatomegaly or splenomegaly.  Musculoskeletal:     Cervical back: Normal range of motion and neck supple.     Right lower leg: No edema.     Left lower leg: No edema.  Lymphadenopathy:     Head:     Right side of head: No submental, submandibular, tonsillar, preauricular or posterior auricular adenopathy.     Left side of head: No submental, submandibular, tonsillar, preauricular or posterior auricular adenopathy.     Cervical: No cervical adenopathy.  Skin:    General: Skin is warm and dry.  Neurological:     Mental Status: She is alert and oriented to person, place, and time.  Psychiatric:        Attention and Perception: Attention normal.        Mood and Affect: Mood normal.        Speech: Speech normal.        Behavior: Behavior normal. Behavior is cooperative.        Thought Content: Thought content normal.     Results for orders placed or performed in visit on 03/12/24  Vitamin B12   Collection Time: 03/12/24 12:40 PM  Result Value Ref Range   Vitamin B-12 252 180 - 914 pg/mL  Ferritin   Collection Time: 03/12/24 12:42 PM  Result Value Ref Range   Ferritin 34 11 - 307 ng/mL  Iron  and TIBC   Collection Time: 03/12/24 12:42 PM  Result Value Ref Range   Iron  35 28 - 170 ug/dL   TIBC 161 (H) 096 - 045 ug/dL   Saturation Ratios 7 (L) 10.4 - 31.8 %   UIBC 448 ug/dL  CBC with Differential (Cancer Center Only)   Collection Time: 03/12/24 12:42 PM  Result Value Ref Range   WBC Count  9.1 4.0 - 10.5 K/uL   RBC 4.60 3.87 - 5.11 MIL/uL   Hemoglobin 12.5 12.0 - 15.0 g/dL   HCT 40.9 81.1 - 91.4 %   MCV 85.9 80.0 - 100.0 fL   MCH 27.2 26.0 - 34.0 pg   MCHC 31.6 30.0 - 36.0 g/dL   RDW 78.2 (H) 95.6 - 21.3 %   Platelet Count 374 150 - 400 K/uL   nRBC 0.0  0.0 - 0.2 %   Neutrophils Relative % 78 %   Neutro Abs 7.1 1.7 - 7.7 K/uL   Lymphocytes Relative 10 %   Lymphs Abs 0.9 0.7 - 4.0 K/uL   Monocytes Relative 8 %   Monocytes Absolute 0.7 0.1 - 1.0 K/uL   Eosinophils Relative 2 %   Eosinophils Absolute 0.2 0.0 - 0.5 K/uL   Basophils Relative 1 %   Basophils Absolute 0.1 0.0 - 0.1 K/uL   Immature Granulocytes 1 %   Abs Immature Granulocytes 0.06 0.00 - 0.07 K/uL      Assessment & Plan:   Problem List Items Addressed This Visit       Respiratory   Pneumonia - Primary   Acute and improving.  Continues to have some sinus symptoms, recommend to avoid sinus medications or medications that state D on them as suspect this elevated BP today.  Recommend taking OTC Claritin or Zyrtec daily for the next 7-10 days to assist with remaining sinus issues.  Overall exam improved today.        Follow up plan: Return in about 4 months (around 08/16/2024) for please make sure scheduled for follow-up in October.

## 2024-04-19 ENCOUNTER — Inpatient Hospital Stay: Payer: Medicare (Managed Care)

## 2024-04-26 ENCOUNTER — Inpatient Hospital Stay: Payer: Medicare (Managed Care)

## 2024-05-14 ENCOUNTER — Inpatient Hospital Stay: Payer: Medicare (Managed Care) | Attending: Internal Medicine

## 2024-05-14 DIAGNOSIS — E538 Deficiency of other specified B group vitamins: Secondary | ICD-10-CM | POA: Diagnosis present

## 2024-05-14 DIAGNOSIS — D509 Iron deficiency anemia, unspecified: Secondary | ICD-10-CM

## 2024-05-14 MED ORDER — CYANOCOBALAMIN 1000 MCG/ML IJ SOLN
1000.0000 ug | Freq: Once | INTRAMUSCULAR | Status: AC
  Administered 2024-05-14: 1000 ug via INTRAMUSCULAR
  Filled 2024-05-14: qty 1

## 2024-05-15 ENCOUNTER — Other Ambulatory Visit: Payer: Self-pay | Admitting: Nurse Practitioner

## 2024-05-17 NOTE — Telephone Encounter (Signed)
 Too soon for refill, refilled on 02/02/24 for 90 and 1 RF.  Requested Prescriptions  Pending Prescriptions Disp Refills   rosuvastatin  (CRESTOR ) 20 MG tablet [Pharmacy Med Name: ROSUVASTATIN  20MG  TABLETS] 90 tablet 1    Sig: TAKE 1 TABLET(20 MG) BY MOUTH DAILY     Cardiovascular:  Antilipid - Statins 2 Failed - 05/17/2024  9:49 AM      Failed - Lipid Panel in normal range within the last 12 months    Cholesterol, Total  Date Value Ref Range Status  01/31/2024 167 100 - 199 mg/dL Final   LDL Chol Calc (NIH)  Date Value Ref Range Status  01/31/2024 86 0 - 99 mg/dL Final   HDL  Date Value Ref Range Status  01/31/2024 62 >39 mg/dL Final   Triglycerides  Date Value Ref Range Status  01/31/2024 107 0 - 149 mg/dL Final         Passed - Cr in normal range and within 360 days    Creatinine, Ser  Date Value Ref Range Status  01/31/2024 0.70 0.57 - 1.00 mg/dL Final         Passed - Patient is not pregnant      Passed - Valid encounter within last 12 months    Recent Outpatient Visits           1 month ago Pneumonia of left lower lobe due to infectious organism   [place] [facility] [place], [place] T, NP   1 month ago Pneumonia of left lower lobe due to infectious organism   [place] [facility] [place], Hendrix T, NP   3 months ago Paroxysmal atrial fibrillation [facility])   [place] [facility] [place], Melanie DASEN, NP

## 2024-06-14 ENCOUNTER — Inpatient Hospital Stay: Payer: Medicare (Managed Care)

## 2024-06-18 ENCOUNTER — Inpatient Hospital Stay: Payer: Medicare (Managed Care) | Attending: Internal Medicine

## 2024-06-18 DIAGNOSIS — D509 Iron deficiency anemia, unspecified: Secondary | ICD-10-CM

## 2024-06-18 DIAGNOSIS — E538 Deficiency of other specified B group vitamins: Secondary | ICD-10-CM | POA: Insufficient documentation

## 2024-06-18 MED ORDER — CYANOCOBALAMIN 1000 MCG/ML IJ SOLN
1000.0000 ug | Freq: Once | INTRAMUSCULAR | Status: AC
  Administered 2024-06-18 (×2): 1000 ug via INTRAMUSCULAR
  Filled 2024-06-18: qty 1

## 2024-07-07 NOTE — Patient Instructions (Incomplete)
 Edema  Edema is when you have too much fluid in your body or under your skin. Edema may make your legs, feet, and ankles swell. Swelling often happens in looser tissues, such as around your eyes. This is a common condition. It gets more common as you get older. There are many possible causes of edema. These include: Eating too much salt (sodium). Being on your feet or sitting for a long time. Certain medical conditions, such as: Pregnancy. Heart failure. Liver disease. Kidney disease. Cancer. Hot weather may make edema worse. Edema is usually painless. Your skin may look swollen or shiny. Follow these instructions at home: Medicines Take over-the-counter and prescription medicines only as told by your doctor. Your doctor may prescribe a medicine to help your body get rid of extra water (diuretic). Take this medicine if you are told to take it. Eating and drinking Eat a low-salt (low-sodium) diet as told by your doctor. Sometimes, eating less salt may reduce swelling. Depending on the cause of your swelling, you may need to limit how much fluid you drink (fluid restriction). General instructions Raise the injured area above the level of your heart while you are sitting or lying down. Do not sit still or stand for a long time. Do not wear tight clothes. Do not wear garters on your upper legs. Exercise your legs. This can help the swelling go down. Wear compression stockings as told by your doctor. It is important that these are the right size. These should be prescribed by your doctor to prevent possible injuries. If elastic bandages or wraps are recommended, use them as told by your doctor. Contact a doctor if: Treatment is not working. You have heart, liver, or kidney disease and have symptoms of edema. You have sudden and unexplained weight gain. Get help right away if: You have shortness of breath or chest pain. You cannot breathe when you lie down. You have pain, redness, or  warmth in the swollen areas. You have heart, liver, or kidney disease and get edema all of a sudden. You have a fever and your symptoms get worse all of a sudden. These symptoms may be an emergency. Get help right away. Call 911. Do not wait to see if the symptoms will go away. Do not drive yourself to the hospital. Summary Edema is when you have too much fluid in your body or under your skin. Edema may make your legs, feet, and ankles swell. Swelling often happens in looser tissues, such as around your eyes. Raise the injured area above the level of your heart while you are sitting or lying down. Follow your doctor's instructions about diet and how much fluid you can drink. This information is not intended to replace advice given to you by your health care provider. Make sure you discuss any questions you have with your health care provider. Document Revised: 06/28/2021 Document Reviewed: 06/28/2021 Elsevier Patient Education  2024 [employer].

## 2024-07-12 ENCOUNTER — Ambulatory Visit: Admitting: Nurse Practitioner

## 2024-07-16 ENCOUNTER — Inpatient Hospital Stay: Payer: Medicare (Managed Care) | Attending: Internal Medicine

## 2024-07-16 ENCOUNTER — Inpatient Hospital Stay: Payer: Medicare (Managed Care)

## 2024-07-16 ENCOUNTER — Inpatient Hospital Stay (HOSPITAL_BASED_OUTPATIENT_CLINIC_OR_DEPARTMENT_OTHER): Payer: Medicare (Managed Care) | Admitting: Internal Medicine

## 2024-07-16 VITALS — BP 154/68 | HR 87 | Resp 18

## 2024-07-16 DIAGNOSIS — Z7901 Long term (current) use of anticoagulants: Secondary | ICD-10-CM | POA: Insufficient documentation

## 2024-07-16 DIAGNOSIS — D509 Iron deficiency anemia, unspecified: Secondary | ICD-10-CM

## 2024-07-16 DIAGNOSIS — E538 Deficiency of other specified B group vitamins: Secondary | ICD-10-CM | POA: Insufficient documentation

## 2024-07-16 DIAGNOSIS — I4891 Unspecified atrial fibrillation: Secondary | ICD-10-CM | POA: Insufficient documentation

## 2024-07-16 LAB — CBC WITH DIFFERENTIAL (CANCER CENTER ONLY)
Abs Immature Granulocytes: 0.04 K/uL (ref 0.00–0.07)
Basophils Absolute: 0.1 K/uL (ref 0.0–0.1)
Basophils Relative: 1 %
Eosinophils Absolute: 0.2 K/uL (ref 0.0–0.5)
Eosinophils Relative: 3 %
HCT: 34.8 % — ABNORMAL LOW (ref 36.0–46.0)
Hemoglobin: 10.2 g/dL — ABNORMAL LOW (ref 12.0–15.0)
Immature Granulocytes: 0 %
Lymphocytes Relative: 13 %
Lymphs Abs: 1.2 K/uL (ref 0.7–4.0)
MCH: 24.1 pg — ABNORMAL LOW (ref 26.0–34.0)
MCHC: 29.3 g/dL — ABNORMAL LOW (ref 30.0–36.0)
MCV: 82.1 fL (ref 80.0–100.0)
Monocytes Absolute: 0.8 K/uL (ref 0.1–1.0)
Monocytes Relative: 9 %
Neutro Abs: 6.7 K/uL (ref 1.7–7.7)
Neutrophils Relative %: 74 %
Platelet Count: 394 K/uL (ref 150–400)
RBC: 4.24 MIL/uL (ref 3.87–5.11)
RDW: 15.1 % (ref 11.5–15.5)
WBC Count: 9 K/uL (ref 4.0–10.5)
nRBC: 0 % (ref 0.0–0.2)

## 2024-07-16 LAB — IRON AND TIBC
Iron: 20 ug/dL — ABNORMAL LOW (ref 28–170)
Saturation Ratios: 4 % — ABNORMAL LOW (ref 10.4–31.8)
TIBC: 564 ug/dL — ABNORMAL HIGH (ref 250–450)
UIBC: 544 ug/dL

## 2024-07-16 LAB — VITAMIN B12: Vitamin B-12: 508 pg/mL (ref 180–914)

## 2024-07-16 LAB — FERRITIN: Ferritin: 14 ng/mL (ref 11–307)

## 2024-07-16 MED ORDER — IRON SUCROSE 20 MG/ML IV SOLN
200.0000 mg | Freq: Once | INTRAVENOUS | Status: AC
  Administered 2024-07-16: 200 mg via INTRAVENOUS
  Filled 2024-07-16: qty 10

## 2024-07-16 MED ORDER — CYANOCOBALAMIN 1000 MCG/ML IJ SOLN
1000.0000 ug | Freq: Once | INTRAMUSCULAR | Status: AC
  Administered 2024-07-16: 1000 ug via INTRAMUSCULAR
  Filled 2024-07-16: qty 1

## 2024-07-16 NOTE — Progress Notes (Signed)
 [facility] CONSULT NOTE  Patient Care Team: Valerio Melanie DASEN, NP as PCP - General (Nurse Practitioner) Rennie Cindy SAUNDERS, MD as Consulting Physician (Hematology and Oncology) Mevelyn Romero KATHEE DEVONNA (Gastroenterology) Ammon Blunt, MD as Consulting Physician (Cardiology) Theotis Lavelle BRAVO, MD as Referring Physician (Pulmonary Disease) Myrna Adine Anes, MD as Consulting Physician (Ophthalmology)  CHIEF COMPLAINTS/PURPOSE OF CONSULTATION: Anemia iron  deficiency/B12 deficiency  #Anemia-iron /B12 deficiency [LA, NP-July 2022]-s/p EGD colonoscopy [OCT 2022; Dr.Toledo]; hx of capsule [in last 5 years] [JAN 2021]  # A.fib on eliquis.   HISTORY OF PRESENTING ILLNESS: Alone; ambulating independently.   Mary Irwin 73 y.o.  female with a history of B12/iron  deficiency of unclear etiology is here for follow-up.  Patient states for the past few weeks she has been swollen in the abdomen area and the feet.  Chronic dyspnea with exertion.  Otherwise denies any blood in stools or black-colored stools.    Patient having chronic  back and hip pain-overall stable.  Review of Systems  Constitutional:  Positive for malaise/fatigue. Negative for chills, diaphoresis, fever and weight loss.  HENT:  Negative for nosebleeds and sore throat.   Eyes:  Negative for double vision.  Respiratory:  Negative for cough, hemoptysis, sputum production, shortness of breath and wheezing.   Cardiovascular:  Negative for chest pain, palpitations, orthopnea and leg swelling.  Gastrointestinal:  Negative for abdominal pain, blood in stool, constipation, diarrhea, heartburn, melena, nausea and vomiting.  Genitourinary:  Negative for dysuria, frequency and urgency.  Musculoskeletal:  Positive for back pain and joint pain.  Skin: Negative.  Negative for itching and rash.  Neurological:  Negative for dizziness, tingling, focal weakness, weakness and headaches.  Endo/Heme/Allergies:  Does not  bruise/bleed easily.  Psychiatric/Behavioral:  Negative for depression. The patient is not nervous/anxious and does not have insomnia.      MEDICAL HISTORY:  Past Medical History:  Diagnosis Date   Anemia    on iron  supplements   Arthritis    all over   Asthma    once a week   Atrial fibrillation (HCC)    Blood transfusion without reported diagnosis    Chronic seasonal allergic rhinitis due to pollen    Clotting disorder (HCC)    Diverticulosis    Dysrhythmia    Atrial Fibrillation   Fibromyalgia    GERD (gastroesophageal reflux disease)    Heart murmur    HLD (hyperlipidemia)    Hypertension    Hypothyroidism    no meds   IBS (irritable bowel syndrome)    PONV (postoperative nausea and vomiting)    Psychophysiological insomnia    PVC's (premature ventricular contractions)    Sinus trouble     SURGICAL HISTORY: Past Surgical History:  Procedure Laterality Date   BACK SURGERY     CATARACT EXTRACTION W/PHACO Right 04/22/2019   Procedure: CATARACT EXTRACTION PHACO AND INTRAOCULAR LENS PLACEMENT (IOC) RIGHT;  Surgeon: Myrna Adine Anes, MD;  Location[facility];  Service: Ophthalmology;  Laterality: Right;   CATARACT EXTRACTION W/PHACO Left 05/27/2019   Procedure: CATARACT EXTRACTION PHACO AND INTRAOCULAR LENS PLACEMENT (IOC)  LEFT;  Surgeon: Myrna Adine Anes, MD;  Location: [facility];  Service: Ophthalmology;  Laterality: Left;   CHOLECYSTECTOMY N/A 09/22/2017   Procedure: LAPAROSCOPIC CHOLECYSTECTOMY WITH INTRAOPERATIVE CHOLANGIOGRAM;  Surgeon: Desiderio Schanz, MD;  Location: [facility];  Service: General;  Laterality: N/A;   COLONOSCOPY WITH PROPOFOL  N/A 05/23/2017   Procedure: COLONOSCOPY WITH PROPOFOL ;  Surgeon: Gaylyn Gladis PENNER, MD;  Location:  [facility];  Service: Endoscopy;  Laterality: N/A;   COLONOSCOPY WITH PROPOFOL  N/A 08/18/2021   Procedure: COLONOSCOPY WITH PROPOFOL ;  Surgeon: Toledo, Ladell POUR, MD;  Location: [facility];  Service:  Gastroenterology;  Laterality: N/A;   ESOPHAGOGASTRODUODENOSCOPY (EGD) WITH PROPOFOL  N/A 05/23/2017   Procedure: ESOPHAGOGASTRODUODENOSCOPY (EGD) WITH PROPOFOL ;  Surgeon: Gaylyn Gladis PENNER, MD;  Location: [facility];  Service: Endoscopy;  Laterality: N/A;   ESOPHAGOGASTRODUODENOSCOPY (EGD) WITH PROPOFOL  N/A 07/31/2017   Procedure: ESOPHAGOGASTRODUODENOSCOPY (EGD) WITH PROPOFOL ;  Surgeon: Gaylyn Gladis PENNER, MD;  Location: [facility];  Service: Endoscopy;  Laterality: N/A;   ESOPHAGOGASTRODUODENOSCOPY (EGD) WITH PROPOFOL  N/A 08/18/2021   Procedure: ESOPHAGOGASTRODUODENOSCOPY (EGD) WITH PROPOFOL ;  Surgeon: Toledo, Ladell POUR, MD;  Location: [facility];  Service: Gastroenterology;  Laterality: N/A;   EYE SURGERY     GIVENS CAPSULE STUDY     HERNIA REPAIR     umbilical   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   JOINT REPLACEMENT Bilateral    total hip replacement   REPLACEMENT TOTAL KNEE Right 02/09/2023   TUBAL LIGATION      SOCIAL HISTORY: Social History   Socioeconomic History   Marital status: Divorced    Spouse name: Not on file   Number of children: 1   Years of education: Not on file   Highest education level: 12th grade  Occupational History   Occupation: retired  Tobacco Use   Smoking status: Never   Smokeless tobacco: Never  Vaping Use   Vaping status: Never Used  Substance and Sexual Activity   Alcohol use: No   Drug use: No   Sexual activity: Not Currently    Birth control/protection: I.U.D.  Other Topics Concern   Not on file  Social History Narrative   Not on file   Social Drivers of Health   Financial Resource Strain: Medium Risk (01/27/2024)   Overall Financial Resource Strain (CARDIA)    Difficulty of Paying Living Expenses: Somewhat hard  Food Insecurity: No Food Insecurity (01/27/2024)   Hunger Vital Sign    Worried About Running Out of Food in the Last Year: Never true    Ran Out of Food in the Last Year: Never true   Transportation Needs: No Transportation Needs (01/27/2024)   PRAPARE - Administrator, Civil Service (Medical): No    Lack of Transportation (Non-Medical): No  Physical Activity: Inactive (01/27/2024)   Exercise Vital Sign    Days of Exercise per Week: 0 days    Minutes of Exercise per Session: 0 min  Stress: No Stress Concern Present (01/27/2024)   [employer] of Occupational Health - Occupational Stress Questionnaire    Feeling of Stress : Not at all  Social Connections: Moderately Isolated (01/27/2024)   Social Connection and Isolation Panel    Frequency of Communication with Friends and Family: More than three times a week    Frequency of Social Gatherings with Friends and Family: Three times a week    Attends Religious Services: 1 to 4 times per year    Active Member of Clubs or Organizations: No    Attends Banker Meetings: Never    Marital Status: Divorced  Catering manager Violence: Not At Risk (10/03/2023)   Humiliation, Afraid, Rape, and Kick questionnaire    Fear of Current or Ex-Partner: No    Emotionally Abused: No    Physically Abused: No    Sexually Abused: No    FAMILY HISTORY: Family History  Problem Relation Age  of Onset   Hypertension Mother    Dementia Mother    Arthritis Mother    Other Father 4       Sepsis   Arthritis Father    Breast cancer Maternal Aunt 38   Arthritis Sister    Asthma Sister     ALLERGIES:  is allergic to gabapentin , lisinopril , and metoclopramide.  MEDICATIONS:  Current Outpatient Medications  Medication Sig Dispense Refill   albuterol  (VENTOLIN  HFA) 108 (90 Base) MCG/ACT inhaler Inhale 1-2 puffs into the lungs every 6 (six) hours as needed for wheezing or shortness of breath. 8 g 3   apixaban (ELIQUIS) 5 MG TABS tablet Take 5 mg by mouth 2 (two) times daily. Am and bedtime     diltiazem  (CARDIZEM  CD) 300 MG 24 hr capsule Take 300 mg by mouth daily.     DULoxetine  (CYMBALTA ) 60 MG capsule Take 1  capsule (60 mg total) by mouth daily. 90 capsule 4   fluticasone  furoate-vilanterol (BREO ELLIPTA ) 200-25 MCG/ACT AEPB Inhale 1 puff into the lungs daily. 90 each 4   hydrochlorothiazide (HYDRODIURIL) 25 MG tablet Take 25 mg by mouth daily. am     Hyoscyamine Sulfate SL 0.125 MG SUBL Place 0.125 mg under the tongue every 6 (six) hours as needed. Place 1 tablet (0.125 mg total) under the tongue every 6 (six) hours as needed for Cramping     LINZESS  72 MCG capsule Take 1 capsule (72 mcg total) by mouth every morning. 30 capsule 12   rosuvastatin  (CRESTOR ) 20 MG tablet Take 1 tablet (20 mg total) by mouth daily. 90 tablet 1   No current facility-administered medications for this visit.      SABRA  PHYSICAL EXAMINATION:  Vitals:   07/16/24 1416 07/16/24 1447  BP: (!) 142/96 139/61  Pulse: 81 62  Resp: 19   Temp: 98.6 F (37 C)   SpO2: 98%     Filed Weights   07/16/24 1416  Weight: 200 lb 3.2 oz (90.8 kg)     Physical Exam Vitals and nursing note reviewed.  HENT:     Head: Normocephalic and atraumatic.     Mouth/Throat:     Pharynx: Oropharynx is clear.  Eyes:     Extraocular Movements: Extraocular movements intact.     Pupils: Pupils are equal, round, and reactive to light.  Cardiovascular:     Rate and Rhythm: Normal rate. Rhythm irregular.  Pulmonary:     Comments: Decreased breath sounds bilaterally.  Abdominal:     Palpations: Abdomen is soft.  Musculoskeletal:        General: Normal range of motion.     Cervical back: Normal range of motion.  Skin:    General: Skin is warm.  Neurological:     General: No focal deficit present.     Mental Status: She is alert and oriented to person, place, and time.  Psychiatric:        Behavior: Behavior normal.        Judgment: Judgment normal.      LABORATORY DATA:  I have reviewed the data as listed Lab Results  Component Value Date   WBC 9.0 07/16/2024   HGB 10.2 (L) 07/16/2024   HCT 34.8 (L) 07/16/2024   MCV 82.1  07/16/2024   PLT 394 07/16/2024   Recent Labs    07/25/23 1436 01/31/24 1431  NA 143 141  K 4.1 4.4  CL 106 103  CO2 22 24  GLUCOSE 84 93  BUN  15 14  CREATININE 0.67 0.70  CALCIUM  10.4* 10.3  PROT 7.1 6.9  ALBUMIN 4.8 4.6  AST 20 22  ALT 19 21  ALKPHOS 107 123*  BILITOT 0.2 <0.2     RADIOGRAPHIC STUDIES: I have personally reviewed the radiological images as listed and agreed with the findings in the report. No results found.  B12 deficiency # Iron  deficient anemia-unclear etiology.  S/p IV Venofer - FEB 2025- IDA s/p venofer .   # proceed with venofer - hb 10.5-   #Etiology of iron  deficient is unclear s/p EGD/colonoscopy [[facility];Ms.Woodard; stool occult+] October 2022; if worsening consider capsule study [previously in last 5 years]/CT imaging- FEB 2021-NEG.   # A.fib/heart murmur- on eliquis [Dr,Parashoes]- stable.    # B12 deficiency- b12 170 [2023. jan]. continue monthly b12 1000 mcg every 4 weeks.   # A.fib- on eliquis [Dr.Parashoes]   DISPOSITION: # Venofer  today; B12 injection today  # monthly-venofer -  B12 injections x 3   # in 6 months- MD;  labs-  [cbc, ferritin, iron  studies; b12]   possible venofer /b12 injection-Dr.B    All questions were answered. The patient knows to call the clinic with any problems, questions or concerns.    Cindy JONELLE Joe, MD 07/16/2024 3:06 PM

## 2024-07-16 NOTE — Progress Notes (Signed)
 Patient states for the past few weeks she has been swollen in the abdomen area and the feet. Patient doesn't know why she has been swelling.

## 2024-07-16 NOTE — Assessment & Plan Note (Addendum)
#   Iron  deficient anemia-unclear etiology.  S/p IV Venofer - FEB 2025- IDA s/p venofer .   # proceed with venofer - hb 10.5-   #Etiology of iron  deficient is unclear s/p EGD/colonoscopy [[facility];Ms.Woodard; stool occult+] October 2022; if worsening consider capsule study [previously in last 5 years]/CT imaging- FEB 2021-NEG.   # A.fib/heart murmur- on eliquis [Dr,Parashoes]- stable.    # B12 deficiency- b12 170 [2023. jan]. continue monthly b12 1000 mcg every 4 weeks.   # A.fib- on eliquis [Dr.Parashoes]   DISPOSITION: # Venofer  today; B12 injection today  # monthly-venofer -  B12 injections x 3   # in 6 months- MD;  labs-  [cbc, ferritin, iron  studies; b12]   possible venofer /b12 injection-Dr.B

## 2024-08-04 NOTE — Patient Instructions (Signed)

## 2024-08-06 ENCOUNTER — Ambulatory Visit (INDEPENDENT_AMBULATORY_CARE_PROVIDER_SITE_OTHER): Payer: Medicare (Managed Care) | Admitting: Nurse Practitioner

## 2024-08-06 ENCOUNTER — Encounter: Payer: Self-pay | Admitting: Nurse Practitioner

## 2024-08-06 VITALS — BP 128/74 | HR 94 | Temp 98.4°F | Resp 15 | Ht 64.49 in | Wt 202.0 lb

## 2024-08-06 DIAGNOSIS — D508 Other iron deficiency anemias: Secondary | ICD-10-CM

## 2024-08-06 DIAGNOSIS — J4520 Mild intermittent asthma, uncomplicated: Secondary | ICD-10-CM

## 2024-08-06 DIAGNOSIS — I700 Atherosclerosis of aorta: Secondary | ICD-10-CM | POA: Diagnosis not present

## 2024-08-06 DIAGNOSIS — E7800 Pure hypercholesterolemia, unspecified: Secondary | ICD-10-CM

## 2024-08-06 DIAGNOSIS — M797 Fibromyalgia: Secondary | ICD-10-CM

## 2024-08-06 DIAGNOSIS — I10 Essential (primary) hypertension: Secondary | ICD-10-CM

## 2024-08-06 DIAGNOSIS — I480 Paroxysmal atrial fibrillation: Secondary | ICD-10-CM | POA: Diagnosis not present

## 2024-08-06 DIAGNOSIS — E079 Disorder of thyroid, unspecified: Secondary | ICD-10-CM

## 2024-08-06 DIAGNOSIS — G8929 Other chronic pain: Secondary | ICD-10-CM

## 2024-08-06 DIAGNOSIS — E559 Vitamin D deficiency, unspecified: Secondary | ICD-10-CM

## 2024-08-06 DIAGNOSIS — M5441 Lumbago with sciatica, right side: Secondary | ICD-10-CM

## 2024-08-06 DIAGNOSIS — D6869 Other thrombophilia: Secondary | ICD-10-CM

## 2024-08-06 MED ORDER — ALPRAZOLAM 0.5 MG PO TABS: ORAL_TABLET | ORAL | 0 refills | Status: AC

## 2024-08-06 MED ORDER — DULOXETINE HCL 60 MG PO CPEP: 60.0000 mg | ORAL_CAPSULE | Freq: Every day | ORAL | 4 refills | Status: AC

## 2024-08-06 MED ORDER — FLUTICASONE FUROATE-VILANTEROL 200-25 MCG/ACT IN AEPB: 1.0000 | INHALATION_SPRAY | Freq: Every day | RESPIRATORY_TRACT | 4 refills | Status: AC

## 2024-08-06 MED ORDER — LINZESS 72 MCG PO CAPS: 72.0000 ug | ORAL_CAPSULE | Freq: Every morning | ORAL | 12 refills | Status: AC

## 2024-08-06 NOTE — Assessment & Plan Note (Signed)
 Chronic, ongoing, rate controlled.  Followed by cardiology.  Continue this collaboration and current medication regimen as prescribed by them, including Eliquis. Recent notes reviewed. Will repeat echo since is having more edema to ankles/feet + had recent episode of abdominal distension, now improved.  Last echo was 2022.

## 2024-08-06 NOTE — Assessment & Plan Note (Signed)
Secondary to Eliquis and A-Fib.  Continue current medication regimen and adjust as needed.

## 2024-08-06 NOTE — Assessment & Plan Note (Signed)
Chronic, ongoing.   Continue Rosuvastatin and adjust dosing as needed. Recheck lipid panel and CMP today.

## 2024-08-06 NOTE — Assessment & Plan Note (Signed)
 Chronic, scheduled for MRI tomorrow with [facility].  Will send in Xanax to take prior to procedure due to her claustrophobia.

## 2024-08-06 NOTE — Assessment & Plan Note (Signed)
 Chronic, ongoing.  BP at goal today in office.  Recommend she monitor BP at least a few mornings a week at home and document.  DASH diet at home.  Continue current medication regimen and adjust as needed + collaboration with cardiology.  Labs today: Lipid, CMP.  Will repeat echo since is having more edema to ankles/feet + had recent episode of abdominal distension, now improved.  Last echo was 2022.

## 2024-08-06 NOTE — Assessment & Plan Note (Signed)
 Chronic, ongoing.  No benefit from Amitriptyline in past and Gabapentin  caused a rash.  Duloxetine  is offering her benefit, less pain, continue 60 MG.  Educated her on this and side effects.  Continue to collaborate with physiatry and ortho as needed.  Discussed with patient that she may benefit referral to pain clinic in future if ongoing discomfort and poor control.

## 2024-08-06 NOTE — Assessment & Plan Note (Signed)
Chronic, ongoing, followed by pulmonary with last visit in 2023.  Continue current inhaler regimen and adjust as needed.  Currently no exacerbation.

## 2024-08-06 NOTE — Assessment & Plan Note (Signed)
Chronic, ongoing.  Check level today and recommend she take supplement as ordered.

## 2024-08-06 NOTE — Progress Notes (Signed)
 BP 128/74 (BP Location: Left Arm, Patient Position: Sitting, Cuff Size: Normal)   Pulse 94   Temp 98.4 F (36.9 C) (Oral)   Resp 15   Ht 5' 4.49 (1.638 m)   Wt 202 lb (91.6 kg)   SpO2 97%   BMI 34.15 kg/m    Subjective:    Patient ID: Devere LITTIE Carne, female    DOB: 30-Jun-1951, 73 y.o.   MRN: [id number]  HPI: SEINI LANNOM is a 73 y.o. female  Chief Complaint  Patient presents with   Pain    Back pain and left>right hip pain. MRI is scheduled for tomorrow.    HTN/HLD    No recent home checks.    GI Problem    Over 2 weeks went from 196 to 202 stomach was rock hard. Happened a couple weeks ago and cancelled last appointment. Now unable to get weight back down.    Foot Swelling    From the morning till when she goes. Raising her feet does help. Also mentions her feet hurt and across the ankle joint is very sore and turns very light colored.    Bleeding/Bruising    Mentions that she gets red spots on her arms anytime she hits them.    HYPERTENSION / HYPERLIPIDEMIA + A-FIB Continues Eliquis, Cardizem , HCTZ and Rosuvastatin . Aortic atherosclerosis noted on imaging 01/03/2020. Saw cardiology last on 08/15/23. Last echo was 04/21/21 - noted EF 55%, mild LVH, Mild AR.  Satisfied with current treatment? yes Duration of hypertension: chronic BP monitoring frequency: rarely BP range:  BP medication side effects: no Past BP meds: unsure Aspirin : no Recent stressors: no Recurrent headaches: no Visual changes: no Palpitations: no Dyspnea: no and no orthopnea Chest pain: no Lower extremity edema: every day for the past month, goes down a little bit with elevating feet but never goes completely away Dizzy/lightheaded: no The 10-year ASCVD risk score (Arnett DK, et al., 2019) is: 16.6%   Values used to calculate the score:     Age: 72 years     Clincally relevant sex: Female     Is Non-Hispanic African American: No     Diabetic: No     Tobacco smoker: No     Systolic Blood  Pressure: 128 mmHg     Is BP treated: Yes     HDL Cholesterol: 62 mg/dL     Total Cholesterol: 167 mg/dL    ANEMIA Saw hematology last 07/16/24, gets infusions -- had one last week. Anemia status: controlled Etiology of anemia: iron  deficiency Severity of anemia: moderate Fatigue: occasional, if misses infusion Decreased exercise tolerance: no Dyspnea on exertion: no Palpitations: no Bleeding: no Pica: no    GERD AND CONSTIPATION Continues Linzess , no heart burn medication. Saw GI last 12/22/21.  A couple weeks ago her abdomen was distended, but this has improved however weight is not trending back down. Linzess  helps constipation, but occasionally has to stop to get back on track. GERD control status: controlled Satisfied with current treatment? yes Heartburn frequency: none Medication side effects: no  Medication compliance: stable Previous GERD medications: none Antacid use frequency:  none lately Duration: none Nature: none Dysphagia: no Odynophagia:  no Hematemesis: no Blood in stool: no EGD: 08/18/21   ASTHMA Continues Breo and Albuterol . Has not seen pulmonary since 01/25/22. Asthma status: stable Satisfied with current treatment?: yes Albuterol /rescue inhaler frequency: occasional Dyspnea frequency: no Wheezing frequency: no Cough frequency: no Nocturnal symptom frequency: no Limitation of activity: no Current upper  respiratory symptoms: was sick recently Triggers: unknown Home peak flows: none Last Spirometry: with pulmonary Failed/intolerant to following asthma meds: none Asthma meds in past: Breo Aerochamber/spacer use: no Visits to ER or Urgent Care in past year: no Pneumovax: Up to Date Influenza: Up to Date   FIBROMYALGIA Chronic pain with with Fibromyalgia. Continues on Duloxetine . Was given Tramadol by [facility] recently for back pain. Goes for MRI tomorrow lower back. Is claustrophobic and is concerned about MRI. Continues Vitamin D   supplement.  Had Prednisone  taper a couple weeks ago, but her swelling and stomach issues were before this.   Pain status: stable Satisfied with current treatment?: yes Medication side effects: no Medication compliance: good compliance Duration: chronic Location: currently left hip Quality: dull and aching Current pain level: 7/10 Previous pain level: not as bad as is now Aggravating factors: lifting, movement, walking, and bending Alleviating factors: nothing Previous pain specialty evaluation: no Non-narcotic analgesic meds: no Narcotic contract:no Treatments attempted: rest, heat, APAP, and ibuprofen  , Prednisone , and Tramadol    07/16/2024    3:21 PM 04/16/2024   10:52 AM 04/05/2024   10:01 AM 01/31/2024    1:49 PM 10/03/2023    1:35 PM  Depression screen PHQ 2/9  Decreased Interest 0 0 0 0 0  Down, Depressed, Hopeless 0 0 0 0 0  PHQ - 2 Score 0 0 0 0 0  Altered sleeping  0 0 0 0  Tired, decreased energy  1 1 1  0  Change in appetite  0 0 0 0  Feeling bad or failure about yourself   0 0 0 0  Trouble concentrating  0 0 0 0  Moving slowly or fidgety/restless  0 0 0 0  Suicidal thoughts  0 0 0 0  PHQ-9 Score  1 1 1  0  Difficult doing work/chores  Not difficult at all Somewhat difficult Not difficult at all Not difficult at all       04/16/2024   10:52 AM 04/05/2024   10:01 AM 01/31/2024    1:49 PM 09/05/2023    1:24 PM  GAD 7 : Generalized Anxiety Score  Nervous, Anxious, on Edge 0 0 0 0  Control/stop worrying 0 0 0 0  Worry too much - different things 0 0 0 0  Trouble relaxing 0 0 0 0  Restless 0 0 0 0  Easily annoyed or irritable 0 0 0 0  Afraid - awful might happen 0 0 0 0  Total GAD 7 Score 0 0 0 0  Anxiety Difficulty Not difficult at all Not difficult at all Not difficult at all Not difficult at all   Relevant past medical, surgical, family and social history reviewed and updated as indicated. Interim medical history since our last visit reviewed. Allergies and  medications reviewed and updated.  Review of Systems  Constitutional:  Negative for activity change, appetite change, diaphoresis, fatigue and fever.  Respiratory:  Negative for cough, chest tightness, shortness of breath and wheezing.   Cardiovascular:  Negative for chest pain, palpitations and leg swelling.  Gastrointestinal:  Negative for abdominal distention, abdominal pain, blood in stool, constipation, diarrhea, nausea and vomiting.  Musculoskeletal:  Positive for arthralgias.  Neurological: Negative.   Psychiatric/Behavioral: Negative.      Per HPI unless specifically indicated above     Objective:    BP 128/74 (BP Location: Left Arm, Patient Position: Sitting, Cuff Size: Normal)   Pulse 94   Temp 98.4 F (36.9 C) (Oral)  Resp 15   Ht 5' 4.49 (1.638 m)   Wt 202 lb (91.6 kg)   SpO2 97%   BMI 34.15 kg/m   Wt Readings from Last 3 Encounters:  08/06/24 202 lb (91.6 kg)  07/16/24 200 lb 3.2 oz (90.8 kg)  04/16/24 196 lb (88.9 kg)    Physical Exam Vitals and nursing note reviewed.  Constitutional:      General: She is awake. She is not in acute distress.    Appearance: She is well-developed and well-groomed. She is obese. She is not ill-appearing or toxic-appearing.  HENT:     Head: Normocephalic and atraumatic.     Right Ear: Hearing normal.     Left Ear: Hearing normal.     Nose: Nose normal.  Eyes:     General: Lids are normal.        Right eye: No discharge.        Left eye: No discharge.     Conjunctiva/sclera: Conjunctivae normal.     Pupils: Pupils are equal, round, and reactive to light.  Neck:     Thyroid : No thyromegaly.     Vascular: No carotid bruit.  Cardiovascular:     Rate and Rhythm: Normal rate and regular rhythm.     Heart sounds: Murmur heard.     Systolic murmur is present with a grade of 2/6.     No gallop.  Pulmonary:     Effort: Pulmonary effort is normal. No accessory muscle usage or respiratory distress.     Breath sounds: Normal  breath sounds. No decreased breath sounds, wheezing or rhonchi.  Abdominal:     General: Bowel sounds are normal. There is no distension.     Palpations: Abdomen is soft. There is no hepatomegaly.     Tenderness: There is no abdominal tenderness.  Musculoskeletal:     Cervical back: Normal range of motion and neck supple.     Right lower leg: 2+ Edema present.     Left lower leg: 2+ Edema present.     Comments: Antalgic gait.  Lymphadenopathy:     Cervical: No cervical adenopathy.  Skin:    General: Skin is warm and dry.  Neurological:     Mental Status: She is alert and oriented to person, place, and time.     Deep Tendon Reflexes: Reflexes are normal and symmetric.     Reflex Scores:      Brachioradialis reflexes are 2+ on the right side and 2+ on the left side.      Patellar reflexes are 2+ on the right side and 2+ on the left side. Psychiatric:        Attention and Perception: Attention normal.        Mood and Affect: Mood normal.        Speech: Speech normal.        Behavior: Behavior normal. Behavior is cooperative.        Thought Content: Thought content normal.    Results for orders placed or performed in visit on 07/16/24  Vitamin B12   Collection Time: 07/16/24  2:10 PM  Result Value Ref Range   Vitamin B-12 508 180 - 914 pg/mL  Ferritin   Collection Time: 07/16/24  2:10 PM  Result Value Ref Range   Ferritin 14 11 - 307 ng/mL  Iron  and TIBC   Collection Time: 07/16/24  2:10 PM  Result Value Ref Range   Iron  20 (L) 28 - 170 ug/dL   TIBC  564 (H) 250 - 450 ug/dL   Saturation Ratios 4 (L) 10.4 - 31.8 %   UIBC 544 ug/dL  CBC with Differential (Cancer Center Only)   Collection Time: 07/16/24  2:10 PM  Result Value Ref Range   WBC Count 9.0 4.0 - 10.5 K/uL   RBC 4.24 3.87 - 5.11 MIL/uL   Hemoglobin 10.2 (L) 12.0 - 15.0 g/dL   HCT 65.1 (L) 63.9 - 53.9 %   MCV 82.1 80.0 - 100.0 fL   MCH 24.1 (L) 26.0 - 34.0 pg   MCHC 29.3 (L) 30.0 - 36.0 g/dL   RDW 84.8 88.4 -  84.4 %   Platelet Count 394 150 - 400 K/uL   nRBC 0.0 0.0 - 0.2 %   Neutrophils Relative % 74 %   Neutro Abs 6.7 1.7 - 7.7 K/uL   Lymphocytes Relative 13 %   Lymphs Abs 1.2 0.7 - 4.0 K/uL   Monocytes Relative 9 %   Monocytes Absolute 0.8 0.1 - 1.0 K/uL   Eosinophils Relative 3 %   Eosinophils Absolute 0.2 0.0 - 0.5 K/uL   Basophils Relative 1 %   Basophils Absolute 0.1 0.0 - 0.1 K/uL   Immature Granulocytes 0 %   Abs Immature Granulocytes 0.04 0.00 - 0.07 K/uL      Assessment & Plan:   Problem List Items Addressed This Visit       Cardiovascular and Mediastinum   Paroxysmal atrial fibrillation (HCC) - Primary   Chronic, ongoing, rate controlled.  Followed by cardiology.  Continue this collaboration and current medication regimen as prescribed by them, including Eliquis. Recent notes reviewed. Will repeat echo since is having more edema to ankles/feet + had recent episode of abdominal distension, now improved.  Last echo was 2022.      Relevant Orders   ECHOCARDIOGRAM COMPLETE   Essential hypertension   Chronic, ongoing.  BP at goal today in office.  Recommend she monitor BP at least a few mornings a week at home and document.  DASH diet at home.  Continue current medication regimen and adjust as needed + collaboration with cardiology.  Labs today: Lipid, CMP.  Will repeat echo since is having more edema to ankles/feet + had recent episode of abdominal distension, now improved.  Last echo was 2022.      Relevant Orders   Comprehensive metabolic panel with GFR   Aortic atherosclerosis   Ongoing.  Noted on CT 01/03/2020 -- discussed with patient and educated on this.  Continue Rosuvastatin  daily and ASA.        Respiratory   Asthma without status asthmaticus   Chronic, ongoing, followed by pulmonary with last visit in 2023.  Continue current inhaler regimen and adjust as needed.  Currently no exacerbation.      Relevant Medications   fluticasone  furoate-vilanterol (BREO  ELLIPTA) 200-25 MCG/ACT AEPB     Endocrine   Thyroid  disease   Recent TSH within normal range, no current medications.  Check TSH and Free T4 today.      Relevant Orders   TSH   T4, free     Nervous and Auditory   Chronic bilateral low back pain with sciatica   Chronic, scheduled for MRI tomorrow with [facility].  Will send in Xanax to take prior to procedure due to her claustrophobia.        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  Continue current medication regimen and adjust as needed.          Other   Vitamin D  deficiency   Chronic, ongoing.  Check level today and recommend she take supplement as ordered.      Relevant Orders   VITAMIN D  25 Hydroxy (Vit-D Deficiency, Fractures)   Iron  deficiency anemia   Chronic, stable, followed by hematology. Recent notes reviewed. Continue collaboration with them and infusions.      Hyperlipemia   Chronic, ongoing.   Continue Rosuvastatin  and adjust dosing as needed. Recheck lipid panel and CMP today.      Relevant Orders   Comprehensive metabolic panel with GFR   Lipid Panel w/o Chol/HDL Ratio   Fibromyalgia   Chronic, ongoing.  No benefit from Amitriptyline in past and Gabapentin  caused a rash.  Duloxetine  is offering her benefit, less pain, continue 60 MG.  Educated her on this and side effects.  Continue to collaborate with physiatry and ortho as needed.  Discussed with patient that she may benefit referral to pain clinic in future if ongoing discomfort and poor control.      Relevant Medications   traMADol (ULTRAM) 50 MG tablet   DULoxetine  (CYMBALTA ) 60 MG capsule     Follow up plan: Return for as scheduled on October 17th.

## 2024-08-06 NOTE — Assessment & Plan Note (Signed)
Chronic, stable, followed by hematology. Recent notes reviewed. Continue collaboration with them and infusions.

## 2024-08-06 NOTE — Assessment & Plan Note (Signed)
Ongoing.  Noted on CT 01/03/2020 -- discussed with patient and educated on this.  Continue Rosuvastatin daily and ASA.

## 2024-08-06 NOTE — Assessment & Plan Note (Signed)
Recent TSH within normal range, no current medications.  Check TSH and Free T4 today.

## 2024-08-07 ENCOUNTER — Ambulatory Visit: Payer: Self-pay | Admitting: Nurse Practitioner

## 2024-08-07 LAB — VITAMIN D 25 HYDROXY (VIT D DEFICIENCY, FRACTURES): Vit D, 25-Hydroxy: 50.5 ng/mL (ref 30.0–100.0)

## 2024-08-07 LAB — COMPREHENSIVE METABOLIC PANEL WITH GFR
ALT: 19 IU/L (ref 0–32)
AST: 24 IU/L (ref 0–40)
Albumin: 4.5 g/dL (ref 3.8–4.8)
Alkaline Phosphatase: 115 IU/L (ref 49–135)
BUN/Creatinine Ratio: 20 (ref 12–28)
BUN: 16 mg/dL (ref 8–27)
Bilirubin Total: 0.2 mg/dL (ref 0.0–1.2)
CO2: 24 mmol/L (ref 20–29)
Calcium: 10.4 mg/dL — ABNORMAL HIGH (ref 8.7–10.3)
Chloride: 100 mmol/L (ref 96–106)
Creatinine, Ser: 0.82 mg/dL (ref 0.57–1.00)
Globulin, Total: 2.3 g/dL (ref 1.5–4.5)
Glucose: 95 mg/dL (ref 70–99)
Potassium: 4.6 mmol/L (ref 3.5–5.2)
Sodium: 140 mmol/L (ref 134–144)
Total Protein: 6.8 g/dL (ref 6.0–8.5)
eGFR: 75 mL/min/1.73 (ref >59)

## 2024-08-07 LAB — LIPID PANEL W/O CHOL/HDL RATIO
Cholesterol, Total: 153 mg/dL (ref 100–199)
HDL: 59 mg/dL (ref >39)
LDL Chol Calc (NIH): 65 mg/dL (ref 0–99)
Triglycerides: 172 mg/dL — ABNORMAL HIGH (ref 0–149)
VLDL Cholesterol Cal: 29 mg/dL (ref 5–40)

## 2024-08-07 LAB — T4, FREE: Free T4: 1.07 ng/dL (ref 0.82–1.77)

## 2024-08-07 LAB — TSH: TSH: 3.94 u[IU]/mL (ref 0.450–4.500)

## 2024-08-07 NOTE — Progress Notes (Signed)
 Contacted via MyChart  Good morning Mary Irwin, your labs have returned and are overall stable.  No changes needed.  Any questions? Keep being stellar!!  Thank you for allowing me to participate in your care.  I appreciate you. Kindest regards, Ammarie Matsuura

## 2024-08-15 ENCOUNTER — Inpatient Hospital Stay: Payer: Medicare (Managed Care) | Attending: Internal Medicine

## 2024-08-15 VITALS — BP 158/64 | HR 88 | Temp 96.3°F | Resp 16

## 2024-08-15 DIAGNOSIS — E538 Deficiency of other specified B group vitamins: Secondary | ICD-10-CM | POA: Insufficient documentation

## 2024-08-15 DIAGNOSIS — D509 Iron deficiency anemia, unspecified: Secondary | ICD-10-CM | POA: Insufficient documentation

## 2024-08-15 MED ORDER — IRON SUCROSE 20 MG/ML IV SOLN
200.0000 mg | Freq: Once | INTRAVENOUS | Status: AC
  Administered 2024-08-15: 200 mg via INTRAVENOUS
  Filled 2024-08-15: qty 10

## 2024-08-15 MED ORDER — CYANOCOBALAMIN 1000 MCG/ML IJ SOLN
1000.0000 ug | Freq: Once | INTRAMUSCULAR | Status: AC
  Administered 2024-08-15: 1000 ug via INTRAMUSCULAR
  Filled 2024-08-15: qty 1

## 2024-08-15 MED ORDER — SODIUM CHLORIDE 0.9% FLUSH
10.0000 mL | Freq: Once | INTRAVENOUS | Status: AC | PRN
  Administered 2024-08-15: 10 mL
  Filled 2024-08-15: qty 10

## 2024-08-18 NOTE — Patient Instructions (Incomplete)
 Edema  Edema is when you have too much fluid in your body or under your skin. Edema may make your legs, feet, and ankles swell. Swelling often happens in looser tissues, such as around your eyes. This is a common condition. It gets more common as you get older. There are many possible causes of edema. These include: Eating too much salt (sodium). Being on your feet or sitting for a long time. Certain medical conditions, such as: Pregnancy. Heart failure. Liver disease. Kidney disease. Cancer. Hot weather may make edema worse. Edema is usually painless. Your skin may look swollen or shiny. Follow these instructions at home: Medicines Take over-the-counter and prescription medicines only as told by your doctor. Your doctor may prescribe a medicine to help your body get rid of extra water (diuretic). Take this medicine if you are told to take it. Eating and drinking Eat a low-salt (low-sodium) diet as told by your doctor. Sometimes, eating less salt may reduce swelling. Depending on the cause of your swelling, you may need to limit how much fluid you drink (fluid restriction). General instructions Raise the injured area above the level of your heart while you are sitting or lying down. Do not sit still or stand for a long time. Do not wear tight clothes. Do not wear garters on your upper legs. Exercise your legs. This can help the swelling go down. Wear compression stockings as told by your doctor. It is important that these are the right size. These should be prescribed by your doctor to prevent possible injuries. If elastic bandages or wraps are recommended, use them as told by your doctor. Contact a doctor if: Treatment is not working. You have heart, liver, or kidney disease and have symptoms of edema. You have sudden and unexplained weight gain. Get help right away if: You have shortness of breath or chest pain. You cannot breathe when you lie down. You have pain, redness, or  warmth in the swollen areas. You have heart, liver, or kidney disease and get edema all of a sudden. You have a fever and your symptoms get worse all of a sudden. These symptoms may be an emergency. Get help right away. Call 911. Do not wait to see if the symptoms will go away. Do not drive yourself to the hospital. Summary Edema is when you have too much fluid in your body or under your skin. Edema may make your legs, feet, and ankles swell. Swelling often happens in looser tissues, such as around your eyes. Raise the injured area above the level of your heart while you are sitting or lying down. Follow your doctor's instructions about diet and how much fluid you can drink. This information is not intended to replace advice given to you by your health care provider. Make sure you discuss any questions you have with your health care provider. Document Revised: 06/28/2021 Document Reviewed: 06/28/2021 Elsevier Patient Education  2024 [employer].

## 2024-08-20 ENCOUNTER — Other Ambulatory Visit: Payer: Self-pay | Admitting: Nurse Practitioner

## 2024-08-22 NOTE — Telephone Encounter (Signed)
 Too soon for refill. LRF 08/06/24.  Requested Prescriptions  Pending Prescriptions Disp Refills   BREO ELLIPTA  200-25 MCG/ACT AEPB [Pharmacy Med Name: BREO ELLIPTA  200-25MCG ORAL INH(30)] 180 each     Sig: INHALE 1 PUFF INTO THE LUNGS DAILY     Pulmonology:  [facility] [employer] Passed - 08/22/2024 12:08 PM      Passed - Valid encounter within last 12 months    Recent Outpatient Visits           2 weeks ago Paroxysmal atrial fibrillation (HCC)   [facility] [place], Joseph T, NP   4 months ago Pneumonia of left lower lobe due to infectious organism   [facility] [place], [place] T, NP   4 months ago Pneumonia of left lower lobe due to infectious organism   [facility] [place], Melanie T, NP   6 months ago Paroxysmal atrial fibrillation [facility])   [facility] [place], Melanie DASEN, NP

## 2024-08-23 ENCOUNTER — Ambulatory Visit: Admitting: Nurse Practitioner

## 2024-09-07 NOTE — Patient Instructions (Signed)
 Edema  Edema is when you have too much fluid in your body or under your skin. Edema may make your legs, feet, and ankles swell. Swelling often happens in looser tissues, such as around your eyes. This is a common condition. It gets more common as you get older. There are many possible causes of edema. These include: Eating too much salt (sodium). Being on your feet or sitting for a long time. Certain medical conditions, such as: Pregnancy. Heart failure. Liver disease. Kidney disease. Cancer. Hot weather may make edema worse. Edema is usually painless. Your skin may look swollen or shiny. Follow these instructions at home: Medicines Take over-the-counter and prescription medicines only as told by your doctor. Your doctor may prescribe a medicine to help your body get rid of extra water (diuretic). Take this medicine if you are told to take it. Eating and drinking Eat a low-salt (low-sodium) diet as told by your doctor. Sometimes, eating less salt may reduce swelling. Depending on the cause of your swelling, you may need to limit how much fluid you drink (fluid restriction). General instructions Raise the injured area above the level of your heart while you are sitting or lying down. Do not sit still or stand for a long time. Do not wear tight clothes. Do not wear garters on your upper legs. Exercise your legs. This can help the swelling go down. Wear compression stockings as told by your doctor. It is important that these are the right size. These should be prescribed by your doctor to prevent possible injuries. If elastic bandages or wraps are recommended, use them as told by your doctor. Contact a doctor if: Treatment is not working. You have heart, liver, or kidney disease and have symptoms of edema. You have sudden and unexplained weight gain. Get help right away if: You have shortness of breath or chest pain. You cannot breathe when you lie down. You have pain, redness, or  warmth in the swollen areas. You have heart, liver, or kidney disease and get edema all of a sudden. You have a fever and your symptoms get worse all of a sudden. These symptoms may be an emergency. Get help right away. Call 911. Do not wait to see if the symptoms will go away. Do not drive yourself to the hospital. Summary Edema is when you have too much fluid in your body or under your skin. Edema may make your legs, feet, and ankles swell. Swelling often happens in looser tissues, such as around your eyes. Raise the injured area above the level of your heart while you are sitting or lying down. Follow your doctor's instructions about diet and how much fluid you can drink. This information is not intended to replace advice given to you by your health care provider. Make sure you discuss any questions you have with your health care provider. Document Revised: 06/28/2021 Document Reviewed: 06/28/2021 Elsevier Patient Education  2024 [employer].

## 2024-09-11 ENCOUNTER — Encounter: Payer: Self-pay | Admitting: Nurse Practitioner

## 2024-09-11 ENCOUNTER — Ambulatory Visit (INDEPENDENT_AMBULATORY_CARE_PROVIDER_SITE_OTHER): Payer: Medicare (Managed Care) | Admitting: Nurse Practitioner

## 2024-09-11 VITALS — BP 134/80 | HR 63 | Temp 98.3°F | Ht 64.5 in | Wt 201.0 lb

## 2024-09-11 DIAGNOSIS — I480 Paroxysmal atrial fibrillation: Secondary | ICD-10-CM | POA: Diagnosis not present

## 2024-09-11 NOTE — Progress Notes (Signed)
 BP 134/80   Pulse 63   Temp 98.3 F (36.8 C) (Oral)   Ht 5' 4.5 (1.638 m)   Wt 201 lb (91.2 kg)   SpO2 98%   BMI 33.97 kg/m    Subjective:    Patient ID: Mary Irwin, female    DOB: 1951-06-25, 73 y.o.   MRN: [id number]  HPI: Mary Irwin is a 73 y.o. female  Chief Complaint  Patient presents with   Follow-up   Foot Swelling   Atrial Fibrillation   ATRIAL FIBRILLATION Was seen by cardiology on 08/29/24 and told to start wearing compression for edema and they reduced her Cardizem . An echo was performed which noted EF 55%.  Is not wearing compression all day, but some of the time. Wears for about 4-5 hours. Returns to see cardiology tomorrow. Atrial fibrillation status: stable Satisfied with current treatment: yes  Medication side effects:  no Medication compliance: good compliance Etiology of atrial fibrillation: unknown Palpitations:  no Chest pain:  no Dyspnea on exertion:  no Orthopnea:  no Syncope:  no Edema:  yes - has improved a little bit but not 100% Ventricular rate control: diltiazem  Anti-coagulation: long acting   Relevant past medical, surgical, family and social history reviewed and updated as indicated. Interim medical history since our last visit reviewed. Allergies and medications reviewed and updated.  Review of Systems  Constitutional:  Negative for activity change, appetite change, diaphoresis, fatigue and fever.  Respiratory:  Negative for cough, chest tightness, shortness of breath and wheezing.   Cardiovascular:  Positive for leg swelling. Negative for chest pain and palpitations.  Gastrointestinal:  Negative for abdominal distention, abdominal pain, blood in stool, constipation, diarrhea, nausea and vomiting.  Musculoskeletal:  Positive for arthralgias.  Neurological: Negative.   Psychiatric/Behavioral: Negative.      Per HPI unless specifically indicated above     Objective:    BP 134/80   Pulse 63   Temp 98.3 F (36.8 C)  (Oral)   Ht 5' 4.5 (1.638 m)   Wt 201 lb (91.2 kg)   SpO2 98%   BMI 33.97 kg/m   Wt Readings from Last 3 Encounters:  09/11/24 201 lb (91.2 kg)  08/06/24 202 lb (91.6 kg)  07/16/24 200 lb 3.2 oz (90.8 kg)    Physical Exam Vitals and nursing note reviewed.  Constitutional:      General: She is awake. She is not in acute distress.    Appearance: She is well-developed and well-groomed. She is obese. She is not ill-appearing or toxic-appearing.  HENT:     Head: Normocephalic and atraumatic.     Right Ear: Hearing normal.     Left Ear: Hearing normal.     Nose: Nose normal.  Eyes:     General: Lids are normal.        Right eye: No discharge.        Left eye: No discharge.     Conjunctiva/sclera: Conjunctivae normal.     Pupils: Pupils are equal, round, and reactive to light.  Neck:     Thyroid : No thyromegaly.     Vascular: No carotid bruit.  Cardiovascular:     Rate and Rhythm: Normal rate. Rhythm irregularly irregular.     Heart sounds: Murmur heard.     Systolic murmur is present with a grade of 2/6.     No gallop.     Comments: Improved swelling from past visit. Pulmonary:     Effort: Pulmonary effort is normal.  No accessory muscle usage or respiratory distress.     Breath sounds: Normal breath sounds. No decreased breath sounds, wheezing or rhonchi.  Abdominal:     General: Bowel sounds are normal. There is no distension.     Palpations: Abdomen is soft. There is no hepatomegaly.     Tenderness: There is no abdominal tenderness.  Musculoskeletal:     Cervical back: Normal range of motion and neck supple.     Right lower leg: 1+ Edema present.     Left lower leg: 1+ Edema present.     Comments: Antalgic gait.  Lymphadenopathy:     Cervical: No cervical adenopathy.  Skin:    General: Skin is warm and dry.  Neurological:     Mental Status: She is alert and oriented to person, place, and time.     Deep Tendon Reflexes: Reflexes are normal and symmetric.     Reflex  Scores:      Brachioradialis reflexes are 2+ on the right side and 2+ on the left side.      Patellar reflexes are 2+ on the right side and 2+ on the left side. Psychiatric:        Attention and Perception: Attention normal.        Mood and Affect: Mood normal.        Speech: Speech normal.        Behavior: Behavior normal. Behavior is cooperative.        Thought Content: Thought content normal.     Results for orders placed or performed in visit on 08/06/24  Comprehensive metabolic panel with GFR   Collection Time: 08/06/24  3:10 PM  Result Value Ref Range   Glucose 95 70 - 99 mg/dL   BUN 16 8 - 27 mg/dL   Creatinine, Ser 9.17 0.57 - 1.00 mg/dL   eGFR 75 >40 fO/fpw/8.26   BUN/Creatinine Ratio 20 12 - 28   Sodium 140 134 - 144 mmol/L   Potassium 4.6 3.5 - 5.2 mmol/L   Chloride 100 96 - 106 mmol/L   CO2 24 20 - 29 mmol/L   Calcium  10.4 (H) 8.7 - 10.3 mg/dL   Total Protein 6.8 6.0 - 8.5 g/dL   Albumin 4.5 3.8 - 4.8 g/dL   Globulin, Total 2.3 1.5 - 4.5 g/dL   Bilirubin Total 0.2 0.0 - 1.2 mg/dL   Alkaline Phosphatase 115 49 - 135 IU/L   AST 24 0 - 40 IU/L   ALT 19 0 - 32 IU/L  TSH   Collection Time: 08/06/24  3:10 PM  Result Value Ref Range   TSH 3.940 0.450 - 4.500 uIU/mL  Lipid Panel w/o Chol/HDL Ratio   Collection Time: 08/06/24  3:10 PM  Result Value Ref Range   Cholesterol, Total 153 100 - 199 mg/dL   Triglycerides 827 (H) 0 - 149 mg/dL   HDL 59 >60 mg/dL   VLDL Cholesterol Cal 29 5 - 40 mg/dL   LDL Chol Calc (NIH) 65 0 - 99 mg/dL  T4, free   Collection Time: 08/06/24  3:10 PM  Result Value Ref Range   Free T4 1.07 0.82 - 1.77 ng/dL  VITAMIN D  25 Hydroxy (Vit-D Deficiency, Fractures)   Collection Time: 08/06/24  3:10 PM  Result Value Ref Range   Vit D, 25-Hydroxy 50.5 30.0 - 100.0 ng/mL      Assessment & Plan:   Problem List Items Addressed This Visit       Cardiovascular and Mediastinum  Paroxysmal atrial fibrillation (HCC) - Primary   Chronic,  ongoing, rate controlled.  Followed by cardiology.  Continue this collaboration and current medication regimen as prescribed by them, including Eliquis. Recent notes reviewed. Recent echo remains stable with EF 55%, similar to 2020.  Sees cardiology tomorrow, defer changes to them. Her edema has improved some today.      Relevant Medications   diltiazem  (CARDIZEM  CD) 240 MG 24 hr capsule     Follow up plan: Return in about 3 months (around 12/12/2024) for HTN/HLD, A-FIB.

## 2024-09-11 NOTE — Assessment & Plan Note (Signed)
 Chronic, ongoing, rate controlled.  Followed by cardiology.  Continue this collaboration and current medication regimen as prescribed by them, including Eliquis. Recent notes reviewed. Recent echo remains stable with EF 55%, similar to 2020.  Sees cardiology tomorrow, defer changes to them. Her edema has improved some today.

## 2024-09-13 ENCOUNTER — Other Ambulatory Visit: Payer: Self-pay | Admitting: Physician Assistant

## 2024-09-13 DIAGNOSIS — I480 Paroxysmal atrial fibrillation: Secondary | ICD-10-CM

## 2024-09-13 DIAGNOSIS — I517 Cardiomegaly: Secondary | ICD-10-CM

## 2024-09-13 DIAGNOSIS — R0609 Other forms of dyspnea: Secondary | ICD-10-CM

## 2024-09-16 ENCOUNTER — Inpatient Hospital Stay: Payer: Medicare (Managed Care) | Attending: Internal Medicine

## 2024-09-16 VITALS — BP 129/73 | HR 77 | Temp 98.5°F | Resp 14

## 2024-09-16 DIAGNOSIS — D509 Iron deficiency anemia, unspecified: Secondary | ICD-10-CM

## 2024-09-16 MED ORDER — CYANOCOBALAMIN 1000 MCG/ML IJ SOLN
1000.0000 ug | Freq: Once | INTRAMUSCULAR | Status: AC
  Administered 2024-09-16: 1000 ug via INTRAMUSCULAR
  Filled 2024-09-16: qty 1

## 2024-09-16 MED ORDER — IRON SUCROSE 20 MG/ML IV SOLN
200.0000 mg | Freq: Once | INTRAVENOUS | Status: AC
  Administered 2024-09-16: 200 mg via INTRAVENOUS
  Filled 2024-09-16: qty 10

## 2024-09-16 NOTE — Patient Instructions (Signed)

## 2024-09-17 ENCOUNTER — Ambulatory Visit

## 2024-09-19 ENCOUNTER — Other Ambulatory Visit: Payer: Self-pay | Admitting: Nurse Practitioner

## 2024-09-21 NOTE — Telephone Encounter (Signed)
 Discontinued by provider on 02/02/24, refusing due to this.   Requested Prescriptions  Pending Prescriptions Disp Refills   rosuvastatin  (CRESTOR ) 10 MG tablet [Pharmacy Med Name: ROSUVASTATIN  10MG  TABLETS] 90 tablet 4    Sig: TAKE 1 TABLET(10 MG) BY MOUTH DAILY     Cardiovascular:  Antilipid - Statins 2 Failed - 09/21/2024  9:13 AM      Failed - Lipid Panel in normal range within the last 12 months    Cholesterol, Total  Date Value Ref Range Status  08/06/2024 153 100 - 199 mg/dL Final   LDL Chol Calc (NIH)  Date Value Ref Range Status  08/06/2024 65 0 - 99 mg/dL Final   HDL  Date Value Ref Range Status  08/06/2024 59 >39 mg/dL Final   Triglycerides  Date Value Ref Range Status  08/06/2024 172 (H) 0 - 149 mg/dL Final         Passed - Cr in normal range and within 360 days    Creatinine, Ser  Date Value Ref Range Status  08/06/2024 0.82 0.57 - 1.00 mg/dL Final         Passed - Patient is not pregnant      Passed - Valid encounter within last 12 months    Recent Outpatient Visits           1 week ago Paroxysmal atrial fibrillation (HCC)   [facility] [place], [place] T, NP   1 month ago Paroxysmal atrial fibrillation (HCC)   [place] [facility] [place], Kendrick T, NP   5 months ago Pneumonia of left lower lobe due to infectious organism   [facility] [place] Torres, [place] T, NP   5 months ago Pneumonia of left lower lobe due to infectious organism   [place] [facility] [place], [place] T, NP   7 months ago Paroxysmal atrial fibrillation [facility])   [facility] [place], Melanie DASEN, NP

## 2024-09-30 ENCOUNTER — Ambulatory Visit: Payer: Self-pay

## 2024-09-30 NOTE — Telephone Encounter (Signed)
 Scheduled in [place]

## 2024-09-30 NOTE — Telephone Encounter (Signed)
 FYI Only or Action Required?: Action required by provider: Med request for sx\.  Patient was last seen in primary care on 09/11/2024 by Valerio Melanie DASEN, NP.  Called Nurse Triage reporting Sore Throat and foot cramps.  Symptoms began several days ago.  Interventions attempted: Nothing.  Symptoms are: stable.  Triage Disposition: Home Care  Patient/caregiver understands and will follow disposition?: Yes Reason for Disposition  [1] Sore throat with cough/cold symptoms AND [2] present < 5 days  Answer Assessment - Initial Assessment Questions Patient has not taken anything OTC. Has afib. Denies any lung conditions. Patient states she feels so bad she hasn't gone anywhere and does not want to do go anywhere. Patient is requesting something be called in to [facility] pharmacy on file.   1. ONSET: When did the throat start hurting? (Hours or days ago)      4 days  2.  VIRAL SYMPTOMS: Are there any symptoms of a cold, such as a runny nose, cough, hoarse voice or red eyes?      Cough, headaches  3. FEVER: Do you have a fever? If Yes, ask: What is your temperature, how was it measured, and when did it start?     99 about an hour ago  4. OTHER SYMPTOMS: Do you have any other symptoms? (e.g., difficulty breathing, headache, rash)     Nausea yesterday, cramping in feet and legs  Protocols used: Sore Throat-A-AH  Copied from CRM #[id number]. Topic: Clinical - Medical Advice >> Sep 30, 2024  2:00 PM Shanda MATSU wrote: Reason for CRM: Patient is reporting sore throat for 4 days, cramping in both right and left foot, nauseous, caller is wanting to know if something can be called in for her.

## 2024-10-01 ENCOUNTER — Ambulatory Visit (INDEPENDENT_AMBULATORY_CARE_PROVIDER_SITE_OTHER): Payer: Medicare (Managed Care) | Admitting: Family Medicine

## 2024-10-01 ENCOUNTER — Encounter: Payer: Self-pay | Admitting: Family Medicine

## 2024-10-01 VITALS — BP 120/60 | HR 76 | Ht 64.5 in | Wt 191.0 lb

## 2024-10-01 DIAGNOSIS — J029 Acute pharyngitis, unspecified: Secondary | ICD-10-CM | POA: Diagnosis not present

## 2024-10-01 DIAGNOSIS — J0110 Acute frontal sinusitis, unspecified: Secondary | ICD-10-CM | POA: Diagnosis not present

## 2024-10-01 LAB — POCT INFLUENZA A/B
Influenza A, POC: NEGATIVE
Influenza B, POC: NEGATIVE

## 2024-10-01 LAB — POC COVID19 BINAXNOW: SARS Coronavirus 2 Ag: NEGATIVE

## 2024-10-01 MED ORDER — ONDANSETRON 4 MG PO TBDP: 4.0000 mg | ORAL_TABLET | Freq: Three times a day (TID) | ORAL | 0 refills | Status: AC | PRN

## 2024-10-01 MED ORDER — AZELASTINE HCL 0.1 % NA SOLN
2.0000 | Freq: Two times a day (BID) | NASAL | 0 refills | Status: AC
Start: 2024-10-01 — End: ?

## 2024-10-01 NOTE — Patient Instructions (Addendum)
 VISIT SUMMARY:  During your visit, we discussed your sore throat, dizziness, nausea, and other symptoms. We reviewed your recent medication changes and provided a treatment plan to address your acute sinusitis and associated symptoms.  YOUR PLAN:  ACUTE SINUSITIS WITH ASSOCIATED SYMPTOMS:  -Take Zofran  as needed for nausea. -Use Astelin  nasal spray twice daily. -Take Mucinex  12-hour, one tablet twice daily. -Use Flonase nasal spray, two sprays in each nostril nightly. -Take a daily multivitamin without iron  if your stomach is sensitive. -Stay hydrated and try to eat small, nutritious meals. Consider drinking Ensure if needed. -Monitor your symptoms and call us  if they worsen or do not improve by Friday. -Follow up with your primary care provider by Friday or early next week.

## 2024-10-01 NOTE — Progress Notes (Signed)
 Primary Care / Sports Medicine Office Visit  Patient Information:  Patient ID: Mary Irwin, female DOB: [birth year] Age: 73 y.o. MRN: [id number]   Mary Irwin is a pleasant 73 y.o. female presenting with the following:  Chief Complaint  Patient presents with   Sore Throat    Sore throat since Friday 09/27/24. Patient has been feeling dizzy, low grade fever with cough.    Vitals:   10/01/24 1114  BP: 120/60  Pulse: 76  SpO2: 98%   Vitals:   10/01/24 1114  Weight: 191 lb (86.6 kg)  Height: 5' 4.5 (1.638 m)   Body mass index is 32.28 kg/m.  No results found.   Discussed the use of AI scribe software for clinical note transcription with the patient, who gave verbal consent to proceed.   Independent interpretation of notes and tests performed by another provider:   None  Procedures performed:   None  Pertinent History, Exam, Impression, and Recommendations:   Problem List Items Addressed This Visit     Acute frontal sinusitis - Primary   History of Present Illness Mary Irwin is a 73 year old female who presents with sore throat, dizziness, and nausea.  Pharyngitis and upper respiratory symptoms - Sore throat onset Friday, November 21, improving by day of visit - Dry cough began Monday, November 24, non-productive, no nocturnal worsening - No difficulty breathing or chest tightness - No morning mucus production from nose or throat - Low-grade fever since symptom onset - Recent negative COVID-19 and influenza tests - Received influenza vaccination - Exposure to febrile son-in-law on Friday  Gastrointestinal symptoms - Nausea began Saturday, November 22, persistent without vomiting - Attempted to vomit but unable to do so - Decreased appetite due to queasiness - Recent unintentional weight loss of approximately ten pounds  Neuromuscular symptoms - Dizziness and disequilibrium began Saturday, November 22 - Cramps in feet and legs on night of  November 22 - Hand cramps since Sunday, November 23 - Hand tremors present  Medication changes - Started spironolactone approximately two weeks ago - Current medications include albuterol , alprazolam , apixaban, diltiazem , duloxetine , fluticasone , hyoscyamine sulfate, Linzess , and rosuvastatin  - Spironolactone not yet added to medication list  Physical Exam VITALS: P- 76, BP- 120/60, SaO2- 98% MEASUREMENTS: Weight- 205. HEENT: Eardrums dull bilaterally. Oral cavity normal. Nasal mucosa erythematous. Lips pale. NECK: Neck supple, no tenderness. CHEST: Lungs clear to auscultation bilaterally. CARDIOVASCULAR: Heart regular rate and rhythm. EXTREMITIES: Capillary refill normal.  Results LABS COVID-19 test: negative Influenza test: negative  Assessment and Plan Acute sinusitis with associated pharyngitis, nausea, dizziness, muscle cramps, and poor oral intake Acute sinusitis likely viral. Differential includes bacterial sinusitis, but antibiotics not recommended due to potential gastrointestinal side effects and current nausea. Concerns about electrolyte imbalance due to spironolactone and poor oral intake. No COVID-19 or influenza. Dull eardrums indicate chronic irritation. Pale lips suggest possible dehydration or anemia. - Prescribed Zofran  for nausea as needed. - Prescribed Astelin  nasal spray twice daily. - Recommended Mucinex  12-hour, one tablet twice daily. - Recommended Flonase nasal spray, two sprays each nostril nightly. - Advised daily multivitamin without iron  if stomach sensitive. - Encouraged hydration and nutritional intake, including small, nutritious meals or Ensure if needed. - Instructed to monitor symptoms and call for antibiotics if symptoms worsen or do not improve by Friday. - Scheduled follow-up with primary care provider for early next week.      Relevant Medications   azelastine  (ASTELIN ) 0.1 % nasal spray  ondansetron  (ZOFRAN -ODT) 4 MG disintegrating tablet    Other Visit Diagnoses       Sore throat       Relevant Orders   POC COVID-19 BinaxNow (Completed)   POCT Influenza A/B (Completed)        Orders & Medications Medications:  Meds ordered this encounter  Medications   azelastine  (ASTELIN ) 0.1 % nasal spray    Sig: Place 2 sprays into both nostrils 2 (two) times daily. Use in each nostril as directed    Dispense:  30 mL    Refill:  0    Use generic Astelin    ondansetron  (ZOFRAN -ODT) 4 MG disintegrating tablet    Sig: Take 1 tablet (4 mg total) by mouth every 8 (eight) hours as needed for up to 10 days for nausea or vomiting.    Dispense:  20 tablet    Refill:  0   Orders Placed This Encounter  Procedures   POC COVID-19 BinaxNow   POCT Influenza A/B     No follow-ups on file.     Mary JINNY Ku, MD, [facility]   Primary Care Sports Medicine Primary Care and Sports Medicine at [facility] [place]

## 2024-10-01 NOTE — Assessment & Plan Note (Addendum)
 History of Present Illness Mary Irwin is a 73 year old female who presents with sore throat, dizziness, and nausea.  Pharyngitis and upper respiratory symptoms - Sore throat onset Friday, November 21, improving by day of visit - Dry cough began Monday, November 24, non-productive, no nocturnal worsening - No difficulty breathing or chest tightness - No morning mucus production from nose or throat - Low-grade fever since symptom onset - Recent negative COVID-19 and influenza tests - Received influenza vaccination - Exposure to febrile son-in-law on Friday  Gastrointestinal symptoms - Nausea began Saturday, November 22, persistent without vomiting - Attempted to vomit but unable to do so - Decreased appetite due to queasiness - Recent unintentional weight loss of approximately ten pounds  Neuromuscular symptoms - Dizziness and disequilibrium began Saturday, November 22 - Cramps in feet and legs on night of November 22 - Hand cramps since Sunday, November 23 - Hand tremors present  Medication changes - Started spironolactone approximately two weeks ago - Current medications include albuterol , alprazolam , apixaban, diltiazem , duloxetine , fluticasone , hyoscyamine sulfate, Linzess , and rosuvastatin  - Spironolactone not yet added to medication list  Physical Exam VITALS: P- 76, BP- 120/60, SaO2- 98% MEASUREMENTS: Weight- 205. HEENT: Eardrums dull bilaterally. Oral cavity normal. Nasal mucosa erythematous. Lips pale. NECK: Neck supple, no tenderness. CHEST: Lungs clear to auscultation bilaterally. CARDIOVASCULAR: Heart regular rate and rhythm. EXTREMITIES: Capillary refill normal.  Results LABS COVID-19 test: negative Influenza test: negative  Assessment and Plan Acute sinusitis with associated pharyngitis, nausea, dizziness, muscle cramps, and poor oral intake Acute sinusitis likely viral. Differential includes bacterial sinusitis, but antibiotics not recommended due to  potential gastrointestinal side effects and current nausea. Concerns about electrolyte imbalance due to spironolactone and poor oral intake. No COVID-19 or influenza. Dull eardrums indicate chronic irritation. Pale lips suggest possible dehydration or anemia. - Prescribed Zofran  for nausea as needed. - Prescribed Astelin  nasal spray twice daily. - Recommended Mucinex  12-hour, one tablet twice daily. - Recommended Flonase nasal spray, two sprays each nostril nightly. - Advised daily multivitamin without iron  if stomach sensitive. - Encouraged hydration and nutritional intake, including small, nutritious meals or Ensure if needed. - Instructed to monitor symptoms and call for antibiotics if symptoms worsen or do not improve by Friday. - Scheduled follow-up with primary care provider for early next week.

## 2024-10-04 ENCOUNTER — Encounter (HOSPITAL_COMMUNITY): Payer: Self-pay

## 2024-10-04 NOTE — Patient Instructions (Signed)
 Be Involved in Caring For Your Health:  Taking Medications When medications are taken as directed, they can greatly improve your health. But if they are not taken as prescribed, they may not work. In some cases, not taking them correctly can be harmful. To help ensure your treatment remains effective and safe, understand your medications and how to take them. Bring your medications to each visit for review by your provider.  Your lab results, notes, and after visit summary will be available on My Chart. We strongly encourage you to use this feature. If lab results are abnormal the clinic will contact you with the appropriate steps. If the clinic does not contact you assume the results are satisfactory. You can always view your results on My Chart. If you have questions regarding your health or results, please contact the clinic during office hours. You can also ask questions on My Chart.  We at [facility] are grateful that you chose us  to provide your care. We strive to provide evidence-based and compassionate care and are always looking for feedback. If you get a survey from the clinic please complete this so we can hear your opinions.   Sinus Infection, Adult A sinus infection is soreness and swelling (inflammation) of your sinuses. Sinuses are hollow spaces in the bones around your face. They are located: Around your eyes. In the middle of your forehead. Behind your nose. In your cheekbones. Your sinuses and nasal passages are lined with a fluid called mucus. Mucus drains out of your sinuses. Swelling can trap mucus in your sinuses. This lets germs (bacteria, virus, or fungus) grow, which leads to infection. Most of the time, this condition is caused by a virus. What are the causes? Allergies. Asthma. Germs. Things that block your nose or sinuses. Growths in the nose (nasal polyps). Chemicals or irritants in the air. A fungus. This is rare. What increases the risk? Having a  weak body defense system (immune system). Doing a lot of swimming or diving. Using nasal sprays too much. Smoking. What are the signs or symptoms? The main symptoms of this condition are pain and a feeling of pressure around the sinuses. Other symptoms include: Stuffy nose (congestion). This may make it hard to breathe through your nose. Runny nose (drainage). Soreness, swelling, and warmth in the sinuses. A cough that may get worse at night. Being unable to smell and taste. Mucus that collects in the throat or the back of the nose (postnasal drip). This may cause a sore throat or bad breath. Being very tired (fatigued). A fever. How is this diagnosed? Your symptoms. Your medical history. A physical exam. Tests to find out if your condition is short-term (acute) or long-term (chronic). Your doctor may: Check your nose for growths (polyps). Check your sinuses using a tool that has a light on one end (endoscope). Check for allergies or germs. Do imaging tests, such as an MRI or CT scan. How is this treated? Treatment for this condition depends on the cause and whether it is short-term or long-term. If caused by a virus, your symptoms should go away on their own within 10 days. You may be given medicines to relieve symptoms. They include: Medicines that shrink swollen tissue in the nose. A spray that treats swelling of the nostrils. Rinses that help get rid of thick mucus in your nose (nasal saline washes). Medicines that treat allergies (antihistamines). Over-the-counter pain relievers. If caused by bacteria, your doctor may wait to see if you will  get better without treatment. You may be given antibiotic medicine if you have: A very bad infection. A weak body defense system. If caused by growths in the nose, surgery may be needed. Follow these instructions at home: Medicines Take, use, or apply over-the-counter and prescription medicines only as told by your doctor. These may  include nasal sprays. If you were prescribed an antibiotic medicine, take it as told by your doctor. Do not stop taking it even if you start to feel better. Hydrate and humidify  Drink enough water to keep your pee (urine) pale yellow. Use a cool mist humidifier to keep the humidity level in your home above 50%. Breathe in steam for 10-15 minutes, 3-4 times a day, or as told by your doctor. You can do this in the bathroom while a hot shower is running. Try not to spend time in cool or dry air. Rest Rest as much as you can. Sleep with your head raised (elevated). Make sure you get enough sleep each night. General instructions  Put a warm, moist washcloth on your face 3-4 times a day, or as often as told by your doctor. Use nasal saline washes as often as told by your doctor. Wash your hands often with soap and water. If you cannot use soap and water, use hand sanitizer. Do not smoke. Avoid being around people who are smoking (secondhand smoke). Keep all follow-up visits. Contact a doctor if: You have a fever. Your symptoms get worse. Your symptoms do not get better within 10 days. Get help right away if: You have a very bad headache. You cannot stop vomiting. You have very bad pain or swelling around your face or eyes. You have trouble seeing. You feel confused. Your neck is stiff. You have trouble breathing. These symptoms may be an emergency. Get help right away. Call 911. Do not wait to see if the symptoms will go away. Do not drive yourself to the hospital. Summary A sinus infection is swelling of your sinuses. Sinuses are hollow spaces in the bones around your face. This condition is caused by tissues in your nose that become inflamed or swollen. This traps germs. These can lead to infection. If you were prescribed an antibiotic medicine, take it as told by your doctor. Do not stop taking it even if you start to feel better. Keep all follow-up visits. This information is  not intended to replace advice given to you by your health care provider. Make sure you discuss any questions you have with your health care provider. Document Revised: 09/28/2021 Document Reviewed: 09/28/2021 Elsevier Patient Education  2024 [employer].

## 2024-10-07 ENCOUNTER — Encounter: Payer: Self-pay | Admitting: Nurse Practitioner

## 2024-10-07 ENCOUNTER — Ambulatory Visit (INDEPENDENT_AMBULATORY_CARE_PROVIDER_SITE_OTHER): Payer: Medicare (Managed Care) | Admitting: Nurse Practitioner

## 2024-10-07 VITALS — BP 136/79 | HR 74 | Temp 98.4°F | Resp 17 | Ht 64.49 in | Wt 196.6 lb

## 2024-10-07 DIAGNOSIS — Z0000 Encounter for general adult medical examination without abnormal findings: Secondary | ICD-10-CM

## 2024-10-07 DIAGNOSIS — J0110 Acute frontal sinusitis, unspecified: Secondary | ICD-10-CM

## 2024-10-07 NOTE — Progress Notes (Signed)
 Chief Complaint  Patient presents with   URI    Feeling much better.    Medicare Wellness    Completed during today's visit.      Subjective:   Mary Irwin is a 73 y.o. female who presents for a Medicare Annual Wellness Visit.  Allergies (verified) Gabapentin , Lisinopril , and Metoclopramide   UPPER RESPIRATORY TRACT INFECTION Treated on 10/01/24 with nasal spray and Zofran . Reports she is feeling a lot better. Fever: no Cough: no Shortness of breath: no Wheezing: no Chest pain: yes, with cough Chest tightness: no Chest congestion: no Nasal congestion: no Runny nose: no Post nasal drip: no Sneezing: no Sore throat: no Swollen glands: no Sinus pressure: no Headache: no Face pain: no Toothache: no Ear pain: none Ear pressure: none Eyes red/itching:no Eye drainage/crusting: no  Vomiting: no Rash: no Fatigue: yes Sick contacts: yes Strep contacts: no  Context: better Recurrent sinusitis: no Relief with OTC cold/cough medications: yes  Treatments attempted: cold/sinus and mucinex , and medications as above    History: Past Medical History:  Diagnosis Date   Anemia    on iron  supplements   Arthritis    all over   Asthma    once a week   Atrial fibrillation (HCC)    Blood transfusion without reported diagnosis    Chronic seasonal allergic rhinitis due to pollen    Clotting disorder    Diverticulosis    Dysrhythmia    Atrial Fibrillation   Fibromyalgia    GERD (gastroesophageal reflux disease)    Heart murmur    HLD (hyperlipidemia)    Hypertension    Hypothyroidism    no meds   IBS (irritable bowel syndrome)    PONV (postoperative nausea and vomiting)    Psychophysiological insomnia    PVC's (premature ventricular contractions)    Sinus trouble    Past Surgical History:  Procedure Laterality Date   BACK SURGERY     CATARACT EXTRACTION W/PHACO Right 04/22/2019   Procedure: CATARACT EXTRACTION PHACO AND INTRAOCULAR LENS PLACEMENT (IOC)  RIGHT;  Surgeon: Myrna Adine Anes, MD;  Location: [facility];  Service: Ophthalmology;  Laterality: Right;   CATARACT EXTRACTION W/PHACO Left 05/27/2019   Procedure: CATARACT EXTRACTION PHACO AND INTRAOCULAR LENS PLACEMENT (IOC)  LEFT;  Surgeon: Myrna Adine Anes, MD;  Location: [facility];  Service: Ophthalmology;  Laterality: Left;   CHOLECYSTECTOMY N/A 09/22/2017   Procedure: LAPAROSCOPIC CHOLECYSTECTOMY WITH INTRAOPERATIVE CHOLANGIOGRAM;  Surgeon: Desiderio Schanz, MD;  Location: [facility];  Service: General;  Laterality: N/A;   COLONOSCOPY WITH PROPOFOL  N/A 05/23/2017   Procedure: COLONOSCOPY WITH PROPOFOL ;  Surgeon: Gaylyn Gladis PENNER, MD;  Location: [facility];  Service: Endoscopy;  Laterality: N/A;   COLONOSCOPY WITH PROPOFOL  N/A 08/18/2021   Procedure: COLONOSCOPY WITH PROPOFOL ;  Surgeon: Toledo, Ladell POUR, MD;  Location: [facility];  Service: Gastroenterology;  Laterality: N/A;   ESOPHAGOGASTRODUODENOSCOPY (EGD) WITH PROPOFOL  N/A 05/23/2017   Procedure: ESOPHAGOGASTRODUODENOSCOPY (EGD) WITH PROPOFOL ;  Surgeon: Gaylyn Gladis PENNER, MD;  Location: [facility];  Service: Endoscopy;  Laterality: N/A;   ESOPHAGOGASTRODUODENOSCOPY (EGD) WITH PROPOFOL  N/A 07/31/2017   Procedure: ESOPHAGOGASTRODUODENOSCOPY (EGD) WITH PROPOFOL ;  Surgeon: Gaylyn Gladis PENNER, MD;  Location: [facility] END[facility];  Service: Endoscopy;  Laterality: N/A;   ESOPHAGOGASTRODUODENOSCOPY (EGD) WITH PROPOFOL  N/A 08/18/2021   Procedure: ESOPHAGOGASTRODUODENOSCOPY (EGD) WITH PROPOFOL ;  Surgeon: Toledo, Ladell POUR, MD;  Location: [facility] END[facility];  Service: Gastroenterology;  Laterality: N/A;   EYE SURGERY     GIVENS CAPSULE STUDY  HERNIA REPAIR     umbilical   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   JOINT REPLACEMENT Bilateral    total hip replacement   REPLACEMENT TOTAL KNEE Right 02/09/2023   TUBAL LIGATION     Family History  Problem Relation Age of Onset   Hypertension Mother     Dementia Mother    Arthritis Mother    Other Father 8       Sepsis   Arthritis Father    Breast cancer Maternal Aunt 8   Arthritis Sister    Asthma Sister    Arthritis Sister    Asthma Sister    Social History   Occupational History   Occupation: retired  Tobacco Use   Smoking status: Never   Smokeless tobacco: Never  Vaping Use   Vaping status: Never Used  Substance and Sexual Activity   Alcohol use: No   Drug use: No   Sexual activity: Not Currently    Birth control/protection: I.U.D.   Tobacco Counseling Counseling given: Not Answered  SDOH Screenings   Food Insecurity: No Food Insecurity (09/09/2024)  Recent Concern: Food Insecurity - Food Insecurity Present (08/05/2024)  Housing: Unknown (09/09/2024)  Transportation Needs: No Transportation Needs (09/09/2024)  Utilities: Not At Risk (10/07/2024)  Alcohol Screen: Low Risk  (10/07/2024)  Depression (PHQ2-9): Low Risk  (10/07/2024)  Financial Resource Strain: Medium Risk (09/09/2024)  Physical Activity: Inactive (09/09/2024)  Social Connections: Moderately Isolated (09/09/2024)  Stress: No Stress Concern Present (09/09/2024)  Tobacco Use: Low Risk  (10/07/2024)  Health Literacy: Adequate Health Literacy (10/07/2024)   See flowsheets for full screening details  Depression Screen PHQ 2 & 9 Depression Scale- Over the past 2 weeks, how often have you been bothered by any of the following problems? Little interest or pleasure in doing things: 0 Feeling down, depressed, or hopeless (PHQ Adolescent also includes...irritable): 0 PHQ-2 Total Score: 0 Trouble falling or staying asleep, or sleeping too much: 0 Feeling tired or having little energy: 1 Poor appetite or overeating (PHQ Adolescent also includes...weight loss): 0 Feeling bad about yourself - or that you are a failure or have let yourself or your family down: 0 Trouble concentrating on things, such as reading the newspaper or watching television (PHQ Adolescent also  includes...like school work): 0 Moving or speaking so slowly that other people could have noticed. Or the opposite - being so fidgety or restless that you have been moving around a lot more than usual: 0 Thoughts that you would be better off dead, or of hurting yourself in some way: 0 PHQ-9 Total Score: 1 If you checked off any problems, how difficult have these problems made it for you to do your work, take care of things at home, or get along with other people?: Somewhat difficult  Depression Treatment Depression Interventions/Treatment : EYV7-0 Score <4 Follow-up Not Indicated     Goals Addressed               This Visit's Progress     Patient Stated (pt-stated)   Not on track     Drink more water and lose weight       Visit info / Clinical Intake: Medicare Wellness Visit Type:: Subsequent Annual Wellness Visit Persons participating in visit:: patient Medicare Wellness Visit Mode:: In-person (required for WTM) Information given by:: patient Interpreter Needed?: No Pre-visit prep was completed: yes AWV questionnaire completed by patient prior to visit?: no Living arrangements:: (!) lives alone Patient's Overall Health Status Rating: ROLLEN)  fair Typical amount of pain: some Does pain affect daily life?: (!) yes (Can be hard to walk at times.) Are you currently prescribed opioids?: no  Dietary Habits and Nutritional Risks How many meals a day?: 2 Eats fruit and vegetables daily?: (!) no Most meals are obtained by: preparing own meals In the last 2 weeks, have you had any of the following?: none Diabetic:: no  Functional Status Activities of Daily Living (to include ambulation/medication): Independent Ambulation: Independent Medication Administration: Independent Home Management: Independent Manage your own finances?: yes Primary transportation is: driving Concerns about vision?: no *vision screening is required for WTM* Concerns about hearing?: no  Fall  Screening Falls in the past year?: 0 Number of falls in past year: 0 Was there an injury with Fall?: 0 Fall Risk Category Calculator: 0 Patient Fall Risk Level: Low Fall Risk  Fall Risk Patient at Risk for Falls Due to: No Fall Risks Fall risk Follow up: Falls evaluation completed  Home and Transportation Safety: All rugs have non-skid backing?: yes (Not trip hazards.) All stairs or steps have railings?: yes Grab bars in the bathtub or shower?: (!) no Have non-skid surface in bathtub or shower?: (!) no Good home lighting?: yes Regular seat belt use?: yes Hospital stays in the last year:: no  Cognitive Assessment Difficulty concentrating, remembering, or making decisions? : yes (Can forget names but not often) Will 6CIT or Mini Cog be Completed: yes What year is it?: 0 points What month is it?: 0 points Give patient an address phrase to remember (5 components): [address] [place] [address] About what time is it?: 0 points Count backwards from 20 to 1: 0 points Say the months of the year in reverse: 0 points Repeat the address phrase from earlier: 2 points 6 CIT Score: 2 points  Advance Directives (For Healthcare) Does Patient Have a Medical Advance Directive?: No Would patient like information on creating a medical advance directive?: Yes (MAU/Ambulatory/Procedural Areas - Information given)  Reviewed/Updated  Reviewed/Updated: Reviewed All (Medical, Surgical, Family, Medications, Allergies, Care Teams, Patient Goals); Medical History; Surgical History; Family History; Medications; Allergies; Care Teams; Patient Goals        Objective:    Today's Vitals   10/07/24 0906 10/07/24 0936  BP: (!) 155/78 136/79  Pulse: 74   Resp: 17   Temp: 98.4 F (36.9 C)   TempSrc: Oral   SpO2: 99%   Weight: 196 lb 9.6 oz (89.2 kg)   Height: 5' 4.49 (1.638 m)   PainSc: 0-No pain    Body mass index is 33.24 kg/m.  Current Medications (verified) Outpatient Encounter  Medications as of 10/07/2024  Medication Sig   albuterol  (VENTOLIN  HFA) 108 (90 Base) MCG/ACT inhaler Inhale 1-2 puffs into the lungs every 6 (six) hours as needed for wheezing or shortness of breath.   ALPRAZolam  (XANAX ) 0.5 MG tablet Take one tablet (0.5 MG) by mouth 30 minutes prior to MRI, if needed may repeat dose 10 minutes prior to MRI.   apixaban (ELIQUIS) 5 MG TABS tablet Take 5 mg by mouth 2 (two) times daily. Am and bedtime   azelastine  (ASTELIN ) 0.1 % nasal spray Place 2 sprays into both nostrils 2 (two) times daily. Use in each nostril as directed   diltiazem  (CARDIZEM  CD) 240 MG 24 hr capsule Take 240 mg by mouth daily.   DULoxetine  (CYMBALTA ) 60 MG capsule Take 1 capsule (60 mg total) by mouth daily.   fluticasone  furoate-vilanterol (BREO ELLIPTA ) 200-25 MCG/ACT AEPB Inhale  1 puff into the lungs daily.   hydrochlorothiazide (HYDRODIURIL) 25 MG tablet Take 25 mg by mouth daily. am   Hyoscyamine Sulfate SL 0.125 MG SUBL Place 0.125 mg under the tongue every 6 (six) hours as needed. Place 1 tablet (0.125 mg total) under the tongue every 6 (six) hours as needed for Cramping   LINZESS  72 MCG capsule Take 1 capsule (72 mcg total) by mouth every morning.   ondansetron  (ZOFRAN -ODT) 4 MG disintegrating tablet Take 1 tablet (4 mg total) by mouth every 8 (eight) hours as needed for up to 10 days for nausea or vomiting.   rosuvastatin  (CRESTOR ) 20 MG tablet Take 1 tablet (20 mg total) by mouth daily.   spironolactone (ALDACTONE) 25 MG tablet Take 25 mg by mouth daily.   No facility-administered encounter medications on file as of 10/07/2024.   Hearing/Vision screen No results found. Immunizations and Health Maintenance Health Maintenance  Topic Date Due   COVID-19 Vaccine (7 - 2025-26 season) 10/20/2024 (Originally 07/08/2024)   Influenza Vaccine  10/23/2024 (Originally 06/07/2024)   Medicare Annual Wellness (AWV)  10/07/2025   Bone Density Scan  02/21/2027   DTaP/Tdap/Td (3 - Td or Tdap)  01/21/2030   Colonoscopy  08/19/2031   Pneumococcal Vaccine: 50+ Years  Completed   Hepatitis C Screening  Completed   Zoster Vaccines- Shingrix   Completed   Meningococcal B Vaccine  Aged Out   Mammogram  Discontinued        Assessment/Plan:  This is a routine wellness examination for [place].  Patient Care Team: Lema Heinkel T, NP as PCP - General (Nurse Practitioner) Rennie Cindy SAUNDERS, MD as Consulting Physician (Hematology and Oncology) Mevelyn Romero KATHEE DEVONNA (Gastroenterology) Ammon Blunt, MD as Consulting Physician (Cardiology) Theotis Lavelle BRAVO, MD as Referring Physician (Pulmonary Disease) Myrna Adine Anes, MD as Consulting Physician (Ophthalmology)  I have personally reviewed and noted the following in the patient's chart:   Medical and social history Use of alcohol, tobacco or illicit drugs  Current medications and supplements including opioid prescriptions. Functional ability and status Nutritional status Physical activity Advanced directives List of other physicians Hospitalizations, surgeries, and ER visits in previous 12 months Vitals Screenings to include cognitive, depression, and falls Referrals and appointments  No orders of the defined types were placed in this encounter.  In addition, I have reviewed and discussed with patient certain preventive protocols, quality metrics, and best practice recommendations. A written personalized care plan for preventive services as well as general preventive health recommendations were provided to patient.   Savina Olshefski T Thuan Tippett, NP   10/07/2024   Return in 1 year (on 10/07/2025) for as scheduled on February 6th.  After Visit Summary: (In Person-Printed) AVS printed and given to the patient

## 2024-10-07 NOTE — Assessment & Plan Note (Signed)
 Acute and improved.  Continue to monitor for symptoms and if any worsening alert PCP.

## 2024-10-09 ENCOUNTER — Other Ambulatory Visit

## 2024-10-09 ENCOUNTER — Other Ambulatory Visit: Payer: Self-pay | Admitting: Physician Assistant

## 2024-10-09 ENCOUNTER — Ambulatory Visit
Admission: RE | Admit: 2024-10-09 | Discharge: 2024-10-09 | Disposition: A | Discharge status: Home / Self Care | Payer: Medicare (Managed Care) | Source: Ambulatory Visit | Attending: Physician Assistant | Admitting: Physician Assistant

## 2024-10-09 DIAGNOSIS — I517 Cardiomegaly: Secondary | ICD-10-CM

## 2024-10-09 DIAGNOSIS — I480 Paroxysmal atrial fibrillation: Secondary | ICD-10-CM

## 2024-10-09 DIAGNOSIS — R0609 Other forms of dyspnea: Secondary | ICD-10-CM

## 2024-10-09 MED ORDER — GADOBUTROL 1 MMOL/ML IV SOLN
12.0000 mL | Freq: Once | INTRAVENOUS | Status: AC | PRN
  Administered 2024-10-09: 12 mL via INTRAVENOUS

## 2024-10-16 ENCOUNTER — Inpatient Hospital Stay: Payer: Medicare (Managed Care) | Attending: Internal Medicine

## 2024-10-16 VITALS — BP 127/80 | HR 89 | Temp 98.0°F | Resp 18

## 2024-10-16 DIAGNOSIS — D509 Iron deficiency anemia, unspecified: Secondary | ICD-10-CM | POA: Insufficient documentation

## 2024-10-16 DIAGNOSIS — E538 Deficiency of other specified B group vitamins: Secondary | ICD-10-CM | POA: Insufficient documentation

## 2024-10-16 MED ORDER — IRON SUCROSE 20 MG/ML IV SOLN
200.0000 mg | Freq: Once | INTRAVENOUS | Status: AC
  Administered 2024-10-16: 200 mg via INTRAVENOUS
  Filled 2024-10-16: qty 10

## 2024-10-16 MED ORDER — CYANOCOBALAMIN 1000 MCG/ML IJ SOLN
1000.0000 ug | Freq: Once | INTRAMUSCULAR | Status: AC
  Administered 2024-10-16: 1000 ug via INTRAMUSCULAR
  Filled 2024-10-16: qty 1

## 2024-10-16 NOTE — Patient Instructions (Signed)
 Iron Sucrose Injection What is this medication? IRON SUCROSE (EYE ern SOO krose) treats low levels of iron (iron deficiency anemia) in people with kidney disease. Iron is a mineral that plays an important role in making red blood cells, which carry oxygen from your lungs to the rest of your body. This medicine may be used for other purposes; ask your health care provider or pharmacist if you have questions. COMMON BRAND NAME(S): Venofer What should I tell my care team before I take this medication? They need to know if you have any of these conditions: Anemia not caused by low iron levels Heart disease High levels of iron in the blood Kidney disease Liver disease An unusual or allergic reaction to iron, other medications, foods, dyes, or preservatives Pregnant or trying to get pregnant Breastfeeding How should I use this medication? This medication is infused into a vein. It is given by your care team in a hospital or clinic setting. Talk to your care team about the use of this medication in children. While it may be prescribed for children as young as 2 years for selected conditions, precautions do apply. Overdosage: If you think you have taken too much of this medicine contact a poison control center or emergency room at once. NOTE: This medicine is only for you. Do not share this medicine with others. What if I miss a dose? Keep appointments for follow-up doses. It is important not to miss your dose. Call your care team if you are unable to keep an appointment. What may interact with this medication? Do not take this medication with any of the following: Deferoxamine Dimercaprol Other iron products This medication may also interact with the following: Chloramphenicol Deferasirox This list may not describe all possible interactions. Give your health care provider a list of all the medicines, herbs, non-prescription drugs, or dietary supplements you use. Also tell them if you smoke,  drink alcohol, or use illegal drugs. Some items may interact with your medicine. What should I watch for while using this medication? Visit your care team for regular checks on your progress. Tell your care team if your symptoms do not start to get better or if they get worse. You may need blood work done while you are taking this medication. You may need to eat more foods that contain iron. Talk to your care team. Foods that contain iron include whole grains or cereals, dried fruits, beans, peas, leafy green vegetables, and organ meats (liver, kidney). What side effects may I notice from receiving this medication? Side effects that you should report to your care team as soon as possible: Allergic reactions--skin rash, itching, hives, swelling of the face, lips, tongue, or throat Low blood pressure--dizziness, feeling faint or lightheaded, blurry vision Shortness of breath Side effects that usually do not require medical attention (report to your care team if they continue or are bothersome): Flushing Headache Joint pain Muscle pain Nausea Pain, redness, or irritation at injection site This list may not describe all possible side effects. Call your doctor for medical advice about side effects. You may report side effects to FDA at 1-800-FDA-1088. Where should I keep my medication? This medication is given in a hospital or clinic. It will not be stored at home. NOTE: This sheet is a summary. It may not cover all possible information. If you have questions about this medicine, talk to your doctor, pharmacist, or health care provider.  2024 Elsevier/Gold Standard (2023-06-14 00:00:00)Vitamin B12 Injection What is this medication? Vitamin B12 (  VAHY tuh min B12) prevents and treats low vitamin B12 levels in your body. It is used in people who do not get enough vitamin B12 from their diet or when their digestive tract does not absorb enough. Vitamin B12 plays an important role in maintaining the  health of your nervous system and red blood cells. This medicine may be used for other purposes; ask your health care provider or pharmacist if you have questions. COMMON BRAND NAME(S): B-12 Compliance Kit, B-12 Injection Kit, Cyomin, Dodex, LA-12, Nutri-Twelve, Physicians EZ Use B-12, Primabalt, Vitamin Deficiency Injectable System - B12 What should I tell my care team before I take this medication? They need to know if you have any of these conditions: Kidney disease Leber's disease Megaloblastic anemia An unusual or allergic reaction to cyanocobalamin, cobalt, other medications, foods, dyes, or preservatives Pregnant or trying to get pregnant Breast-feeding How should I use this medication? This medication is injected into a muscle or deeply under the skin. It is usually given in a clinic or care team's office. However, your care team may teach you how to inject yourself. Follow all instructions. Talk to your care team about the use of this medication in children. Special care may be needed. Overdosage: If you think you have taken too much of this medicine contact a poison control center or emergency room at once. NOTE: This medicine is only for you. Do not share this medicine with others. What if I miss a dose? If you are given your dose at a clinic or care team's office, call to reschedule your appointment. If you give your own injections, and you miss a dose, take it as soon as you can. If it is almost time for your next dose, take only that dose. Do not take double or extra doses. What may interact with this medication? Alcohol Colchicine This list may not describe all possible interactions. Give your health care provider a list of all the medicines, herbs, non-prescription drugs, or dietary supplements you use. Also tell them if you smoke, drink alcohol, or use illegal drugs. Some items may interact with your medicine. What should I watch for while using this medication? Visit your  care team regularly. You may need blood work done while you are taking this medication. You may need to follow a special diet. Talk to your care team. Limit your alcohol intake and avoid smoking to get the best benefit. What side effects may I notice from receiving this medication? Side effects that you should report to your care team as soon as possible: Allergic reactions--skin rash, itching, hives, swelling of the face, lips, tongue, or throat Swelling of the ankles, hands, or feet Trouble breathing Side effects that usually do not require medical attention (report to your care team if they continue or are bothersome): Diarrhea This list may not describe all possible side effects. Call your doctor for medical advice about side effects. You may report side effects to FDA at 1-800-FDA-1088. Where should I keep my medication? Keep out of the reach of children. Store at room temperature between 15 and 30 degrees C (59 and 85 degrees F). Protect from light. Throw away any unused medication after the expiration date. NOTE: This sheet is a summary. It may not cover all possible information. If you have questions about this medicine, talk to your doctor, pharmacist, or health care provider.  2024 Elsevier/Gold Standard (2021-07-06 00:00:00)

## 2024-11-20 ENCOUNTER — Encounter: Payer: Self-pay | Admitting: Nurse Practitioner

## 2024-12-07 NOTE — Patient Instructions (Incomplete)
 Healthy Eating for Your Heart Many factors influence your heart health, including eating and exercise habits. Heart health is also called coronary health. Coronary risk increases with abnormal blood fat (lipid) levels. A heart-healthy eating plan includes limiting unhealthy fats, increasing healthy fats, limiting salt (sodium) intake, and making other diet and lifestyle changes. What is my plan? Your health care provider may recommend that: You limit your fat intake to _________% or less of your total calories each day. You limit your saturated fat intake to _________% or less of your total calories each day. You limit the amount of cholesterol in your diet to less than _________ mg per day. You limit the amount of sodium in your diet to less than _________ mg per day. What are tips for following this plan? Cooking Cook foods using methods other than frying. Baking, boiling, grilling, and broiling are all good options. Other ways to reduce fat include: Removing the skin from poultry. Removing all visible fats from meats. Steaming vegetables in water or broth. Meal planning  At meals, imagine dividing your plate into fourths: Fill one-half of your plate with vegetables and green salads. Fill one-fourth of your plate with whole grains. Fill one-fourth of your plate with lean protein foods. Eat 2-4 cups of vegetables per day. One cup of vegetables equals 1 cup (91 g) broccoli or cauliflower florets, 2 medium carrots, 1 large bell pepper, 1 large sweet potato, 1 large tomato, 1 medium white potato, 2 cups (150 g) raw leafy greens. Eat 1-2 cups of fruit per day. One cup of fruit equals 1 small apple, 1 large banana, 1 cup (237 g) mixed fruit, 1 large orange,  cup (82 g) dried fruit, 1 cup (240 mL) 100% fruit juice. Eat more foods that contain soluble fiber. Examples include apples, broccoli, carrots, beans, peas, and barley. Aim to get 25-30 g of fiber per day. Increase your consumption of  legumes, nuts, and seeds to 4-5 servings per week. One serving of dried beans or legumes equals  cup (90 g) cooked, 1 serving of nuts is  oz (12 almonds, 24 pistachios, or 7 walnut halves), and 1 serving of seeds equals  oz (8 g). Fats Choose healthy fats more often. Choose monounsaturated and polyunsaturated fats, such as olive and canola oils, avocado oil, flaxseeds, walnuts, almonds, and seeds. Eat more omega-3 fats. Choose salmon, mackerel, sardines, tuna, flaxseed oil, and ground flaxseeds. Aim to eat fish at least 2 times each week. Check food labels carefully to identify foods with trans fats or high amounts of saturated fat. Limit saturated fats. These are found in animal products, such as meats, butter, and cream. Plant sources of saturated fats include palm oil, palm kernel oil, and coconut oil. Avoid foods with partially hydrogenated oils in them. These contain trans fats. Examples are stick margarine, some tub margarines, cookies, crackers, and other baked goods. Avoid fried foods. General information Eat more home-cooked food and less restaurant, buffet, and fast food. Limit or avoid alcohol. Limit foods that are high in added sugar and simple starches such as foods made using white refined flour (white breads, pastries, sweets). Lose weight if you are overweight. Losing just 5-10% of your body weight can help your overall health and prevent diseases such as diabetes and heart disease. Monitor your sodium intake, especially if you have high blood pressure. Talk with your health care provider about your sodium intake. Try to incorporate more vegetarian meals weekly. What foods should I eat? Fruits All fresh, canned (  in natural juice), or frozen fruits. Vegetables Fresh or frozen vegetables (raw, steamed, roasted, or grilled). Green salads. Grains Most grains. Choose whole wheat and whole grains most of the time. Rice and pasta, including brown rice and pastas made with whole  wheat. Meats and other proteins Lean, well-trimmed beef, veal, pork, and lamb. Chicken and turkey without skin. All fish and shellfish. Wild duck, rabbit, pheasant, and venison. Egg whites or low-cholesterol egg substitutes. Dried beans, peas, lentils, and tofu. Seeds and most nuts. Dairy Low-fat or nonfat cheeses, including ricotta and mozzarella. Skim or 1% milk (liquid, powdered, or evaporated). Buttermilk made with low-fat milk. Nonfat or low-fat yogurt. Fats and oils Non-hydrogenated (trans-free) margarines. Vegetable oils, including soybean, sesame, sunflower, olive, avocado, peanut, safflower, corn, canola, and cottonseed. Salad dressings or mayonnaise made with a vegetable oil. Beverages Water (mineral or sparkling). Coffee and tea. Unsweetened ice tea. Diet beverages. Sweets and desserts Sherbet, gelatin, and fruit ice. Small amounts of dark chocolate. Limit all sweets and desserts. Seasonings and condiments All seasonings and condiments. The items listed above may not be a complete list of foods and beverages you can eat. Contact a dietitian for more options. What foods should I avoid? Fruits Canned fruit in heavy syrup. Fruit in cream or butter sauce. Fried fruit. Limit coconut. Vegetables Vegetables cooked in cheese, cream, or butter sauce. Fried vegetables. Grains Breads made with saturated or trans fats, oils, or whole milk. Croissants. Sweet rolls. Donuts. High-fat crackers, such as cheese crackers and chips. Meats and other proteins Fatty meats, such as hot dogs, ribs, sausage, bacon, rib-eye roast or steak. High-fat deli meats, such as salami and bologna. Caviar. Domestic duck and goose. Organ meats, such as liver. Dairy Cream, sour cream, cream cheese, and creamed cottage cheese. Whole-milk cheeses. Whole or 2% milk (liquid, evaporated, or condensed). Whole buttermilk. Cream sauce or high-fat cheese sauce. Whole-milk yogurt. Fats and oils Meat fat, or shortening. Cocoa  butter, hydrogenated oils, palm oil, coconut oil, palm kernel oil. Solid fats and shortenings, including bacon fat, salt pork, lard, and butter. Nondairy cream substitutes. Salad dressings with cheese or sour cream. Beverages Regular sodas and any drinks with added sugar. Sweets and desserts Frosting. Pudding. Cookies. Cakes. Pies. Milk chocolate or white chocolate. Buttered syrups. Full-fat ice cream or ice cream drinks. The items listed above may not be a complete list of foods and beverages to avoid. Contact a dietitian for more information. Summary Heart-healthy meal planning includes limiting unhealthy fats, increasing healthy fats, limiting salt (sodium) intake and making other diet and lifestyle changes. Lose weight if you are overweight. Losing just 5-10% of your body weight can help your overall health and prevent diseases such as diabetes and heart disease. Focus on eating a balance of foods, including fruits and vegetables, low-fat or nonfat dairy, lean protein, nuts and legumes, whole grains, and heart-healthy oils and fats. This information is not intended to replace advice given to you by your health care provider. Make sure you discuss any questions you have with your health care provider. Document Revised: 09/02/2024 Document Reviewed: 11/29/2021 [employer] Patient Education  2025 [employer].

## 2024-12-11 ENCOUNTER — Ambulatory Visit: Admitting: Nurse Practitioner

## 2024-12-11 DIAGNOSIS — E7800 Pure hypercholesterolemia, unspecified: Secondary | ICD-10-CM

## 2024-12-11 DIAGNOSIS — J4520 Mild intermittent asthma, uncomplicated: Secondary | ICD-10-CM

## 2024-12-11 DIAGNOSIS — M797 Fibromyalgia: Secondary | ICD-10-CM

## 2024-12-11 DIAGNOSIS — I480 Paroxysmal atrial fibrillation: Secondary | ICD-10-CM

## 2024-12-11 DIAGNOSIS — D508 Other iron deficiency anemias: Secondary | ICD-10-CM

## 2024-12-11 DIAGNOSIS — I10 Essential (primary) hypertension: Secondary | ICD-10-CM

## 2024-12-11 DIAGNOSIS — E559 Vitamin D deficiency, unspecified: Secondary | ICD-10-CM

## 2024-12-11 DIAGNOSIS — E538 Deficiency of other specified B group vitamins: Secondary | ICD-10-CM

## 2024-12-11 DIAGNOSIS — Z23 Encounter for immunization: Secondary | ICD-10-CM

## 2024-12-11 DIAGNOSIS — E079 Disorder of thyroid, unspecified: Secondary | ICD-10-CM

## 2024-12-13 ENCOUNTER — Ambulatory Visit: Admitting: Nurse Practitioner

## 2025-01-13 ENCOUNTER — Ambulatory Visit: Admitting: Internal Medicine

## 2025-01-13 ENCOUNTER — Other Ambulatory Visit

## 2025-01-13 ENCOUNTER — Ambulatory Visit

## 2025-02-11 ENCOUNTER — Ambulatory Visit: Admitting: Nurse Practitioner
